# Patient Record
Sex: Female | Born: 1980
Health system: Southern US, Community
[De-identification: ages and names within clinical notes are randomized; demographics above are authoritative.]

## PROBLEM LIST (undated history)

## (undated) DIAGNOSIS — T4145XA Adverse effect of unspecified anesthetic, initial encounter: Secondary | ICD-10-CM

## (undated) DIAGNOSIS — Z8489 Family history of other specified conditions: Secondary | ICD-10-CM

## (undated) DIAGNOSIS — R112 Nausea with vomiting, unspecified: Secondary | ICD-10-CM

## (undated) DIAGNOSIS — N946 Dysmenorrhea, unspecified: Secondary | ICD-10-CM

## (undated) DIAGNOSIS — K589 Irritable bowel syndrome without diarrhea: Secondary | ICD-10-CM

## (undated) DIAGNOSIS — N979 Female infertility, unspecified: Secondary | ICD-10-CM

## (undated) DIAGNOSIS — T8859XA Other complications of anesthesia, initial encounter: Secondary | ICD-10-CM

## (undated) DIAGNOSIS — D649 Anemia, unspecified: Secondary | ICD-10-CM

## (undated) DIAGNOSIS — E221 Hyperprolactinemia: Secondary | ICD-10-CM

## (undated) DIAGNOSIS — Z9889 Other specified postprocedural states: Secondary | ICD-10-CM

## (undated) DIAGNOSIS — F419 Anxiety disorder, unspecified: Secondary | ICD-10-CM

## (undated) DIAGNOSIS — O24419 Gestational diabetes mellitus in pregnancy, unspecified control: Secondary | ICD-10-CM

## (undated) DIAGNOSIS — J45909 Unspecified asthma, uncomplicated: Secondary | ICD-10-CM

## (undated) DIAGNOSIS — N809 Endometriosis, unspecified: Secondary | ICD-10-CM

## (undated) HISTORY — DX: Irritable bowel syndrome, unspecified: K58.9

## (undated) HISTORY — PX: OTHER SURGICAL HISTORY: SHX169

## (undated) HISTORY — DX: Female infertility, unspecified: N97.9

## (undated) HISTORY — DX: Anemia, unspecified: D64.9

## (undated) HISTORY — PX: LAPAROSCOPY: SHX197

## (undated) HISTORY — PX: GANGLION CYST EXCISION: SHX1691

## (undated) HISTORY — DX: Dysmenorrhea, unspecified: N94.6

## (undated) HISTORY — DX: Anxiety disorder, unspecified: F41.9

## (undated) HISTORY — DX: Unspecified asthma, uncomplicated: J45.909

## (undated) HISTORY — DX: Endometriosis, unspecified: N80.9

## (undated) HISTORY — DX: Hyperprolactinemia: E22.1

## (undated) HISTORY — DX: Gestational diabetes mellitus in pregnancy, unspecified control: O24.419

---
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
## 2010-03-22 ENCOUNTER — Ambulatory Visit: Payer: Self-pay

## 2010-11-21 HISTORY — PX: CHROMOPERTUBATION: SHX6288

## 2011-02-14 ENCOUNTER — Ambulatory Visit: Payer: Self-pay | Admitting: General Practice

## 2011-04-11 ENCOUNTER — Ambulatory Visit: Payer: Self-pay | Admitting: Obstetrics and Gynecology

## 2011-04-14 ENCOUNTER — Ambulatory Visit: Payer: Self-pay | Admitting: Obstetrics and Gynecology

## 2011-12-15 ENCOUNTER — Encounter: Payer: Self-pay | Admitting: Maternal and Fetal Medicine

## 2011-12-31 IMAGING — US US PELV - US TRANSVAGINAL
1 series · 17 of 25 positions shown · non-contrast
Comparison: none

REASON FOR EXAM: STAT CR [id number]  pelvic pain unusual menstral cycle
COMMENTS:

PROCEDURE:     US  - US PELVIS EXAM W/TRANSVAGINAL  - February 14, 2011  [DATE]
RESULT:     Pelvic ultrasound dated 02/14/2011.
Transabdominal and endovaginal imaging of the pelvis was obtained.

[Series 1: us pelv - us transvaginal · 17 of 35 slices shown]
[im 1/35]
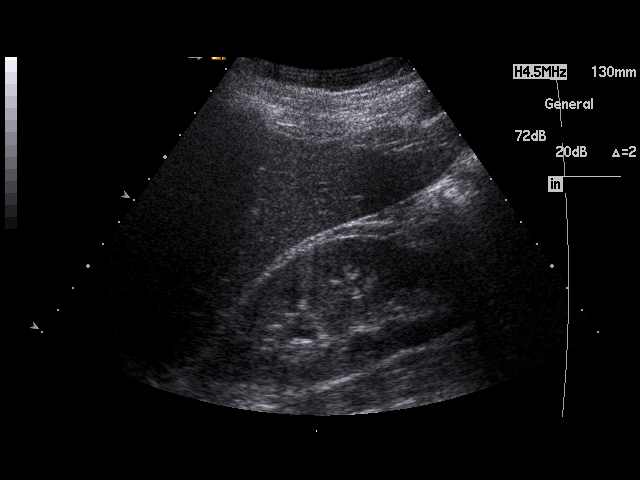
[im 3/35]
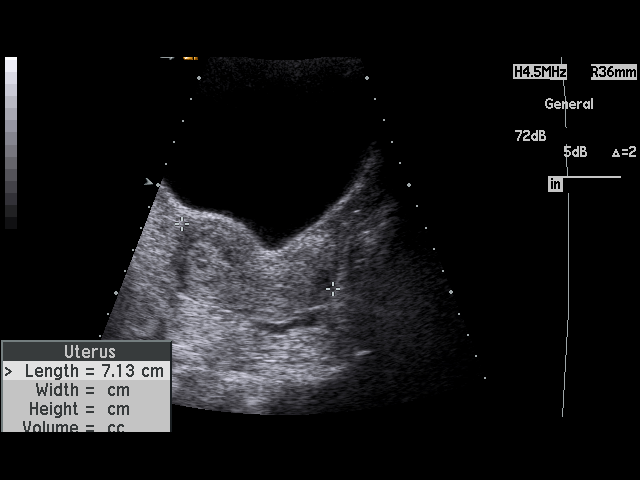
[im 5/35]
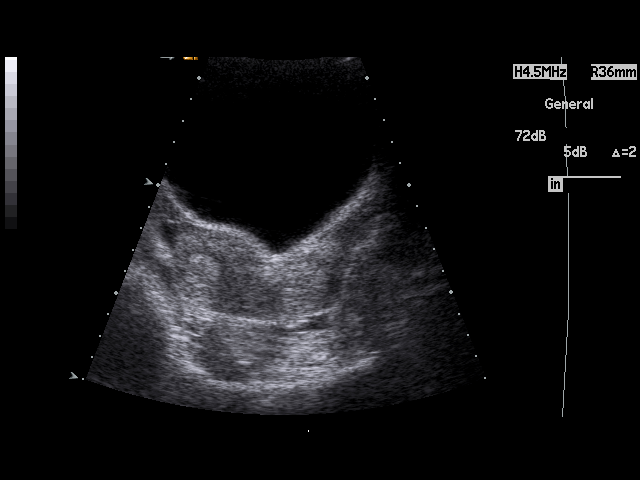
[im 8/35]
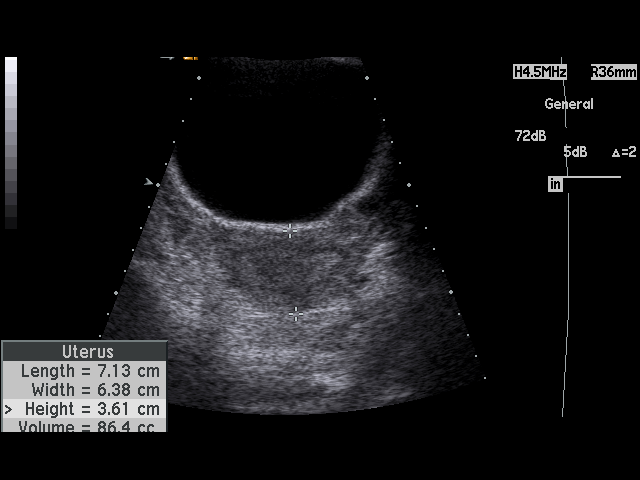
[im 9/35]
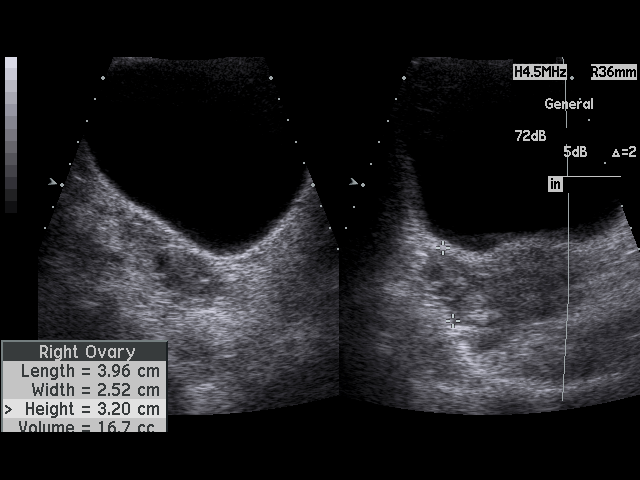
[im 12/35]
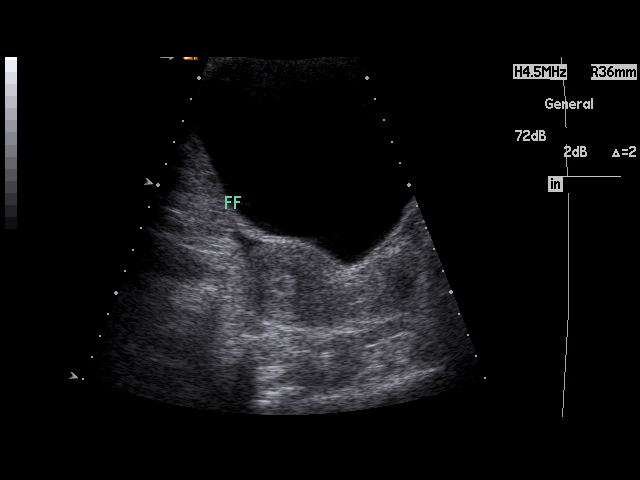
[im 13/35]
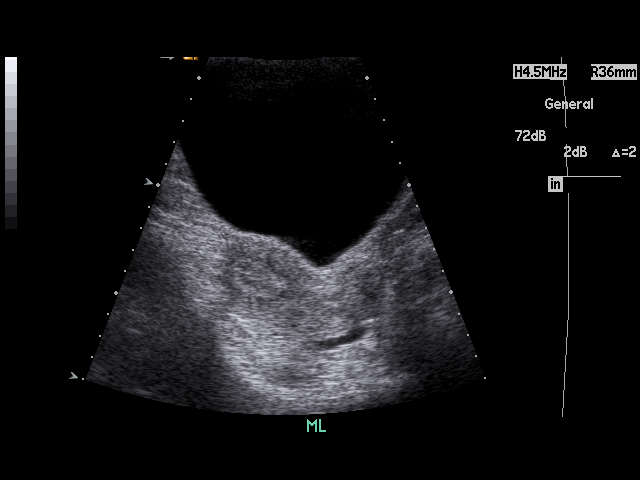
[im 16/35]
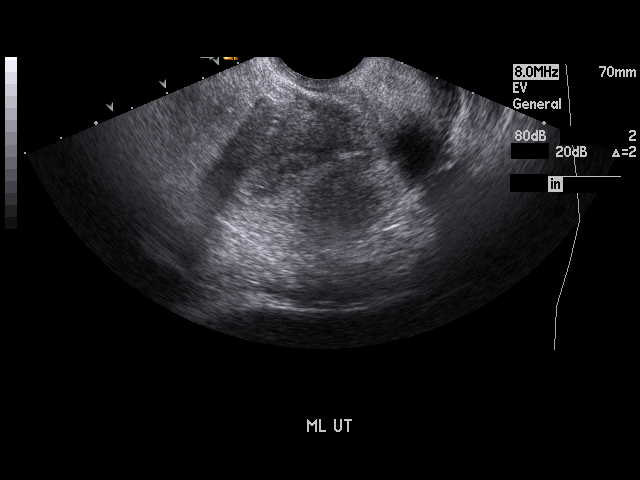
[im 18/35]
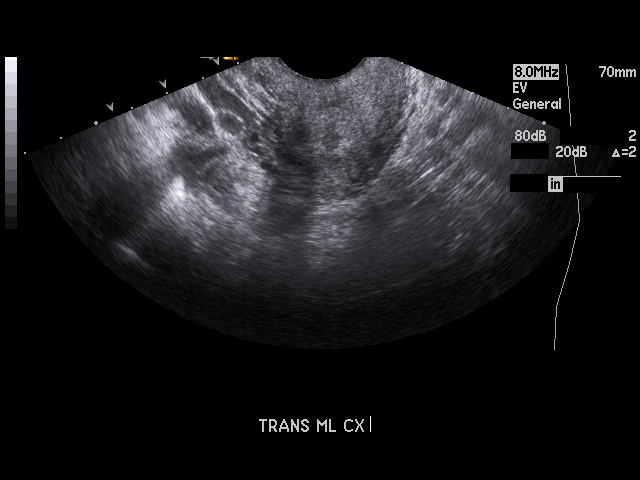
[im 19/35]
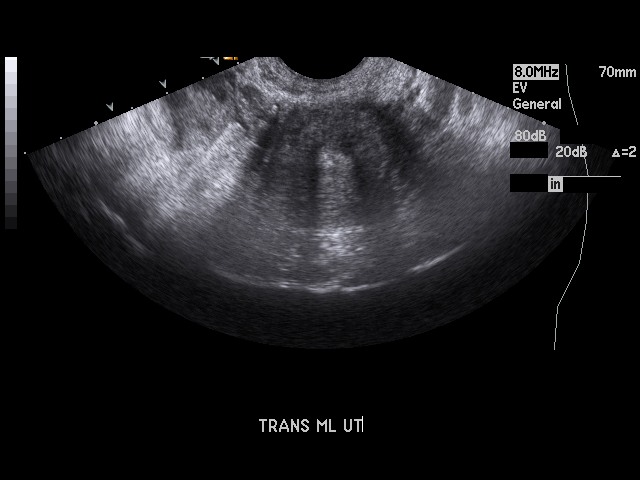
[im 22/35]
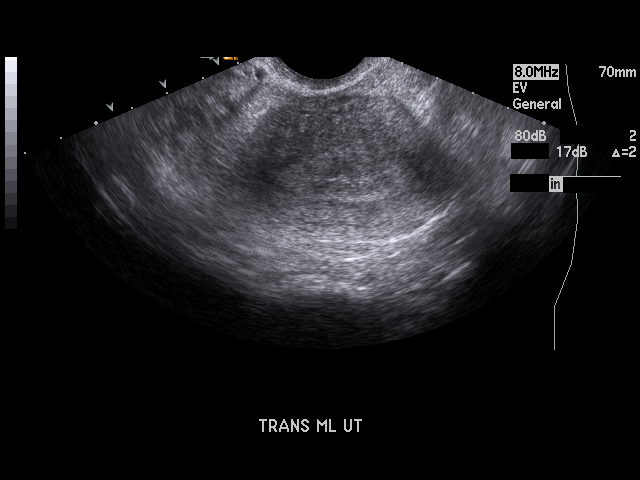
[im 23/35]
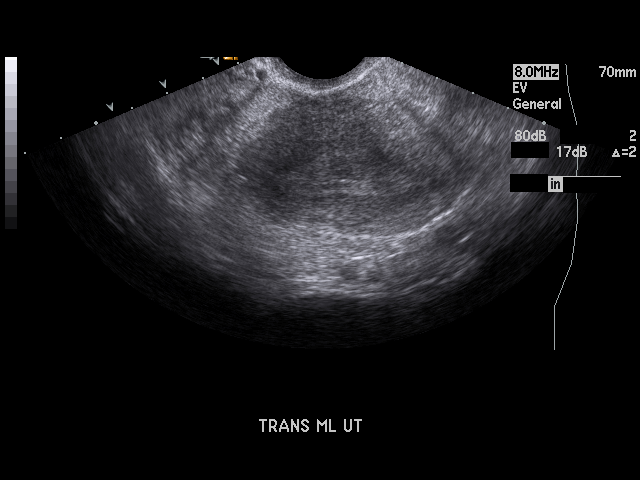
[im 26/35]
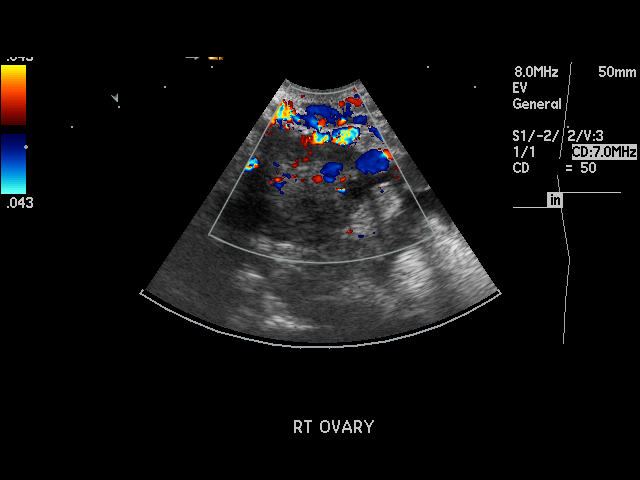
[im 27/35]
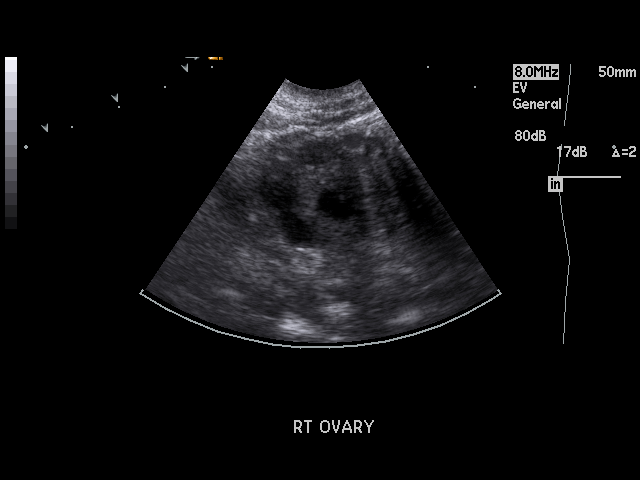
[im 30/35]
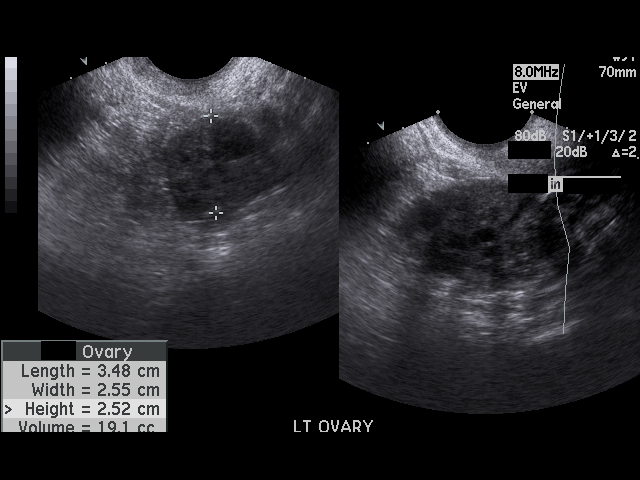
[im 32/35]
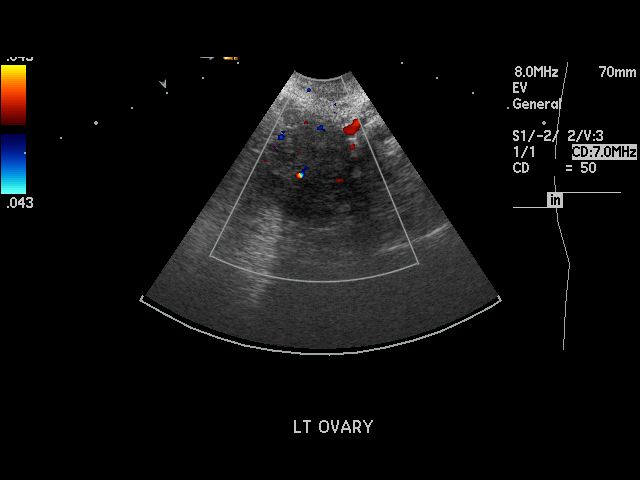
[im 35/35]
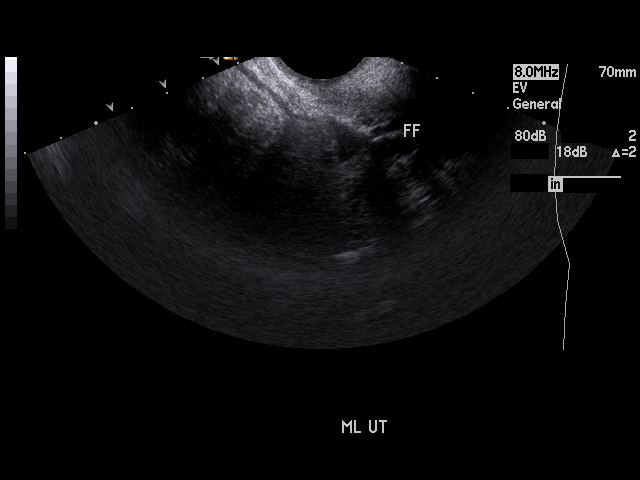

[17 of 25 positions shown; findings below may reference images not displayed]

FINDINGS: The uterus demonstrates a homogeneous is echotexture measuring
7.17 x 6.38 x 2.6 cm. The endometrial thickness is one 0.3 cm. There is a
focal central area of mild heterogeneity within the endometrial canal. This
may represent a component of blood particularly if the patient and is
actively menstruating. The right ovary measures 3.19 x 2.49 x 2.75 cm and
the left 4 x 2.62 x 2.46 cm. The ovaries demonstrate a homogeneous
echotexture. Color flow is identified within the right left ovaries. A trace
amount of fluid is identified within the cul-de-sac. There is no evidence of
hydronephrosis. Note not mentioned above follicles are identified within the
right and left ovaries.
IMPRESSION: Likely physiologic findings as described above the mild
heterogeneity within the endometrium may represent a component of small
bowel blood components or possibly simply physiologic endometrial changes
pre-, post, or during ministration.
2. Small amount of fluid within the cul-de-sac again likely physiologic.

## 2012-01-23 ENCOUNTER — Encounter: Payer: Self-pay | Admitting: Maternal and Fetal Medicine

## 2012-04-09 ENCOUNTER — Encounter: Payer: Self-pay | Admitting: Maternal and Fetal Medicine

## 2012-04-16 ENCOUNTER — Ambulatory Visit: Payer: Self-pay | Admitting: Obstetrics and Gynecology

## 2012-04-21 ENCOUNTER — Ambulatory Visit: Payer: Self-pay | Admitting: Obstetrics and Gynecology

## 2012-05-07 ENCOUNTER — Observation Stay: Payer: Self-pay | Admitting: Obstetrics and Gynecology

## 2012-05-07 LAB — URINALYSIS, COMPLETE
Bilirubin,UR: NEGATIVE
Blood: NEGATIVE
Glucose,UR: NEGATIVE mg/dL (ref 0–75)
Ketone: NEGATIVE
Protein: NEGATIVE
RBC,UR: <1 /HPF (ref 0–5)
Squamous Epithelial: 1
WBC UR: <1 /HPF (ref 0–5)

## 2012-05-07 LAB — FETAL FIBRONECTIN
Appearance: NORMAL
Fetal Fibronectin: NEGATIVE

## 2012-05-09 LAB — URINE CULTURE

## 2012-05-21 ENCOUNTER — Ambulatory Visit: Payer: Self-pay | Admitting: Obstetrics and Gynecology

## 2012-06-17 ENCOUNTER — Inpatient Hospital Stay: Payer: Self-pay | Admitting: Obstetrics and Gynecology

## 2012-06-17 LAB — CBC WITH DIFFERENTIAL/PLATELET
Basophil %: 0.2 %
Eosinophil %: 0.6 %
HCT: 33.2 % — ABNORMAL LOW (ref 35.0–47.0)
HGB: 11.7 g/dL — ABNORMAL LOW (ref 12.0–16.0)
Lymphocyte #: 2 10*3/uL (ref 1.0–3.6)
MCH: 32.5 pg (ref 26.0–34.0)
MCHC: 35.1 g/dL (ref 32.0–36.0)
MCV: 93 fL (ref 80–100)
Monocyte #: 0.9 x10 3/mm (ref 0.2–0.9)
Monocyte %: 9.3 %
Neutrophil #: 6.4 10*3/uL (ref 1.4–6.5)
Neutrophil %: 68.8 %
RBC: 3.59 10*6/uL — ABNORMAL LOW (ref 3.80–5.20)
WBC: 9.3 10*3/uL (ref 3.6–11.0)

## 2012-06-19 LAB — HEMATOCRIT: HCT: 29.4 % — ABNORMAL LOW (ref 35.0–47.0)

## 2012-07-27 ENCOUNTER — Ambulatory Visit: Payer: Self-pay | Admitting: Obstetrics and Gynecology

## 2012-08-21 ENCOUNTER — Ambulatory Visit: Payer: Self-pay | Admitting: Obstetrics and Gynecology

## 2012-11-07 ENCOUNTER — Ambulatory Visit: Payer: Self-pay | Admitting: General Practice

## 2015-02-06 ENCOUNTER — Ambulatory Visit: Payer: Self-pay | Admitting: Specialist

## 2015-02-25 LAB — HM PAP SMEAR: HM PAP: NEGATIVE

## 2015-03-10 NOTE — Op Note (Signed)
PATIENT NAME:  Diana Brooks, Diana Brooks MR#:  [id number] DATE OF BIRTH:  1981/02/28  DATE OF PROCEDURE:  06/18/2012  PREOPERATIVE DIAGNOSES:  1. 39.4 week intrauterine pregnancy, undelivered.  2. Gestational diabetes mellitus, diet controlled.  3. GBS positive bacteriuria.  4. Rh negative.  5. Anemia.   POSTOPERATIVE DIAGNOSES:  1. 39.4 week intrauterine pregnancy, delivered.  2. Gestational diabetes mellitus, diet controlled.  3. GBS bacteriuria, status post treatment in labor.  4. Rh negative.  5. Anemia.  6. Arrest of dilation.  7. Viable female infant 8 pounds 10 ounces.   OPERATIVE PROCEDURE: Primary low cervical transverse cesarean section.   SURGEON: Alanda Slim. Angelina Neece, MD  FIRST ASSISTANT: None.   ANESTHESIA: Spinal.   INDICATIONS: Patient is a 34 year old married white female gravida 1, para 0, EGA 39.4 weeks, who was admitted for induction of labor secondary to gestational diabetes mellitus and favorable cervix. Patient had other chronic issues as noted in the diagnoses. Patient underwent Cytotec/Pitocin induction of labor with subsequent amniotomy and introduction of IUPC. Patient developed adequate labor through [facility] unit assessment but had arrest of dilation at 7 cm, 90% effaced, -1 station. Despite adequate contractions she failed to progress over several hours and proceeded to move forward with cesarean section delivery.   FINDINGS AT SURGERY: Viable female infant 8 pounds 10 ounces having Apgars of 8 and 9 at one and five minutes, respectively. Uterus is notable for a 2 cm right fundal fibroid and uterine atony which responded to IM Methergine and PR Cytotec. Tubes and ovaries were grossly normal.   DESCRIPTION OF PROCEDURE: Patient was brought to the Operating Room where she was placed in the sitting position. Spinal anesthetic was introduced without difficulty. She was placed in the supine position with a right lateral hip roll in place. A ChloraPrep abdominal prep and  drape was performed in the standard fashion. A Foley catheter was draining clear yellow urine from the bladder. After checking for adequate level of anesthesia, a Pfannenstiel incision was made to the abdomen. The fascia was incised transversely and extended bilaterally with Mayo scissors. The midline raphe was incised, separated and the peritoneum was entered. Bladder flap was created through sharp dissection with Metzenbaum. A low transverse incision was made in the uterus and this was extended bluntly bilaterally. The infant was delivered through a vertex presentation and was vigorous at birth. Oropharynx, nasopharynx were bulb suctioned. The umbilical cord was triply clamped and cut and the infant was handed off to the awaiting resuscitation team. Cord blood sampling was obtained. Placenta was expressed from the uterus. The uterus was externalized onto the anterior abdominal wall. It was cleared of all debris with laps. Uterine atony was encountered and the patient was then given 0.2 mg of Methergine IM as well as 1000 mcg of Cytotec PR which improved the status of uterine atony. The uterine incision was closed in two layers using #1 chromic suture. The first layer was a running locking stitch, second layer was an imbricating layer. Bladder flap was reapproximated using 2-0 Vicryl suture in a simple running manner. After assessment of the pelvis and check on hemostasis the uterus was placed back into the abdominopelvic cavity. Gutters were cleared of all debris with laps. The incision was closed in layers with 0 Maxon being used on the fascia in a simple running manner. Skin was closed with staples. Pressure dressing was applied. Patient was then mobilized and taken to recovery room in satisfactory condition.   ESTIMATED BLOOD LOSS: 750 mL.  IV FLUIDS INFUSED: 1000 mL.    ____________________________ Alanda Slim. Efrem Pitstick, MD mad:cms D: 06/19/2012 12:34:01 ET T: 06/19/2012 12:54:38  ET JOB#: [id number]  cc: Hassell Done A. Azzure Garabedian, MD, <Dictator>  Alanda Slim Jajuan Skoog MD ELECTRONICALLY SIGNED 06/25/2012 14:22

## 2015-03-16 LAB — SURGICAL PATHOLOGY

## 2015-03-22 NOTE — Op Note (Signed)
PATIENT NAME:  Diana Brooks, DITMER MR#:  [id number] DATE OF BIRTH:  Apr 19, 1981  DATE OF PROCEDURE:  02/06/2015  PREOPERATIVE DIAGNOSIS: Dorsal left wrist ganglion.   POSTOPERATIVE DIAGNOSIS: Dorsal left wrist ganglion.   PROCEDURE: Excision of dorsal left wrist ganglion.   SURGEON: Christophe Louis, M.D.   ANESTHESIA: General.   COMPLICATIONS: None.   TOURNIQUET TIME: Approximately 30 minutes.   DESCRIPTION OF PROCEDURE: One gram of Ancef was given intravenously prior to the procedure. General anesthesia is induced. The left upper extremity is thoroughly prepped with alcohol and ChloraPrep and draped in standard sterile fashion. The extremity is wrapped out with the Esmarch bandage and pneumatic tourniquet elevated to 250 mmHg. Under loupe magnification, transverse incision is made at the wrist over the prominence of the ganglion and the dissection carried down to the retinaculum. The ganglion is seen to be protruding between the extensor pollicis longus and the common extensor tendons. These are reflected to each side with a retractor and preserved throughout the case. The ganglion sac is then completely dissected out under loupe magnification down to the dorsal wrist. A 1 cm in diameter portion of dorsal wrist capsule is removed along with the associated ganglion. The rongeur is used to remove any residual pieces of the ganglion sac. The wound is thoroughly irrigated multiple times. The retinaculum is repaired with several 5-0 Vicryl sutures. One subcutaneous 5-0 Vicryl suture is placed. The skin is closed with a running subcuticular 3-0 Prolene. Skin edges are infiltrated with 0.5% plain Xylocaine. A soft bulky dressing with a volar splint is applied. The patient is returned to the recovery room in satisfactory condition having tolerated the procedure quite well. ____________________________ Lucas Mallow, MD ces:sb D: 02/06/2015 08:47:39 ET T: 02/06/2015 08:57:05  ET JOB#: [id number]  cc: Lucas Mallow, MD, <Dictator> Lucas Mallow MD ELECTRONICALLY SIGNED 02/14/2015 13:03

## 2015-03-31 NOTE — H&P (Signed)
L&D Evaluation:  History:   HPI 34 year old white female G1P0 at 39.[redacted] weeks gestation, estimated date of confinement 06/22/2012, presents for Cytotec/Pitocin IOL secondary to GDM, favorable cervix.    Patient's Medical History GDM; Anemia; GBS Bacteruria; Endometriosis; EIA; IBS; Hyperprolactinemia    Patient's Surgical History Laparoscopic, Chromopertubation of Fallopian Tubes    Medications Pre Natal Vitamins    Allergies NKDA    Social History none    Family History Non-Contributory   ROS:   ROS All systems were reviewed.  HEENT, CNS, GI, GU, Respiratory, CV, Renal and Musculoskeletal systems were found to be normal.   Exam:   Vital Signs stable    Urine Protein negative dipstick    General no apparent distress    Mental Status clear    Chest clear    Heart normal sinus rhythm    Abdomen gravid, non-tender    Estimated Fetal Weight Average for gestational age, 8#6    Back no CVAT    Edema no edema    Pelvic no external lesions, 1-2/80/-2/VTX/bowi    FHT normal rate with no decels    Ucx regular    Skin dry    Lymph no lymphadenopathy    Other O-/Atb-/NR/RI/VI/HB-/HIV-/GBS+/Glucola 186/3 Hr GTT Abnormal   Impression:   Impression YIUP; Rh-; GBS Bacteruria; GDM   Plan:   Plan antibiotics for GBBS prophylaxis, Cytotec/Pitocin IOL    Comments Epidural prn   Electronic Signatures: Gunter Conde, Alanda Slim (MD)  (Signed 29-Jul-13 13:59)  Authored: L&D Evaluation   Last Updated: 29-Jul-13 13:59 by Lachanda Buczek, Alanda Slim (MD)

## 2015-10-20 ENCOUNTER — Ambulatory Visit: Payer: Self-pay | Admitting: Obstetrics and Gynecology

## 2015-10-22 ENCOUNTER — Encounter: Payer: Self-pay | Admitting: Obstetrics and Gynecology

## 2015-10-22 ENCOUNTER — Ambulatory Visit (INDEPENDENT_AMBULATORY_CARE_PROVIDER_SITE_OTHER): Payer: BLUE CROSS/BLUE SHIELD | Admitting: Obstetrics and Gynecology

## 2015-10-22 VITALS — BP 124/76 | HR 80 | Ht 64.0 in | Wt 160.0 lb

## 2015-10-22 DIAGNOSIS — N6009 Solitary cyst of unspecified breast: Secondary | ICD-10-CM | POA: Insufficient documentation

## 2015-10-22 DIAGNOSIS — N979 Female infertility, unspecified: Secondary | ICD-10-CM | POA: Diagnosis not present

## 2015-10-22 DIAGNOSIS — N809 Endometriosis, unspecified: Secondary | ICD-10-CM | POA: Insufficient documentation

## 2015-10-22 DIAGNOSIS — N6002 Solitary cyst of left breast: Secondary | ICD-10-CM

## 2015-10-22 NOTE — Progress Notes (Signed)
Patient ID: Diana Brooks, female   DOB: 1981/01/21, 34 y.o.   MRN: [id number] Infertility x 13 months H/o of endometrioisis Regular cycles- q 28days- last 5-6, 2-3 heavy- chanigng q 2 hours Cyst on left breast - tender  GYN ENCOUNTER NOTE  Subjective:       RALPH BRISKEY is a 34 y.o. G23P1001 female is here for gynecologic evaluation of the following issues:  1. Left nipple cyst. 2.  History of endometriosis. 3.  Secondary infertility   Gynecologic History Patient's last menstrual period was 10/17/2015 (exact date). Contraception: None Cycle interval 24-28 days  Obstetric History OB History  Gravida Para Term Preterm AB SAB TAB Ectopic Multiple Living  1 1 1       1     # Outcome Date GA Lbr Len/2nd Weight Sex Delivery Anes PTL Lv  1 Term    8 lb 1.6 oz (3.674 kg) M CS-LTranv   Y      Past Medical History  Diagnosis Date  . Asthma     exercise induced  . IBS (irritable bowel syndrome)   . Hyperprolactinemia ([place])   . Anemia   . Endometriosis   . Dysmenorrhea   . Anxiety   . GDM (gestational diabetes mellitus)   . Infertility, female     Past Surgical History  Procedure Laterality Date  . Wrist srugery      ganglion cyst removed  . Cesarean section    . Laparoscopy      lap with excision of fulguration of endometriosis    Current Outpatient Prescriptions on File Prior to Visit  Medication Sig Dispense Refill  . acetaminophen (TYLENOL) 500 MG tablet Take 500 mg by mouth every 6 (six) hours as needed.    Marland Kitchen albuterol (PROVENTIL HFA;VENTOLIN HFA) 108 (90 BASE) MCG/ACT inhaler Inhale into the lungs every 6 (six) hours as needed for wheezing or shortness of breath.    . Multiple Vitamin (MULTIVITAMIN) tablet Take 1 tablet by mouth daily.     No current facility-administered medications on file prior to visit.    Allergies  Allergen Reactions  . Levaquin [Levofloxacin In D5w]   . Sulfa Antibiotics   . Suprax [Cefixime]     Social History   Social  History  . Marital Status: Married    Spouse Name: N/A  . Number of Children: N/A  . Years of Education: N/A   Occupational History  . Not on file.   Social History Main Topics  . Smoking status: Never Smoker   . Smokeless tobacco: Not on file  . Alcohol Use: Yes     Comment: rare  . Drug Use: No  . Sexual Activity: Yes    Birth Control/ Protection: None   Other Topics Concern  . Not on file   Social History Narrative    Family History  Problem Relation Age of Onset  . Endometriosis Mother   . Diabetes Paternal Grandfather   . Heart disease Neg Hx   . Breast cancer Neg Hx   . Ovarian cancer Neg Hx     The following portions of the patient's history were reviewed and updated as appropriate: allergies, current medications, past family history, past medical history, past social history, past surgical history and problem list.  Review of Systems Review of Systems - General ROS: negative for - chills, fatigue, fever, hot flashes, malaise or night sweats Hematological and Lymphatic ROS: negative for - bleeding problems or swollen lymph nodes Gastrointestinal ROS: negative for -  abdominal pain, blood in stools, change in bowel habits and nausea/vomiting Musculoskeletal ROS: negative for - joint pain, muscle pain or muscular weakness Genito-Urinary ROS: negative for - change in menstrual cycle, dysmenorrhea, dyspareunia, dysuria, genital discharge, genital ulcers, hematuria, incontinence, irregular/heavy menses, nocturia or pelvic painjj  Objective:   BP 124/76 mmHg  Pulse 80  Ht 5\' 4"  (1.626 m)  Wt 160 lb (72.576 kg)  BMI 27.45 kg/m2  LMP 10/17/2015 (Exact Date) CONSTITUTIONAL: Well-developed, well-nourished female in no acute distress.  HENT:  Normocephalic, atraumatic.  SKIN: Skin is warm and dry. No rash noted. Not diaphoretic. No erythema. No pallor. [place]: Alert and oriented to person, place, and time. PSYCHIATRIC: Normal mood and affect. Normal behavior. Normal  judgment and thought content. CARDIOVASCULAR:Not Examined RESPIRATORY: Not Examined BREASTS: Ecchymoses over upper left thorax subclavicular region (horse bite [place]).  Left nipple with 4 mm inclusion cyst/blocked duct, superficial. ABDOMEN: Soft, non distended; Non tender.  No Organomegaly. PELVIC:  External Genitalia: Normal  BUS: Normal  Vagina: Normal  Cervix: Normal; Minimal cervical motion tenderness  Uterus: Normal size, shape,consistency, mobile; Minimal tenderness  Adnexa: Normal  RV: Normal External exam  Bladder: Nontender MUSCULOSKELETAL: Normal range of motion. No tenderness.  No cyanosis, clubbing, or edema.     Assessment:   1. Infertility, female, secondary Cycles 24-28 days  2. Cyst of nipple, left Suspect inclusion cyst versus blocked duct  3. Endometriosis History of moderate disease with one tube blocked.     Plan:   1.  Laparoscopy with peritoneal biopsies, J plasma treatment of endometriosis implants, and chromopertubation 2.  Return as needed if patient desires inclusion cyst drainage/removal. 3.  Return for preop appointment  A total of 25 minutes were spent face-to-face with the patient during this encounter and over half of that time involved counseling and coordination of care.  Brayton Mars, MD  Note: This dictation was prepared with Dragon dictation along with smaller phrase technology. Any transcriptional errors that result from this process are unintentional.

## 2015-10-22 NOTE — Patient Instructions (Signed)
1.  Laparoscopy with peritoneal biopsies and chromopertubation of fallopian tubes is to be scheduled today for the next 4-6 weeks surgical date. 2.  Please let us know when day 22 Cycle day is so that we may get a serum progesterone test To confirm ovulation. 3.  Return for preop appointment.

## 2015-11-06 ENCOUNTER — Other Ambulatory Visit: Payer: BLUE CROSS/BLUE SHIELD

## 2015-11-06 ENCOUNTER — Other Ambulatory Visit: Payer: Self-pay | Admitting: Obstetrics and Gynecology

## 2015-11-06 NOTE — Addendum Note (Signed)
Addended by: Elouise Munroe on: 11/06/2015 11:31 AM   Modules accepted: Orders

## 2015-11-07 LAB — PROGESTERONE: PROGESTERONE: 12.7 ng/mL

## 2015-11-26 ENCOUNTER — Other Ambulatory Visit: Payer: Self-pay

## 2015-11-26 ENCOUNTER — Encounter: Payer: BLUE CROSS/BLUE SHIELD | Admitting: Obstetrics and Gynecology

## 2016-01-05 ENCOUNTER — Ambulatory Visit (INDEPENDENT_AMBULATORY_CARE_PROVIDER_SITE_OTHER): Payer: BLUE CROSS/BLUE SHIELD | Admitting: Obstetrics and Gynecology

## 2016-01-05 ENCOUNTER — Other Ambulatory Visit: Payer: Self-pay

## 2016-01-05 ENCOUNTER — Encounter: Payer: Self-pay | Admitting: Obstetrics and Gynecology

## 2016-01-05 ENCOUNTER — Encounter: Payer: Self-pay | Admitting: *Deleted

## 2016-01-05 VITALS — BP 101/63 | HR 87 | Ht 64.0 in | Wt 157.8 lb

## 2016-01-05 DIAGNOSIS — N839 Noninflammatory disorder of ovary, fallopian tube and broad ligament, unspecified: Secondary | ICD-10-CM

## 2016-01-05 DIAGNOSIS — N979 Female infertility, unspecified: Secondary | ICD-10-CM

## 2016-01-05 DIAGNOSIS — N809 Endometriosis, unspecified: Secondary | ICD-10-CM

## 2016-01-05 NOTE — Progress Notes (Signed)
Subjective:  Preoperative history and physical    Patient is a 35 y.o. G1P1082female scheduled for laparoscopy with excision and fulguration of endometriosis; chromopertubation of fallopian tubes. Indications for procedure are history of moderate to severe endometriosis and blocked fallopian tube; secondary infertility.  Previous laparoscopy demonstrated moderate to severe endometriosis and one fallopian tube, which was blocked.  Currently patient is not having any significant dysmenorrhea or dyspareunia; however, periods are heavier with clots.   Pertinent Gynecological History: Menses: regular Bleeding:heavy Contraception:none Last mammogram: NA Last pap: normal  HISTORY OF MODERATE TO SEVERE ENDOMETRIOSIS  Discussed Blood/Blood Products: no   Menstrual History: OB History    Gravida Para Term Preterm AB TAB SAB Ectopic Multiple Living   1 1 1       1       Menarche age: NA  Patient's last menstrual period was 12/30/2015 (exact date).    Past Medical History  Diagnosis Date  . Asthma     exercise induced  . IBS (irritable bowel syndrome)   . Hyperprolactinemia ([place])   . Anemia   . Endometriosis   . Dysmenorrhea   . Anxiety   . GDM (gestational diabetes mellitus)   . Infertility, female     Past Surgical History  Procedure Laterality Date  . Wrist srugery      ganglion cyst removed  . Cesarean section    . Laparoscopy      lap with excision of fulguration of endometriosis    OB History  Gravida Para Term Preterm AB SAB TAB Ectopic Multiple Living  1 1 1       1     # Outcome Date GA Lbr Len/2nd Weight Sex Delivery Anes PTL Lv  1 Term    8 lb 1.6 oz (3.674 kg) M CS-LTranv   Y      Social History   Social History  . Marital Status: Married    Spouse Name: N/A  . Number of Children: N/A  . Years of Education: N/A   Social History Main Topics  . Smoking status: Never Smoker   . Smokeless tobacco: None  . Alcohol Use: Yes     Comment: rare  . Drug  Use: No  . Sexual Activity: Yes    Birth Control/ Protection: None   Other Topics Concern  . None   Social History Narrative    Family History  Problem Relation Age of Onset  . Endometriosis Mother   . Diabetes Paternal Grandfather   . Heart disease Neg Hx   . Breast cancer Neg Hx   . Ovarian cancer Neg Hx   . Prostate cancer Maternal Uncle      (Not in a hospital admission)  Allergies  Allergen Reactions  . Levaquin [Levofloxacin In D5w]   . Sulfa Antibiotics   . Suprax [Cefixime]     Review of Systems Constitutional: No recent fever/chills/sweats Respiratory: No recent cough/bronchitis Cardiovascular: No chest pain Gastrointestinal: No recent nausea/vomiting/diarrhea Genitourinary: No UTI symptoms Hematologic/lymphatic:No history of coagulopathy or recent blood thinner use    Objective:    BP 101/63 mmHg  Pulse 87  Ht 5\' 4"  (1.626 m)  Wt 157 lb 12.8 oz (71.578 kg)  BMI 27.07 kg/m2  LMP 12/30/2015 (Exact Date)  General:   Normal  Skin:   normal  HEENT:  Normal  Neck:  Supple without Adenopathy or Thyromegaly  Lungs:   Heart:              Breasts:  Abdomen:  Pelvis:  M/S   Extremeties:  Neuro:    clear to auscultation bilaterally   Normal without murmur   Not Examined   soft, non-tender; bowel sounds normal; no masses,  no organomegaly   Exam deferred to OR  No CVAT  Warm/Dry   Normal          Assessment:  1.  Endometriosis, moderate to severe. 2.  Blocked fallopian tube. 3.  Secondary infertility  Plan:  1.  Laparoscopy with excision and fulguration of endometriosis. 2.  Chromopertubation of fallopian tubes    Preop counseling: The patient is to undergo laparoscopy with excision and fulguration of endometriosis with the use of J plasma as well as chromopertubation of fallopian tubes.  The patient is understanding of the planned procedure and is aware of and is accepting of all surgical risks which include but are not limited to  bleeding, infection, pelvic organ injury, need for repair, blood clot disorders, anesthesia risks, etc.  All questions have been answered.  Informed consent is given.  Patient is ready and willing to proceed with surgery as scheduled.  Brayton Mars, MD  Note: This dictation was prepared with Dragon dictation along with smaller phrase technology. Any transcriptional errors that result from this process are unintentional.

## 2016-01-05 NOTE — H&P (Signed)
Subjective:  Preoperative history and physical    Patient is a 35 y.o. G1P1079female scheduled for laparoscopy with excision and fulguration of endometriosis; chromopertubation of fallopian tubes. Indications for procedure are history of moderate to severe endometriosis and blocked fallopian tube; secondary infertility.  Previous laparoscopy demonstrated moderate to severe endometriosis and one fallopian tube, which was blocked.  Currently patient is not having any significant dysmenorrhea or dyspareunia; however, periods are heavier with clots.   Pertinent Gynecological History: Menses: regular Bleeding:heavy Contraception:none Last mammogram: NA Last pap: normal  HISTORY OF MODERATE TO SEVERE ENDOMETRIOSIS  Discussed Blood/Blood Products: no   Menstrual History: OB History    Gravida Para Term Preterm AB TAB SAB Ectopic Multiple Living   1 1 1       1       Menarche age: NA  Patient's last menstrual period was 12/30/2015 (exact date).    Past Medical History  Diagnosis Date  . Asthma     exercise induced  . IBS (irritable bowel syndrome)   . Hyperprolactinemia ([place])   . Anemia   . Endometriosis   . Dysmenorrhea   . Anxiety   . GDM (gestational diabetes mellitus)   . Infertility, female     Past Surgical History  Procedure Laterality Date  . Wrist srugery      ganglion cyst removed  . Cesarean section    . Laparoscopy      lap with excision of fulguration of endometriosis    OB History  Gravida Para Term Preterm AB SAB TAB Ectopic Multiple Living  1 1 1       1     # Outcome Date GA Lbr Len/2nd Weight Sex Delivery Anes PTL Lv  1 Term    8 lb 1.6 oz (3.674 kg) M CS-LTranv   Y      Social History   Social History  . Marital Status: Married    Spouse Name: N/A  . Number of Children: N/A  . Years of Education: N/A   Social History Main Topics  . Smoking status: Never Smoker   . Smokeless tobacco: None  . Alcohol Use: Yes     Comment: rare  . Drug  Use: No  . Sexual Activity: Yes    Birth Control/ Protection: None   Other Topics Concern  . None   Social History Narrative    Family History  Problem Relation Age of Onset  . Endometriosis Mother   . Diabetes Paternal Grandfather   . Heart disease Neg Hx   . Breast cancer Neg Hx   . Ovarian cancer Neg Hx   . Prostate cancer Maternal Uncle      (Not in a hospital admission)  Allergies  Allergen Reactions  . Levaquin [Levofloxacin In D5w]   . Sulfa Antibiotics   . Suprax [Cefixime]     Review of Systems Constitutional: No recent fever/chills/sweats Respiratory: No recent cough/bronchitis Cardiovascular: No chest pain Gastrointestinal: No recent nausea/vomiting/diarrhea Genitourinary: No UTI symptoms Hematologic/lymphatic:No history of coagulopathy or recent blood thinner use    Objective:    BP 101/63 mmHg  Pulse 87  Ht 5\' 4"  (1.626 m)  Wt 157 lb 12.8 oz (71.578 kg)  BMI 27.07 kg/m2  LMP 12/30/2015 (Exact Date)  General:   Normal  Skin:   normal  HEENT:  Normal  Neck:  Supple without Adenopathy or Thyromegaly  Lungs:   Heart:              Breasts:  Abdomen:  Pelvis:  M/S   Extremeties:  Neuro:    clear to auscultation bilaterally   Normal without murmur   Not Examined   soft, non-tender; bowel sounds normal; no masses,  no organomegaly   Exam deferred to OR  No CVAT  Warm/Dry   Normal          Assessment:  1.  Endometriosis, moderate to severe. 2.  Blocked fallopian tube. 3.  Secondary infertility  Plan:  1.  Laparoscopy with excision and fulguration of endometriosis. 2.  Chromopertubation of fallopian tubes    Preop counseling: The patient is to undergo laparoscopy with excision and fulguration of endometriosis with the use of J plasma as well as chromopertubation of fallopian tubes.  The patient is understanding of the planned procedure and is aware of and is accepting of all surgical risks which include but are not limited to  bleeding, infection, pelvic organ injury, need for repair, blood clot disorders, anesthesia risks, etc.  All questions have been answered.  Informed consent is given.  Patient is ready and willing to proceed with surgery as scheduled.  Brayton Mars, MD  Note: This dictation was prepared with Dragon dictation along with smaller phrase technology. Any transcriptional errors that result from this process are unintentional.

## 2016-01-05 NOTE — Patient Instructions (Signed)
1.  Return in 1 week for postop check 

## 2016-01-05 NOTE — Patient Instructions (Signed)
  Your procedure is scheduled on: 01/11/16 Report to Day Surgery.medical mall second floor To find out your arrival time please call [phone] between 1PM - 3PM on 01/08/16.  Remember: Instructions that are not followed completely may result in serious medical risk, up to and including death, or upon the discretion of your surgeon and anesthesiologist your surgery may need to be rescheduled.    _x_ 1. Do not eat food or drink liquids after midnight. No gum chewing or hard candies.     _x___ 2. No Alcohol for 24 hours before or after surgery.   ____ 3. Bring all medications with you on the day of surgery if instructed.    _x___ 4. Notify your doctor if there is any change in your medical condition     (cold, fever, infections).     Do not wear jewelry, make-up, hairpins, clips or nail polish.  Do not wear lotions, powders, or perfumes. You may wear deodorant.  Do not shave 48 hours prior to surgery. Men may shave face and neck.  Do not bring valuables to the hospital.    [facility] is not responsible for any belongings or valuables.               Contacts, dentures or bridgework may not be worn into surgery.  Leave your suitcase in the car. After surgery it may be brought to your room.  For patients admitted to the hospital, discharge time is determined by your                treatment team.   Patients discharged the day of surgery will not be allowed to drive home.   Please read over the following fact sheets that you were given:   Surgical Site Infection Prevention   ____ Take these medicines the morning of surgery with A SIP OF WATER:    1. none  2.   3.   4.  5.  6.  ____ Fleet Enema (as directed)   _x_ Use CHG Soap as directed  _x___ Use inhalers on the day of surgery  ____ Stop metformin 2 days prior to surgery    ____ Take 1/2 of usual insulin dose the night before surgery and none on the morning of surgery.   ____ Stop Coumadin/Plavix/aspirin on   ____  Stop Anti-inflammatories on    ____ Stop supplements until after surgery.    ____ Bring C-Pap to the hospital.

## 2016-01-05 NOTE — Pre-Procedure Instructions (Signed)
States phenergan helps postop nausea

## 2016-01-06 ENCOUNTER — Encounter
Admission: RE | Admit: 2016-01-06 | Discharge: 2016-01-06 | Disposition: A | Discharge status: Home / Self Care | Payer: BLUE CROSS/BLUE SHIELD | Source: Ambulatory Visit | Attending: Obstetrics and Gynecology | Admitting: Obstetrics and Gynecology

## 2016-01-06 DIAGNOSIS — Z01812 Encounter for preprocedural laboratory examination: Secondary | ICD-10-CM | POA: Diagnosis not present

## 2016-01-06 DIAGNOSIS — N946 Dysmenorrhea, unspecified: Secondary | ICD-10-CM | POA: Diagnosis not present

## 2016-01-06 DIAGNOSIS — N979 Female infertility, unspecified: Secondary | ICD-10-CM | POA: Diagnosis present

## 2016-01-06 DIAGNOSIS — F419 Anxiety disorder, unspecified: Secondary | ICD-10-CM | POA: Insufficient documentation

## 2016-01-06 DIAGNOSIS — D649 Anemia, unspecified: Secondary | ICD-10-CM | POA: Diagnosis not present

## 2016-01-06 DIAGNOSIS — J45909 Unspecified asthma, uncomplicated: Secondary | ICD-10-CM | POA: Insufficient documentation

## 2016-01-06 DIAGNOSIS — N809 Endometriosis, unspecified: Secondary | ICD-10-CM | POA: Diagnosis not present

## 2016-01-06 LAB — CBC WITH DIFFERENTIAL/PLATELET
BASOS PCT: 0 %
Basophils Absolute: 0 10*3/uL (ref 0–0.1)
EOS ABS: 0.3 10*3/uL (ref 0–0.7)
Eosinophils Relative: 3 %
HEMATOCRIT: 38.8 % (ref 35.0–47.0)
HEMOGLOBIN: 13.3 g/dL (ref 12.0–16.0)
LYMPHS ABS: 2 10*3/uL (ref 1.0–3.6)
Lymphocytes Relative: 21 %
MCH: 30.3 pg (ref 26.0–34.0)
MCHC: 34.2 g/dL (ref 32.0–36.0)
MCV: 88.6 fL (ref 80.0–100.0)
Monocytes Absolute: 0.6 10*3/uL (ref 0.2–0.9)
Monocytes Relative: 6 %
NEUTROS ABS: 6.5 10*3/uL (ref 1.4–6.5)
NEUTROS PCT: 70 %
Platelets: 207 10*3/uL (ref 150–440)
RBC: 4.37 MIL/uL (ref 3.80–5.20)
RDW: 13.2 % (ref 11.5–14.5)
WBC: 9.4 10*3/uL (ref 3.6–11.0)

## 2016-01-06 LAB — RAPID HIV SCREEN (HIV 1/2 AB+AG)
HIV 1/2 ANTIBODIES: NONREACTIVE
HIV-1 P24 ANTIGEN - HIV24: NONREACTIVE

## 2016-01-07 LAB — RPR: RPR: NONREACTIVE

## 2016-01-11 ENCOUNTER — Ambulatory Visit: Payer: BLUE CROSS/BLUE SHIELD | Admitting: Anesthesiology

## 2016-01-11 ENCOUNTER — Encounter: Payer: Self-pay | Admitting: Anesthesiology

## 2016-01-11 ENCOUNTER — Encounter
Admission: RE | Disposition: A | Discharge status: Home / Self Care | Payer: Self-pay | Source: Ambulatory Visit | Attending: Obstetrics and Gynecology

## 2016-01-11 ENCOUNTER — Ambulatory Visit
Admission: RE | Admit: 2016-01-11 | Discharge: 2016-01-11 | Disposition: A | Discharge status: Home / Self Care | Payer: BLUE CROSS/BLUE SHIELD | Source: Ambulatory Visit | Attending: Obstetrics and Gynecology | Admitting: Obstetrics and Gynecology

## 2016-01-11 DIAGNOSIS — D252 Subserosal leiomyoma of uterus: Secondary | ICD-10-CM | POA: Diagnosis not present

## 2016-01-11 DIAGNOSIS — Z833 Family history of diabetes mellitus: Secondary | ICD-10-CM | POA: Insufficient documentation

## 2016-01-11 DIAGNOSIS — N978 Female infertility of other origin: Secondary | ICD-10-CM

## 2016-01-11 DIAGNOSIS — J45990 Exercise induced bronchospasm: Secondary | ICD-10-CM | POA: Diagnosis not present

## 2016-01-11 DIAGNOSIS — N809 Endometriosis, unspecified: Secondary | ICD-10-CM

## 2016-01-11 DIAGNOSIS — Z8042 Family history of malignant neoplasm of prostate: Secondary | ICD-10-CM | POA: Insufficient documentation

## 2016-01-11 DIAGNOSIS — N808 Other endometriosis: Secondary | ICD-10-CM | POA: Diagnosis not present

## 2016-01-11 DIAGNOSIS — Z842 Family history of other diseases of the genitourinary system: Secondary | ICD-10-CM | POA: Insufficient documentation

## 2016-01-11 DIAGNOSIS — E221 Hyperprolactinemia: Secondary | ICD-10-CM | POA: Diagnosis not present

## 2016-01-11 DIAGNOSIS — Z888 Allergy status to other drugs, medicaments and biological substances status: Secondary | ICD-10-CM | POA: Insufficient documentation

## 2016-01-11 DIAGNOSIS — D649 Anemia, unspecified: Secondary | ICD-10-CM | POA: Insufficient documentation

## 2016-01-11 DIAGNOSIS — Z882 Allergy status to sulfonamides status: Secondary | ICD-10-CM | POA: Diagnosis not present

## 2016-01-11 DIAGNOSIS — N803 Endometriosis of pelvic peritoneum: Secondary | ICD-10-CM | POA: Diagnosis not present

## 2016-01-11 DIAGNOSIS — N800 Endometriosis of uterus: Secondary | ICD-10-CM | POA: Diagnosis not present

## 2016-01-11 DIAGNOSIS — K589 Irritable bowel syndrome without diarrhea: Secondary | ICD-10-CM | POA: Insufficient documentation

## 2016-01-11 DIAGNOSIS — N801 Endometriosis of ovary: Secondary | ICD-10-CM | POA: Diagnosis not present

## 2016-01-11 DIAGNOSIS — N920 Excessive and frequent menstruation with regular cycle: Secondary | ICD-10-CM | POA: Diagnosis not present

## 2016-01-11 DIAGNOSIS — F419 Anxiety disorder, unspecified: Secondary | ICD-10-CM | POA: Insufficient documentation

## 2016-01-11 DIAGNOSIS — N979 Female infertility, unspecified: Secondary | ICD-10-CM | POA: Diagnosis not present

## 2016-01-11 DIAGNOSIS — Z8632 Personal history of gestational diabetes: Secondary | ICD-10-CM | POA: Diagnosis not present

## 2016-01-11 HISTORY — DX: Other complications of anesthesia, initial encounter: T88.59XA

## 2016-01-11 HISTORY — DX: Nausea with vomiting, unspecified: R11.2

## 2016-01-11 HISTORY — DX: Nausea with vomiting, unspecified: Z98.890

## 2016-01-11 HISTORY — DX: Adverse effect of unspecified anesthetic, initial encounter: T41.45XA

## 2016-01-11 HISTORY — PX: CHROMOPERTUBATION: SHX6288

## 2016-01-11 HISTORY — PX: LAPAROSCOPY: SHX197

## 2016-01-11 LAB — POCT PREGNANCY, URINE: PREG TEST UR: NEGATIVE

## 2016-01-11 SURGERY — LAPAROSCOPY, DIAGNOSTIC
Anesthesia: General | Wound class: Clean Contaminated

## 2016-01-11 MED ORDER — LIDOCAINE HCL (CARDIAC) 20 MG/ML IV SOLN
INTRAVENOUS | Status: DC | PRN
  Administered 2016-01-11: 100 mg via INTRAVENOUS

## 2016-01-11 MED ORDER — FENTANYL CITRATE (PF) 100 MCG/2ML IJ SOLN: 25.0000 ug | INTRAMUSCULAR | Status: DC | PRN

## 2016-01-11 MED ORDER — METHYLENE BLUE 0.5 % INJ SOLN
INTRAVENOUS | Status: DC | PRN
  Administered 2016-01-11: 120 mL

## 2016-01-11 MED ORDER — FAMOTIDINE 20 MG PO TABS
ORAL_TABLET | ORAL | Status: AC
  Administered 2016-01-11: 20 mg via ORAL
  Filled 2016-01-11: qty 1

## 2016-01-11 MED ORDER — IBUPROFEN 800 MG PO TABS: 800.0000 mg | ORAL_TABLET | Freq: Three times a day (TID) | ORAL | Status: DC

## 2016-01-11 MED ORDER — FAMOTIDINE 20 MG PO TABS
20.0000 mg | ORAL_TABLET | Freq: Once | ORAL | Status: AC
  Administered 2016-01-11: 20 mg via ORAL

## 2016-01-11 MED ORDER — PROPOFOL 10 MG/ML IV BOLUS
INTRAVENOUS | Status: DC | PRN
  Administered 2016-01-11: 160 mg via INTRAVENOUS

## 2016-01-11 MED ORDER — TRAMADOL HCL 50 MG PO TABS
100.0000 mg | ORAL_TABLET | Freq: Once | ORAL | Status: AC
  Administered 2016-01-11: 100 mg via ORAL
  Filled 2016-01-11: qty 2

## 2016-01-11 MED ORDER — ROCURONIUM BROMIDE 100 MG/10ML IV SOLN
INTRAVENOUS | Status: DC | PRN
  Administered 2016-01-11 (×2): 10 mg via INTRAVENOUS
  Administered 2016-01-11: 30 mg via INTRAVENOUS

## 2016-01-11 MED ORDER — LACTATED RINGERS IV SOLN
INTRAVENOUS | Status: DC
  Administered 2016-01-11 (×2): via INTRAVENOUS

## 2016-01-11 MED ORDER — ONDANSETRON HCL 4 MG/2ML IJ SOLN
INTRAMUSCULAR | Status: DC | PRN
  Administered 2016-01-11: 4 mg via INTRAVENOUS

## 2016-01-11 MED ORDER — EPHEDRINE SULFATE 50 MG/ML IJ SOLN
INTRAMUSCULAR | Status: DC | PRN
  Administered 2016-01-11 (×6): 5 mg via INTRAVENOUS

## 2016-01-11 MED ORDER — KETOROLAC TROMETHAMINE 30 MG/ML IJ SOLN
INTRAMUSCULAR | Status: DC | PRN
  Administered 2016-01-11: 30 mg via INTRAVENOUS

## 2016-01-11 MED ORDER — DEXAMETHASONE SODIUM PHOSPHATE 10 MG/ML IJ SOLN
INTRAMUSCULAR | Status: DC | PRN
  Administered 2016-01-11: 10 mg via INTRAVENOUS

## 2016-01-11 MED ORDER — TRAMADOL HCL 50 MG PO TABS
ORAL_TABLET | ORAL | Status: AC
  Filled 2016-01-11: qty 2

## 2016-01-11 MED ORDER — GLYCOPYRROLATE 0.2 MG/ML IJ SOLN
INTRAMUSCULAR | Status: DC | PRN
  Administered 2016-01-11: 0.2 mg via INTRAVENOUS
  Administered 2016-01-11: 0.4 mg via INTRAVENOUS

## 2016-01-11 MED ORDER — SCOPOLAMINE 1 MG/3DAYS TD PT72
MEDICATED_PATCH | TRANSDERMAL | Status: AC
  Filled 2016-01-11: qty 1

## 2016-01-11 MED ORDER — ONDANSETRON HCL 4 MG/2ML IJ SOLN: 4.0000 mg | Freq: Once | INTRAMUSCULAR | Status: DC | PRN

## 2016-01-11 MED ORDER — SCOPOLAMINE 1 MG/3DAYS TD PT72
1.0000 | MEDICATED_PATCH | TRANSDERMAL | Status: DC
  Administered 2016-01-11: 1.5 mg via TRANSDERMAL

## 2016-01-11 MED ORDER — METHYLENE BLUE 0.5 % INJ SOLN
INTRAVENOUS | Status: AC
  Filled 2016-01-11: qty 10

## 2016-01-11 MED ORDER — NEOSTIGMINE METHYLSULFATE 10 MG/10ML IV SOLN
INTRAVENOUS | Status: DC | PRN
  Administered 2016-01-11: 3 mg via INTRAVENOUS

## 2016-01-11 MED ORDER — FENTANYL CITRATE (PF) 100 MCG/2ML IJ SOLN
INTRAMUSCULAR | Status: DC | PRN
  Administered 2016-01-11: 25 ug via INTRAVENOUS
  Administered 2016-01-11: 50 ug via INTRAVENOUS
  Administered 2016-01-11: 100 ug via INTRAVENOUS
  Administered 2016-01-11: 25 ug via INTRAVENOUS

## 2016-01-11 MED ORDER — PHENYLEPHRINE HCL 10 MG/ML IJ SOLN
INTRAMUSCULAR | Status: DC | PRN
  Administered 2016-01-11 (×2): 100 ug via INTRAVENOUS

## 2016-01-11 MED ORDER — SODIUM CHLORIDE 0.9 % IJ SOLN: INTRAVENOUS | Status: DC | PRN

## 2016-01-11 MED ORDER — TRAMADOL HCL 50 MG PO TABS: 100.0000 mg | ORAL_TABLET | Freq: Four times a day (QID) | ORAL | Status: DC | PRN

## 2016-01-11 SURGICAL SUPPLY — 34 items
BLADE SURG SZ11 CARB STEEL (BLADE) ×2 IMPLANT
BNDG ADH 2 X3.75 FABRIC TAN LF (GAUZE/BANDAGES/DRESSINGS) ×3 IMPLANT
BNDG ADH XL 3.75X2 STRCH LF (GAUZE/BANDAGES/DRESSINGS)
CANISTER SUCT 1200ML W/VALVE (MISCELLANEOUS) ×2 IMPLANT
CATH ROBINSON RED A/P 16FR (CATHETERS) ×2 IMPLANT
CHLORAPREP W/TINT 26ML (MISCELLANEOUS) ×2 IMPLANT
DRSG TEGADERM 2-3/8X2-3/4 SM (GAUZE/BANDAGES/DRESSINGS) ×6 IMPLANT
DRSG TELFA 3X8 NADH (GAUZE/BANDAGES/DRESSINGS) ×2 IMPLANT
GLOVE BIO SURGEON STRL SZ8 (GLOVE) ×2 IMPLANT
GLOVE INDICATOR 8.0 STRL GRN (GLOVE) ×2 IMPLANT
GOWN STRL REUS W/ TWL LRG LVL3 (GOWN DISPOSABLE) ×1 IMPLANT
GOWN STRL REUS W/ TWL XL LVL3 (GOWN DISPOSABLE) ×1 IMPLANT
GOWN STRL REUS W/TWL LRG LVL3 (GOWN DISPOSABLE) ×2
GOWN STRL REUS W/TWL XL LVL3 (GOWN DISPOSABLE) ×2
IRRIGATION STRYKERFLOW (MISCELLANEOUS) IMPLANT
IRRIGATOR STRYKERFLOW (MISCELLANEOUS)
IV LACTATED RINGERS 1000ML (IV SOLUTION) ×2 IMPLANT
KIT RM TURNOVER CYSTO AR (KITS) ×2 IMPLANT
LABEL OR SOLS (LABEL) IMPLANT
MANIPULATOR UTERINE ZUMI 4.5 (MISCELLANEOUS) ×1 IMPLANT
NS IRRIG 1000ML POUR BTL (IV SOLUTION) ×2 IMPLANT
NS IRRIG 500ML POUR BTL (IV SOLUTION) ×2 IMPLANT
PACK GYN LAPAROSCOPIC (MISCELLANEOUS) ×2 IMPLANT
PAD OB MATERNITY 4.3X12.25 (PERSONAL CARE ITEMS) ×2 IMPLANT
PAD PREP 24X41 OB/GYN DISP (PERSONAL CARE ITEMS) ×2 IMPLANT
POUCH ENDO CATCH 10MM SPEC (MISCELLANEOUS) IMPLANT
SCISSORS METZENBAUM CVD 33 (INSTRUMENTS) IMPLANT
SLEEVE ENDOPATH XCEL 5M (ENDOMECHANICALS) ×2 IMPLANT
SUT VIC AB 0 CT2 27 (SUTURE) ×2 IMPLANT
SUT VIC AB 0 UR5 27 (SUTURE) ×1 IMPLANT
SUT VIC AB 4-0 FS2 27 (SUTURE) ×2 IMPLANT
TROCAR XCEL NON-BLD 5MMX100MML (ENDOMECHANICALS) ×2 IMPLANT
TUBING INSUFFLATOR HI FLOW (MISCELLANEOUS) ×2 IMPLANT
TUBING PRES M/F 48IN 4237111 (TUBING) ×1 IMPLANT

## 2016-01-11 NOTE — Op Note (Signed)
Diana Brooks PROCEDURE DATE: 01/11/2016 10:42 AM  PREOPERATIVE DIAGNOSIS: INFERTILITY,ENDOMETRIOSIS POSTOPERATIVE DIAGNOSIS: uterine fibroid, endometriosis, bilateral patent fallopian tubes PROCEDURE: Procedure(s): LAPAROSCOPY DIAGNOSTIC/ WITH EXCISION AND FULGERATION OF ENDOMETRIOSIS; MYOMECTOMY (N/A) CHROMOPERTUBATION OF FALLOPIAN TUBES (N/A) SURGEON:  Dr. Hassell Done A Favor Kreh ASSISTANT: None ANESTHESIA: General Endotracheal  INDICATIONS: 35 y.o. G1P1001 with history of chronic pelvic pain concerning for endometriosis desiring surgical evaluation.   Please see preoperative notes for further details.   FINDINGS:   1. Right uterosacral ligaments powder burn implant, excised and cauterized 2. Left ovarian fossa White lesion/scarring, excised and cauterized 3. Anterior uterus, left side serosa with clear blebs, biopsied, and cauterized 4. 7 mm rt lower uterine segment fibroid, removed, and cauterized 5. Uterine fundus serosa, white lesion, excised, and cauterized 6. Bilateral fallopian tube spill with chromopertubation 6. Normal-appearing ovaries bilaterally with corpus luteum cyst on left 7. Normal upper abdomen including liver and gallbladder   I/O's: Total I/O In: 1300 [I.V.:1300] Out: 260 [Urine:250; Blood:10] SPECIMENS: Peritoneal biopsies   1. Rt Uterosacral Ligament  2. Lt Ovarian Fossa  3. Lt uterine serosa  4.  Rt LUS Fibroid  5. Uterine Fundus COMPLICATIONS: None immediate COUNTS:  YES  PROCEDURE IN DETAIL: The patient was brought to the operating room where she was placed in the supine position.  General endotracheal anesthesia was induced without difficulty.  She was placed in the dorsal lithotomy position using bumblebee stirrups.  A ChloraPrep and Betadine, abdominal, perineal, intravaginal prep and drape was performed in standard fashion.  The timeout was completed.  The red Robison catheter was used to drain the bladder of clear urine.  A weighted speculum was  placed into the vagina and a Zumi Uterine Manipulator/injector was placed into the uterus to facilitate uterine manipulation and chromopertubation.. Laparoscopy was performed in standard fashion.  The Optiview 5 mm trocar and sleeve were placed through a 5 mm subumbilical incision directly into the abdominal pelvic cavity, without evidence of bowel or vascular injury. One  Additional 5 mm port was placed in the lower quadrant, respectively, under direct visualization.  The above noted findings were photo documented. Peritoneal biopsies were taken using the biopsy forceps instrument. The 7 mm fibroid excised with biopsy forceps as well. Following removal biopsy specimens the sites were cauterized using Kleppinger bipolar forceps. Pelvis was copiously irrigated with the irrigant fluid being aspirated. Methylene blue solution was then used to chromopertubation the uterus and fallopian tubes with evidence of bilateral tubal spill. No significant adhesive disease was identified. Upon completion of the procedures and inspection for hemostasis, the surgery was complete with all instrumentation being removed from the abdominal pelvic cavity.  The pneumoperitoneum was released.  The incisions were closed using 4-0 Monocryl simple interrupted sutures. Tegaderm/Telfa dressings were applied..  The patient was awakened, extubated, and taken to the recovery room in satisfactory condition.  Brayton Mars, MD [facility]

## 2016-01-11 NOTE — Anesthesia Preprocedure Evaluation (Signed)
Anesthesia Evaluation  Patient identified by MRN, date of birth, ID band Patient awake    Reviewed: Allergy & Precautions, NPO status , Patient's Chart, lab work & pertinent test results  History of Anesthesia Complications (+) PONV  Airway Mallampati: II       Dental  (+) Teeth Intact   Pulmonary asthma ,           Cardiovascular negative cardio ROS   Rhythm:Regular     Neuro/Psych    GI/Hepatic negative GI ROS, Neg liver ROS,   Endo/Other  diabetes  Renal/GU negative Renal ROS     Musculoskeletal   Abdominal   Peds negative pediatric ROS (+)  Hematology  (+) anemia ,   Anesthesia Other Findings   Reproductive/Obstetrics                             Anesthesia Physical Anesthesia Plan  ASA: II  Anesthesia Plan: General   Post-op Pain Management:    Induction: Intravenous  Airway Management Planned: Oral ETT  Additional Equipment:   Intra-op Plan:   Post-operative Plan: Extubation in OR  Informed Consent:   Plan Discussed with: CRNA  Anesthesia Plan Comments:         Anesthesia Quick Evaluation

## 2016-01-11 NOTE — H&P (View-Only) (Signed)
Subjective:  Preoperative history and physical    Patient is a 35 y.o. G1P1097female scheduled for laparoscopy with excision and fulguration of endometriosis; chromopertubation of fallopian tubes. Indications for procedure are history of moderate to severe endometriosis and blocked fallopian tube; secondary infertility.  Previous laparoscopy demonstrated moderate to severe endometriosis and one fallopian tube, which was blocked.  Currently patient is not having any significant dysmenorrhea or dyspareunia; however, periods are heavier with clots.   Pertinent Gynecological History: Menses: regular Bleeding:heavy Contraception:none Last mammogram: NA Last pap: normal  HISTORY OF MODERATE TO SEVERE ENDOMETRIOSIS  Discussed Blood/Blood Products: no   Menstrual History: OB History    Gravida Para Term Preterm AB TAB SAB Ectopic Multiple Living   1 1 1       1       Menarche age: NA  Patient's last menstrual period was 12/30/2015 (exact date).    Past Medical History  Diagnosis Date  . Asthma     exercise induced  . IBS (irritable bowel syndrome)   . Hyperprolactinemia ([place])   . Anemia   . Endometriosis   . Dysmenorrhea   . Anxiety   . GDM (gestational diabetes mellitus)   . Infertility, female     Past Surgical History  Procedure Laterality Date  . Wrist srugery      ganglion cyst removed  . Cesarean section    . Laparoscopy      lap with excision of fulguration of endometriosis    OB History  Gravida Para Term Preterm AB SAB TAB Ectopic Multiple Living  1 1 1       1     # Outcome Date GA Lbr Len/2nd Weight Sex Delivery Anes PTL Lv  1 Term    8 lb 1.6 oz (3.674 kg) M CS-LTranv   Y      Social History   Social History  . Marital Status: Married    Spouse Name: N/A  . Number of Children: N/A  . Years of Education: N/A   Social History Main Topics  . Smoking status: Never Smoker   . Smokeless tobacco: None  . Alcohol Use: Yes     Comment: rare  . Drug  Use: No  . Sexual Activity: Yes    Birth Control/ Protection: None   Other Topics Concern  . None   Social History Narrative    Family History  Problem Relation Age of Onset  . Endometriosis Mother   . Diabetes Paternal Grandfather   . Heart disease Neg Hx   . Breast cancer Neg Hx   . Ovarian cancer Neg Hx   . Prostate cancer Maternal Uncle      (Not in a hospital admission)  Allergies  Allergen Reactions  . Levaquin [Levofloxacin In D5w]   . Sulfa Antibiotics   . Suprax [Cefixime]     Review of Systems Constitutional: No recent fever/chills/sweats Respiratory: No recent cough/bronchitis Cardiovascular: No chest pain Gastrointestinal: No recent nausea/vomiting/diarrhea Genitourinary: No UTI symptoms Hematologic/lymphatic:No history of coagulopathy or recent blood thinner use    Objective:    BP 101/63 mmHg  Pulse 87  Ht 5\' 4"  (1.626 m)  Wt 157 lb 12.8 oz (71.578 kg)  BMI 27.07 kg/m2  LMP 12/30/2015 (Exact Date)  General:   Normal  Skin:   normal  HEENT:  Normal  Neck:  Supple without Adenopathy or Thyromegaly  Lungs:   Heart:              Breasts:  Abdomen:  Pelvis:  M/S   Extremeties:  Neuro:    clear to auscultation bilaterally   Normal without murmur   Not Examined   soft, non-tender; bowel sounds normal; no masses,  no organomegaly   Exam deferred to OR  No CVAT  Warm/Dry   Normal          Assessment:  1.  Endometriosis, moderate to severe. 2.  Blocked fallopian tube. 3.  Secondary infertility  Plan:  1.  Laparoscopy with excision and fulguration of endometriosis. 2.  Chromopertubation of fallopian tubes    Preop counseling: The patient is to undergo laparoscopy with excision and fulguration of endometriosis with the use of J plasma as well as chromopertubation of fallopian tubes.  The patient is understanding of the planned procedure and is aware of and is accepting of all surgical risks which include but are not limited to  bleeding, infection, pelvic organ injury, need for repair, blood clot disorders, anesthesia risks, etc.  All questions have been answered.  Informed consent is given.  Patient is ready and willing to proceed with surgery as scheduled.  Brayton Mars, MD  Note: This dictation was prepared with Dragon dictation along with smaller phrase technology. Any transcriptional errors that result from this process are unintentional.

## 2016-01-11 NOTE — Interval H&P Note (Signed)
History and Physical Interval Note:  01/11/2016 8:32 AM  Diana Brooks  has presented today for surgery, with the diagnosis of INFERTILITY,ENDOMETRIOSIS  The various methods of treatment have been discussed with the patient and family. After consideration of risks, benefits and other options for treatment, the patient has consented to  Procedure(s): LAPAROSCOPY DIAGNOSTIC/ WITH BIOPSIES (N/A) CHROMOPERTUBATION OF FALLOPIAN TUBES (N/A) as a surgical intervention .  The patient's history has been reviewed, patient examined, no change in status, stable for surgery.  I have reviewed the patient's chart and labs.  Questions were answered to the patient's satisfaction.     Hassell Done A Defrancesco

## 2016-01-11 NOTE — Discharge Instructions (Signed)
Laparoscopic Lysis of Abdominal Adhesions, Care After Refer to this sheet in the next few weeks. These instructions provide you with information about caring for yourself after your procedure. Your health care provider may also give you more specific instructions. Your treatment has been planned according to current medical practices, but problems sometimes occur. Call your health care provider if you have any problems or questions after your procedure. WHAT TO EXPECT AFTER THE PROCEDURE After your procedure, it is common to have some pain around the incision. HOME CARE INSTRUCTIONS  Take medicines only as directed by your health care provider.  You may need to start by eating only a little at a time. Start with liquids. As your appetite improves, gradually return to eating solid foods.  Do not lift anything that is heavier than 10 lb (4.5 kg) for four weeks.  Return to your normal activities as directed by your health care provider. Ask your health care provider what activities are safe for you.  There are many different ways to close and cover an incision, including stitches (sutures), skin glue, and adhesive strips. Follow instructions from your health care provider about:  Incision care.  Bandage (dressing) changes and removal.  Incision closure removal.  Check your incisions every day for signs of infection. Watch for:  Redness, swelling, or pain.  Fluid , blood, or pus. SEEK MEDICAL CARE IF:  You develop a fever or chills.  You have redness, swelling, or pain at the site of your incisions.  You have fluid, blood, or pus coming from your incisions.  There is a bad smell coming from your incisions or the dressing.  Your pain gets worse.  You have a cough.  You have nausea and vomiting that does not go away after 3 hours. SEEK IMMEDIATE MEDICAL CARE IF:  You develop severe pain in your abdomen or your chest.  You develop shortness of breath.  You develop nausea or  vomiting that is severe or keeps coming back.   This information is not intended to replace advice given to you by your health care provider. Make sure you discuss any questions you have with your health care provider.   Document Released: 03/24/2015 Document Reviewed: 03/24/2015 Elsevier Interactive Patient Education 2016 [place]   1) The drugs that you were given will stay in your system until tomorrow so for the next 24 hours you should not:  A) Drive an automobile B) Make any legal decisions C) Drink any alcoholic beverage   2) You may resume regular meals tomorrow.  Today it is better to start with liquids and gradually work up to solid foods.  You may eat anything you prefer, but it is better to start with liquids, then soup and crackers, and gradually work up to solid foods.   3) Please notify your doctor immediately if you have any unusual bleeding, trouble breathing, redness and pain at the surgery site, drainage, fever, or pain not relieved by medication.    4) Additional Instructions:        Please contact your physician with any problems or Same Day Surgery at [phone], Monday through Friday 6 am to 4 pm, or [place] at [facility] number at [phone].

## 2016-01-11 NOTE — Anesthesia Postprocedure Evaluation (Signed)
Anesthesia Post Note  Patient: Diana Brooks  Procedure(s) Performed: Procedure(s) (LRB): LAPAROSCOPY DIAGNOSTIC/ WITH EXCISION AND FULGERATION OF ENDOMETRIOSIS; MYOMECTOMY (N/A) CHROMOPERTUBATION OF FALLOPIAN TUBES (N/A)  Patient location during evaluation: PACU Anesthesia Type: General Level of consciousness: awake Pain management: pain level controlled Vital Signs Assessment: post-procedure vital signs reviewed and stable Respiratory status: spontaneous breathing Cardiovascular status: stable Anesthetic complications: no    Last Vitals:  Filed Vitals:   01/11/16 0825 01/11/16 1051  BP: 109/66 133/62  Pulse: 97 95  Temp: 37 C 36.6 C  Resp: 18 6    Last Pain: There were no vitals filed for this visit.               VAN STAVEREN,Georgiana Spillane

## 2016-01-11 NOTE — Transfer of Care (Signed)
Immediate Anesthesia Transfer of Care Note  Patient: Diana Brooks  Procedure(s) Performed: Procedure(s): LAPAROSCOPY DIAGNOSTIC/ WITH EXCISION AND FULGERATION OF ENDOMETRIOSIS; MYOMECTOMY (N/A) CHROMOPERTUBATION OF FALLOPIAN TUBES (N/A)  Patient Location: PACU  Anesthesia Type:General  Level of Consciousness: awake, alert , oriented and patient cooperative  Airway & Oxygen Therapy: Patient Spontanous Breathing and Patient connected to nasal cannula oxygen  Post-op Assessment: Report given to RN, Post -op Vital signs reviewed and stable and Patient moving all extremities  Post vital signs: Reviewed and stable  Last Vitals:  Filed Vitals:   01/11/16 0825 01/11/16 1051  BP: 109/66 133/62  Pulse: 97 95  Temp: 37 C 36.6 C  Resp: 18 6    Complications: No apparent anesthesia complications

## 2016-01-11 NOTE — Anesthesia Procedure Notes (Signed)
Procedure Name: Intubation Date/Time: 01/11/2016 9:28 AM Performed by: Alda Berthold Pre-anesthesia Checklist: Patient identified, Patient being monitored, Timeout performed, Emergency Drugs available and Suction available Patient Re-evaluated:Patient Re-evaluated prior to inductionOxygen Delivery Method: Circle system utilized Preoxygenation: Pre-oxygenation with 100% oxygen Intubation Type: IV induction Ventilation: Mask ventilation without difficulty Laryngoscope Size: Mac and 3 Grade View: Grade I Tube type: Oral Tube size: 6.5 mm Number of attempts: 1 Placement Confirmation: ETT inserted through vocal cords under direct vision,  positive ETCO2 and breath sounds checked- equal and bilateral Secured at: 21 cm Tube secured with: Tape Dental Injury: Teeth and Oropharynx as per pre-operative assessment

## 2016-01-12 LAB — SURGICAL PATHOLOGY

## 2016-01-19 ENCOUNTER — Encounter: Payer: Self-pay | Admitting: Obstetrics and Gynecology

## 2016-01-19 ENCOUNTER — Ambulatory Visit (INDEPENDENT_AMBULATORY_CARE_PROVIDER_SITE_OTHER): Payer: BLUE CROSS/BLUE SHIELD | Admitting: Obstetrics and Gynecology

## 2016-01-19 VITALS — BP 106/63 | HR 88 | Ht 64.0 in | Wt 158.4 lb

## 2016-01-19 DIAGNOSIS — N809 Endometriosis, unspecified: Secondary | ICD-10-CM

## 2016-01-19 DIAGNOSIS — Z09 Encounter for follow-up examination after completed treatment for conditions other than malignant neoplasm: Secondary | ICD-10-CM

## 2016-01-19 DIAGNOSIS — N979 Female infertility, unspecified: Secondary | ICD-10-CM

## 2016-01-19 NOTE — Patient Instructions (Signed)
1. Resume all activities as tolerated. 2. F/U in April as scheduled.

## 2016-01-19 NOTE — Progress Notes (Signed)
Chief complaint: 1.  One week postop check. 2.  Status post laparoscopy with excision and fulguration of endometriosis and chromopertubation of fallopian tubes.  Patient is doing well 1 week postop.  Bowel and bladder function are normal.  Minimal pain is noted.  Findings from surgery were reviewed including the following:   Implant of right uterosacral ligament and white lesions/scarring on the left ovarian fossa, biopsied and cauterized; pathology demonstrates endometriosis. Chromopertubation of fallopian tubes demonstrated bilateral patency.  OBJECTIVE: BP 106/63 mmHg  Pulse 88  Ht 5\' 4"  (1.626 m)  Wt 158 lb 6.4 oz (71.85 kg)  BMI 27.18 kg/m2  LMP 12/30/2015 (Exact Date) Abdomen: Laparoscopy incisions well approximated; residual suture present: No erythema, induration, drainage, or hernia  Pathology: Endometriosis of uterosacral ligament and left ovarian fossa; uterine fibroid; endosalpingiosis.  ASSESSMENT: 1.  Normal one-week postop check. 2.  Endometriosis confirmed on biopsies, excised and cauterized. 3.  Patent bilateral fallopian tubes.  PLAN: 1.  Resume all activities without restriction. 2.  Wait for 1 normal cycle before attempting conception. 3.  Return in April for annual exam as scheduled.  Brayton Mars, MD  Note: This dictation was prepared with Dragon dictation along with smaller phrase technology. Any transcriptional errors that result from this process are unintentional.

## 2016-02-19 ENCOUNTER — Telehealth: Payer: Self-pay | Admitting: Obstetrics and Gynecology

## 2016-02-19 NOTE — Telephone Encounter (Signed)
Pt had surgery Feb. Having cycles q 30. 7 days 1-2 days heavy changing q 2-3 hours. NOT painful. Pt aware sounds wnl. No spotting in between. Not longer than 7 days or changing q 30 minutes. Trying to conceive. Pt aware mad will refer at [facility] in a few weeks.

## 2016-02-19 NOTE — Telephone Encounter (Signed)
Patient called stating she is having heavy periods and has had 2 cycles this month. She was also requesting a referral to an infertility clinic.Thanks

## 2016-03-02 DIAGNOSIS — D485 Neoplasm of uncertain behavior of skin: Secondary | ICD-10-CM | POA: Diagnosis not present

## 2016-03-02 DIAGNOSIS — D1801 Hemangioma of skin and subcutaneous tissue: Secondary | ICD-10-CM | POA: Diagnosis not present

## 2016-03-02 DIAGNOSIS — L820 Inflamed seborrheic keratosis: Secondary | ICD-10-CM | POA: Diagnosis not present

## 2016-03-03 ENCOUNTER — Encounter: Payer: Self-pay | Admitting: Obstetrics and Gynecology

## 2016-03-29 ENCOUNTER — Encounter: Payer: Self-pay | Admitting: Obstetrics and Gynecology

## 2016-03-29 ENCOUNTER — Ambulatory Visit (INDEPENDENT_AMBULATORY_CARE_PROVIDER_SITE_OTHER): Payer: BLUE CROSS/BLUE SHIELD | Admitting: Obstetrics and Gynecology

## 2016-03-29 VITALS — BP 124/74 | HR 81 | Ht 64.0 in | Wt 156.4 lb

## 2016-03-29 DIAGNOSIS — Z01419 Encounter for gynecological examination (general) (routine) without abnormal findings: Secondary | ICD-10-CM

## 2016-03-29 DIAGNOSIS — N809 Endometriosis, unspecified: Secondary | ICD-10-CM

## 2016-03-29 NOTE — Patient Instructions (Signed)
1. Pap smear is done 2. Self breast exam is encouraged 3. Healthy eating and exercise is to be continued 4. Continue taking prenatal vitamins 5. Continue monitoring menstrual cycles with menstrual calendar 6. If menstrual cycles appeared to be short (21-26 days) over the next 6 months, return for consideration of possible Clomid therapy for optimal ovulation induction 7. Return in 1 year

## 2016-03-29 NOTE — Progress Notes (Signed)
Patient ID: Diana Brooks, female   DOB: 21-May-1981, 35 y.o.   MRN: [id number] ANNUAL PREVENTATIVE CARE GYN  ENCOUNTER NOTE  Subjective:       Diana Brooks is a 35 y.o. G25P1001 female here for a routine annual gynecologic exam.  Current complaints: 1.  CYCLES HEAVY    Cycles are every 24-26 days; slightly heavier since surgery; cramping Laparoscopy in February 2016 demonstrated endometriosis of right uterosacral ligament, left ovarian fossa; 7 mm right lower uterine segment fibroid.  Fallopian tubes were patent bilaterally.   Gynecologic History Patient's last menstrual period was 03/07/2015 (exact date). Contraception: none Last Pap: 02/2015 PAP W.RFLX. Results were: normal Last mammogram: N/A. Results were: N/A  Obstetric History OB History  Gravida Para Term Preterm AB SAB TAB Ectopic Multiple Living  1 1 1       1     # Outcome Date GA Lbr Len/2nd Weight Sex Delivery Anes PTL Lv  1 Term    8 lb 1.6 oz (3.674 kg) M CS-LTranv   Y      Past Medical History  Diagnosis Date  . Asthma     exercise induced  . IBS (irritable bowel syndrome)   . Hyperprolactinemia ([place])   . Anemia   . Endometriosis   . Dysmenorrhea   . Anxiety   . Infertility, female   . GDM (gestational diabetes mellitus)   . Complication of anesthesia   . PONV (postoperative nausea and vomiting)     states phenergan helps alot post op n/v    Past Surgical History  Procedure Laterality Date  . Wrist srugery      ganglion cyst removed  . Cesarean section    . Laparoscopy      lap with excision of fulguration of endometriosis  . Laparoscopy N/A 01/11/2016    Procedure: LAPAROSCOPY DIAGNOSTIC/ WITH EXCISION AND FULGERATION OF ENDOMETRIOSIS; MYOMECTOMY;  Surgeon: Brayton Mars, MD;  Location: [facility] ORS;  Service: Gynecology;  Laterality: N/A;  . Chromopertubation N/A 01/11/2016    Procedure: CHROMOPERTUBATION OF FALLOPIAN TUBES;  Surgeon: Brayton Mars, MD;  Location: [facility] ORS;   Service: Gynecology;  Laterality: N/A;    Current Outpatient Prescriptions on File Prior to Visit  Medication Sig Dispense Refill  . acetaminophen (TYLENOL) 500 MG tablet Take 500 mg by mouth every 6 (six) hours as needed.    Marland Kitchen albuterol (PROVENTIL HFA;VENTOLIN HFA) 108 (90 BASE) MCG/ACT inhaler Inhale into the lungs every 6 (six) hours as needed for wheezing or shortness of breath.    . ALPRAZolam (XANAX) 0.25 MG tablet Take 0.25 mg by mouth at bedtime as needed for anxiety.    . Cholecalciferol (VITAMIN D HIGH POTENCY PO) Take 5,000 Units by mouth daily.    Marland Kitchen ibuprofen (ADVIL,MOTRIN) 800 MG tablet Take 1 tablet (800 mg total) by mouth 3 (three) times daily. 30 tablet 1  . Multiple Vitamin (MULTIVITAMIN) tablet Take 1 tablet by mouth daily.     No current facility-administered medications on file prior to visit.    Allergies  Allergen Reactions  . Codeine Nausea And Vomiting  . Levaquin [Levofloxacin In D5w]   . Percocet [Oxycodone-Acetaminophen] Nausea And Vomiting  . Sulfa Antibiotics   . Suprax [Cefixime]   . Vicodin [Hydrocodone-Acetaminophen] Nausea And Vomiting    Social History   Social History  . Marital Status: Married    Spouse Name: N/A  . Number of Children: N/A  . Years of Education: N/A   Occupational History  .  Not on file.   Social History Main Topics  . Smoking status: Never Smoker   . Smokeless tobacco: Never Used  . Alcohol Use: Yes     Comment: rare  . Drug Use: No  . Sexual Activity: Yes    Birth Control/ Protection: None   Other Topics Concern  . Not on file   Social History Narrative    Family History  Problem Relation Age of Onset  . Endometriosis Mother   . Diabetes Paternal Grandfather   . Heart disease Neg Hx   . Breast cancer Neg Hx   . Ovarian cancer Neg Hx   . Prostate cancer Maternal Uncle     The following portions of the patient's history were reviewed and updated as appropriate: allergies, current medications, past family  history, past medical history, past social history, past surgical history and problem list.  Review of Systems ROS Review of Systems - General ROS: negative for - chills, fatigue, fever, hot flashes, night sweats, weight gain or weight loss Psychological ROS: negative for - anxiety, decreased libido, depression, mood swings, physical abuse or sexual abuse Ophthalmic ROS: negative for - blurry vision, eye pain or loss of vision ENT ROS: negative for - headaches, hearing change, visual changes or vocal changes Allergy and Immunology ROS: negative for - hives, itchy/watery eyes or seasonal allergies Hematological and Lymphatic ROS: negative for - bleeding problems, bruising, swollen lymph nodes or weight loss Endocrine ROS: negative for - galactorrhea, hair pattern changes, hot flashes, malaise/lethargy, mood swings, palpitations, polydipsia/polyuria, skin changes, temperature intolerance or unexpected weight changes Breast ROS: negative for - new or changing breast lumps or nipple discharge Respiratory ROS: negative for - cough or shortness of breath Cardiovascular ROS: negative for - chest pain, irregular heartbeat, palpitations or shortness of breath Gastrointestinal ROS: no abdominal pain, change in bowel habits, or black or bloody stools Genito-Urinary ROS: no dysuria, trouble voiding, or hematuria Musculoskeletal ROS: negative for - joint pain or joint stiffness Neurological ROS: negative for - bowel and bladder control changes Dermatological ROS: negative for rash and skin lesion changes   Objective:   BP 124/74 mmHg  Pulse 81  Ht 5\' 4"  (1.626 m)  Wt 156 lb 6.4 oz (70.943 kg)  BMI 26.83 kg/m2  LMP 03/07/2015 (Exact Date) CONSTITUTIONAL: Well-developed, well-nourished female in no acute distress.  PSYCHIATRIC: Normal mood and affect. Normal behavior. Normal judgment and thought content. [place]: Alert and oriented to person, place, and time. Normal muscle tone coordination. No  cranial nerve deficit noted. HENT:  Normocephalic, atraumatic, External right and left ear normal. Oropharynx is clear and moist EYES: Conjunctivae and EOM are normal. Pupils are equal, round, and reactive to light. No scleral icterus.  NECK: Normal range of motion, supple, no masses.  Normal thyroid.  SKIN: Skin is warm and dry. No rash noted. Not diaphoretic. No erythema. No pallor. CARDIOVASCULAR: Normal heart rate noted, regular rhythm, no murmur. RESPIRATORY: Clear to auscultation bilaterally. Effort and breath sounds normal, no problems with respiration noted. BREASTS: Symmetric in size. No masses, skin changes, nipple drainage, or lymphadenopathy. ABDOMEN: Soft, normal bowel sounds, no distention noted.  No tenderness, rebound or guarding.  BLADDER: Normal PELVIC:  External Genitalia: Normal  BUS: Normal  Vagina: Normal  Cervix: Normal; No lesions; no cervical motion tenderness  Uterus: Normal; midplane, normal size and shape, mobile  Adnexa: Normal  RV: External Exam NormaI  MUSCULOSKELETAL: Normal range of motion. No tenderness.  No cyanosis, clubbing, or edema.  2+ distal  pulses. LYMPHATIC: No Axillary, Supraclavicular, or Inguinal Adenopathy.    Assessment:   Annual gynecologic examination 35 y.o. Contraception: none BMI- 26  endometriosis, stable Secondary infertility, currently Attempting to conceive  Plan:  Pap: Pap, Reflex if ASCUS Mammogram: Not Indicated Stool Guaiac Testing:  Not Indicated Labs: THRU PCP Routine preventative health maintenance measures emphasized: Exercise/Diet/Weight control, Tobacco Warnings and Alcohol/Substance use risks Continue monitoring menstrual calendar cycles; This cycles continue to be 21-26 duration intervals, return in 6 months for consideration of possible therapy Continue prenatal vitamins Return to [place], CMA  Brayton Mars, MD  Note: This dictation was prepared with Dragon dictation  along with smaller phrase technology. Any transcriptional errors that result from this process are unintentional.

## 2016-04-01 LAB — PAP IG W/ RFLX HPV ASCU: PAP SMEAR COMMENT: 0

## 2016-04-26 DIAGNOSIS — D485 Neoplasm of uncertain behavior of skin: Secondary | ICD-10-CM | POA: Diagnosis not present

## 2016-04-26 DIAGNOSIS — D2372 Other benign neoplasm of skin of left lower limb, including hip: Secondary | ICD-10-CM | POA: Diagnosis not present

## 2016-04-26 DIAGNOSIS — C44319 Basal cell carcinoma of skin of other parts of face: Secondary | ICD-10-CM | POA: Diagnosis not present

## 2016-05-18 DIAGNOSIS — Z85828 Personal history of other malignant neoplasm of skin: Secondary | ICD-10-CM | POA: Diagnosis not present

## 2016-05-18 DIAGNOSIS — C44310 Basal cell carcinoma of skin of unspecified parts of face: Secondary | ICD-10-CM | POA: Diagnosis not present

## 2016-05-18 DIAGNOSIS — L578 Other skin changes due to chronic exposure to nonionizing radiation: Secondary | ICD-10-CM | POA: Diagnosis not present

## 2016-08-05 DIAGNOSIS — L570 Actinic keratosis: Secondary | ICD-10-CM | POA: Diagnosis not present

## 2016-09-19 ENCOUNTER — Telehealth: Payer: Self-pay | Admitting: Obstetrics and Gynecology

## 2016-09-19 NOTE — Telephone Encounter (Signed)
Pt called and she wanted you to call her back, she said she spoke with you about a week ago but needed to talk to you again.

## 2016-09-19 NOTE — Telephone Encounter (Signed)
Spoke with patient.

## 2016-09-28 ENCOUNTER — Encounter: Payer: BLUE CROSS/BLUE SHIELD | Admitting: Obstetrics and Gynecology

## 2016-10-03 DIAGNOSIS — N979 Female infertility, unspecified: Secondary | ICD-10-CM | POA: Diagnosis not present

## 2016-10-05 DIAGNOSIS — Z1159 Encounter for screening for other viral diseases: Secondary | ICD-10-CM | POA: Diagnosis not present

## 2016-10-05 DIAGNOSIS — N979 Female infertility, unspecified: Secondary | ICD-10-CM | POA: Diagnosis not present

## 2016-10-05 DIAGNOSIS — Z3169 Encounter for other general counseling and advice on procreation: Secondary | ICD-10-CM | POA: Diagnosis not present

## 2016-10-06 DIAGNOSIS — H5203 Hypermetropia, bilateral: Secondary | ICD-10-CM | POA: Diagnosis not present

## 2016-10-21 DIAGNOSIS — L570 Actinic keratosis: Secondary | ICD-10-CM | POA: Diagnosis not present

## 2016-11-04 ENCOUNTER — Ambulatory Visit
Admission: EM | Admit: 2016-11-04 | Discharge: 2016-11-04 | Disposition: A | Discharge status: Home / Self Care | Payer: BLUE CROSS/BLUE SHIELD | Attending: Family Medicine | Admitting: Family Medicine

## 2016-11-04 ENCOUNTER — Encounter: Payer: Self-pay | Admitting: *Deleted

## 2016-11-04 DIAGNOSIS — R69 Illness, unspecified: Secondary | ICD-10-CM

## 2016-11-04 DIAGNOSIS — J111 Influenza due to unidentified influenza virus with other respiratory manifestations: Secondary | ICD-10-CM

## 2016-11-04 LAB — RAPID INFLUENZA A&B ANTIGENS (ARMC ONLY)
INFLUENZA A (ARMC): NEGATIVE
INFLUENZA B (ARMC): NEGATIVE

## 2016-11-04 MED ORDER — OSELTAMIVIR PHOSPHATE 75 MG PO CAPS: 75.0000 mg | ORAL_CAPSULE | Freq: Two times a day (BID) | ORAL | 0 refills | Status: DC

## 2016-11-04 NOTE — ED Triage Notes (Signed)
PAtient started having symptoms of cough, chest congestion, and fever yesterday that have become worse quickly.

## 2016-11-04 NOTE — ED Provider Notes (Signed)
[facility]    CSN: [id number] Arrival date & time: 11/04/16  1025     History   Chief Complaint Chief Complaint  Patient presents with  . Cough  . Fever  . Generalized Body Aches    HPI Diana Brooks is a 35 y.o. female.   The history is provided by the patient.  Cough  Associated symptoms: fever, headaches and myalgias   Associated symptoms: no wheezing   Fever  Associated symptoms: cough, headaches and myalgias   URI  Presenting symptoms: cough and fever   Severity:  Moderate Onset quality:  Sudden Duration:  1 day Timing:  Constant Progression:  Worsening Chronicity:  New Relieved by:  None tried Ineffective treatments:  None tried Associated symptoms: headaches and myalgias   Associated symptoms: no sinus pain and no wheezing   Risk factors: sick contacts   Risk factors: not elderly, no chronic cardiac disease, no chronic kidney disease, no chronic respiratory disease, no diabetes mellitus, no immunosuppression, no recent illness and no recent travel     Past Medical History:  Diagnosis Date  . Anemia   . Anxiety   . Asthma    exercise induced  . Complication of anesthesia   . Dysmenorrhea   . Endometriosis   . GDM (gestational diabetes mellitus)   . Hyperprolactinemia ([place])   . IBS (irritable bowel syndrome)   . Infertility, female   . PONV (postoperative nausea and vomiting)    states phenergan helps alot post op n/v    Patient Active Problem List   Diagnosis Date Noted  . Endometriosis 10/22/2015  . Infertility, female, secondary 10/22/2015  . Cyst of nipple 10/22/2015    Past Surgical History:  Procedure Laterality Date  . CESAREAN SECTION    . CHROMOPERTUBATION N/A 01/11/2016   Procedure: CHROMOPERTUBATION OF FALLOPIAN TUBES;  Surgeon: Brayton Mars, MD;  Location: [facility];  Service: Gynecology;  Laterality: N/A;  . LAPAROSCOPY     lap with excision of fulguration of endometriosis  . LAPAROSCOPY N/A 01/11/2016    Procedure: LAPAROSCOPY DIAGNOSTIC/ WITH EXCISION AND FULGERATION OF ENDOMETRIOSIS; MYOMECTOMY;  Surgeon: Brayton Mars, MD;  Location: [facility];  Service: Gynecology;  Laterality: N/A;  . wrist srugery     ganglion cyst removed    OB History    Gravida Para Term Preterm AB Living   1 1 1     1    SAB TAB Ectopic Multiple Live Births           1       Home Medications    Prior to Admission medications   Medication Sig Start Date End Date Taking? Authorizing Provider  acetaminophen (TYLENOL) 500 MG tablet Take 500 mg by mouth every 6 (six) hours as needed.   Yes Historical Provider, MD  Cholecalciferol (VITAMIN D HIGH POTENCY PO) Take 5,000 Units by mouth daily.   Yes Historical Provider, MD  Multiple Vitamin (MULTIVITAMIN) tablet Take 1 tablet by mouth daily.   Yes Historical Provider, MD  albuterol (PROVENTIL HFA;VENTOLIN HFA) 108 (90 BASE) MCG/ACT inhaler Inhale into the lungs every 6 (six) hours as needed for wheezing or shortness of breath.    Historical Provider, MD  ALPRAZolam Duanne Moron) 0.25 MG tablet Take 0.25 mg by mouth at bedtime as needed for anxiety.    Historical Provider, MD  ibuprofen (ADVIL,MOTRIN) 800 MG tablet Take 1 tablet (800 mg total) by mouth 3 (three) times daily. 01/11/16   Brayton Mars, MD  oseltamivir (TAMIFLU) 75 MG capsule Take 1 capsule (75 mg total) by mouth 2 (two) times daily. 11/04/16   Norval Gable, MD    Family History Family History  Problem Relation Age of Onset  . Endometriosis Mother   . Diabetes Paternal Grandfather   . Prostate cancer Maternal Uncle   . Heart disease Neg Hx   . Breast cancer Neg Hx   . Ovarian cancer Neg Hx     Social History Social History  Substance Use Topics  . Smoking status: Never Smoker  . Smokeless tobacco: Never Used  . Alcohol use Yes     Comment: rare     Allergies   Codeine; Levaquin [levofloxacin in d5w]; Percocet [oxycodone-acetaminophen]; Sulfa antibiotics; Suprax [cefixime]; and  Vicodin [hydrocodone-acetaminophen]   Review of Systems Review of Systems  Constitutional: Positive for fever.  HENT: Negative for sinus pain.   Respiratory: Positive for cough. Negative for wheezing.   Musculoskeletal: Positive for myalgias.  Neurological: Positive for headaches.     Physical Exam Triage Vital Signs ED Triage Vitals  Enc Vitals Group     BP 11/04/16 1151 132/62     Pulse Rate 11/04/16 1151 (!) 117     Resp 11/04/16 1151 16     Temp 11/04/16 1151 (!) 101.5 F (38.6 C)     Temp Source 11/04/16 1151 Oral     SpO2 11/04/16 1151 98 %     Weight 11/04/16 1152 153 lb (69.4 kg)     Height 11/04/16 1152 5\' 4"  (1.626 m)     Head Circumference --      Peak Flow --      Pain Score 11/04/16 1156 7     Pain Loc --      Pain Edu? --      Excl. in [place]? --    No data found.   Updated Vital Signs BP 132/62 (BP Location: Left Arm)   Pulse (!) 117   Temp (!) 101.5 F (38.6 C) (Oral)   Resp 16   Ht 5\' 4"  (1.626 m)   Wt 153 lb (69.4 kg)   LMP 10/23/2016   SpO2 98%   BMI 26.26 kg/m   Visual Acuity Right Eye Distance:   Left Eye Distance:   Bilateral Distance:    Right Eye Near:   Left Eye Near:    Bilateral Near:     Physical Exam  Constitutional: She appears well-developed and well-nourished. No distress.  HENT:  Head: Normocephalic and atraumatic.  Right Ear: Tympanic membrane, external ear and ear canal normal.  Left Ear: Tympanic membrane, external ear and ear canal normal.  Nose: Rhinorrhea present. No mucosal edema, nose lacerations, sinus tenderness, nasal deformity, septal deviation or nasal septal hematoma. No epistaxis.  No foreign bodies. Right sinus exhibits no maxillary sinus tenderness and no frontal sinus tenderness. Left sinus exhibits no maxillary sinus tenderness and no frontal sinus tenderness.  Mouth/Throat: Uvula is midline, oropharynx is clear and moist and mucous membranes are normal. No oropharyngeal exudate.  Eyes: Conjunctivae and  EOM are normal. Pupils are equal, round, and reactive to light. Right eye exhibits no discharge. Left eye exhibits no discharge. No scleral icterus.  Neck: Normal range of motion. Neck supple. No thyromegaly present.  Cardiovascular: Regular rhythm and normal heart sounds.  Tachycardia present.   Pulmonary/Chest: Effort normal and breath sounds normal. No respiratory distress. She has no wheezes. She has no rales.  Lymphadenopathy:    She has no cervical adenopathy.  Skin: No rash noted. She is not diaphoretic.  Nursing note and vitals reviewed.    UC Treatments / Results  Labs (all labs ordered are listed, but only abnormal results are displayed) Labs Reviewed  RAPID INFLUENZA A&B ANTIGENS ([place])    EKG  EKG Interpretation None       Radiology No results found.  Procedures Procedures (including critical care time)  Medications Ordered in UC Medications - No data to display   Initial Impression / Assessment and Plan / UC Course  I have reviewed the triage vital signs and the nursing notes.  Pertinent labs & imaging results that were available during my care of the patient were reviewed by me and considered in my medical decision making (see chart for details).  Clinical Course       Final Clinical Impressions(s) / UC Diagnoses   Final diagnoses:  Influenza-like illness    New Prescriptions Discharge Medication List as of 11/04/2016 12:15 PM    START taking these medications   Details  oseltamivir (TAMIFLU) 75 MG capsule Take 1 capsule (75 mg total) by mouth 2 (two) times daily., Starting Fri 11/04/2016, Normal       1. Lab results and diagnosis reviewed with patient 2. rx as per orders above; reviewed possible side effects, interactions, risks and benefits  3. Recommend supportive treatment with rest, increased fluids, otc acetaminophen prn  4. Follow-up prn if symptoms worsen or don't improve   Norval Gable, MD 11/04/16 1224

## 2016-11-22 DIAGNOSIS — M9903 Segmental and somatic dysfunction of lumbar region: Secondary | ICD-10-CM | POA: Diagnosis not present

## 2016-11-29 DIAGNOSIS — M9903 Segmental and somatic dysfunction of lumbar region: Secondary | ICD-10-CM | POA: Diagnosis not present

## 2016-12-06 DIAGNOSIS — M9903 Segmental and somatic dysfunction of lumbar region: Secondary | ICD-10-CM | POA: Diagnosis not present

## 2016-12-13 DIAGNOSIS — M9903 Segmental and somatic dysfunction of lumbar region: Secondary | ICD-10-CM | POA: Diagnosis not present

## 2016-12-27 DIAGNOSIS — M5386 Other specified dorsopathies, lumbar region: Secondary | ICD-10-CM | POA: Diagnosis not present

## 2016-12-27 DIAGNOSIS — M9908 Segmental and somatic dysfunction of rib cage: Secondary | ICD-10-CM | POA: Diagnosis not present

## 2016-12-27 DIAGNOSIS — M9901 Segmental and somatic dysfunction of cervical region: Secondary | ICD-10-CM | POA: Diagnosis not present

## 2017-01-10 DIAGNOSIS — M531 Cervicobrachial syndrome: Secondary | ICD-10-CM | POA: Diagnosis not present

## 2017-01-10 DIAGNOSIS — M9903 Segmental and somatic dysfunction of lumbar region: Secondary | ICD-10-CM | POA: Diagnosis not present

## 2017-01-10 DIAGNOSIS — M5386 Other specified dorsopathies, lumbar region: Secondary | ICD-10-CM | POA: Diagnosis not present

## 2017-01-24 DIAGNOSIS — M9901 Segmental and somatic dysfunction of cervical region: Secondary | ICD-10-CM | POA: Diagnosis not present

## 2017-01-24 DIAGNOSIS — M5386 Other specified dorsopathies, lumbar region: Secondary | ICD-10-CM | POA: Diagnosis not present

## 2017-01-24 DIAGNOSIS — M9903 Segmental and somatic dysfunction of lumbar region: Secondary | ICD-10-CM | POA: Diagnosis not present

## 2017-02-14 DIAGNOSIS — M9903 Segmental and somatic dysfunction of lumbar region: Secondary | ICD-10-CM | POA: Diagnosis not present

## 2017-02-14 DIAGNOSIS — M5386 Other specified dorsopathies, lumbar region: Secondary | ICD-10-CM | POA: Diagnosis not present

## 2017-02-14 DIAGNOSIS — M531 Cervicobrachial syndrome: Secondary | ICD-10-CM | POA: Diagnosis not present

## 2017-03-01 DIAGNOSIS — M531 Cervicobrachial syndrome: Secondary | ICD-10-CM | POA: Diagnosis not present

## 2017-03-01 DIAGNOSIS — M5386 Other specified dorsopathies, lumbar region: Secondary | ICD-10-CM | POA: Diagnosis not present

## 2017-03-01 DIAGNOSIS — M9902 Segmental and somatic dysfunction of thoracic region: Secondary | ICD-10-CM | POA: Diagnosis not present

## 2017-03-02 DIAGNOSIS — D044 Carcinoma in situ of skin of scalp and neck: Secondary | ICD-10-CM | POA: Diagnosis not present

## 2017-03-02 DIAGNOSIS — D485 Neoplasm of uncertain behavior of skin: Secondary | ICD-10-CM | POA: Diagnosis not present

## 2017-03-02 DIAGNOSIS — Z85828 Personal history of other malignant neoplasm of skin: Secondary | ICD-10-CM | POA: Diagnosis not present

## 2017-03-06 NOTE — Progress Notes (Signed)
Patient ID: Diana Brooks, female   DOB: Apr 13, 1981, 36 y.o.   MRN: [id number] ANNUAL PREVENTATIVE CARE GYN  ENCOUNTER NOTE  Subjective:       Diana Brooks is a 36 y.o. G77P1001 female here for a routine annual gynecologic exam.  Current complaints: 1. Left breast feels lumpy- no pain, redness, or nipple d/c  Symptoms of left breast have been present for about 2 months. Cycles are regular on a monthly basis; duration of flow is 5-7 days and mild dysmenorrhea is notable. Attempting conception has been ongoing for the last 3 years without success; she did see a reproductive endocrinologist in [place] performed 1 IUI treatment without success. She is taking a break from aggressive conception trial at this time.   Gynecologic History Patient's last menstrual period was 02/13/2017 (exact date). Contraception: none Last Pap: 03/29/2016 PAP W.RFLX. Neg Results were: normal Last mammogram: N/A. Results were: N/A  Obstetric History OB History  Gravida Para Term Preterm AB Living  1 1 1     1   SAB TAB Ectopic Multiple Live Births          1    # Outcome Date GA Lbr Len/2nd Weight Sex Delivery Anes PTL Lv  1 Term    8 lb 1.6 oz (3.674 kg) M CS-LTranv   LIV      Past Medical History:  Diagnosis Date  . Anemia   . Anxiety   . Asthma    exercise induced  . Complication of anesthesia   . Dysmenorrhea   . Endometriosis   . GDM (gestational diabetes mellitus)   . Hyperprolactinemia ([place])   . IBS (irritable bowel syndrome)   . Infertility, female   . PONV (postoperative nausea and vomiting)    states phenergan helps alot post op n/v    Past Surgical History:  Procedure Laterality Date  . CESAREAN SECTION    . CHROMOPERTUBATION N/A 01/11/2016   Procedure: CHROMOPERTUBATION OF FALLOPIAN TUBES;  Surgeon: Brayton Mars, MD;  Location: [facility];  Service: Gynecology;  Laterality: N/A;  . LAPAROSCOPY     lap with excision of fulguration of endometriosis  . LAPAROSCOPY N/A  01/11/2016   Procedure: LAPAROSCOPY DIAGNOSTIC/ WITH EXCISION AND FULGERATION OF ENDOMETRIOSIS; MYOMECTOMY;  Surgeon: Brayton Mars, MD;  Location: [facility] OR[facility];  Service: Gynecology;  Laterality: N/A;  . wrist srugery     ganglion cyst removed    Current Outpatient Prescriptions on File Prior to Visit  Medication Sig Dispense Refill  . acetaminophen (TYLENOL) 500 MG tablet Take 500 mg by mouth every 6 (six) hours as needed.    Marland Kitchen albuterol (PROVENTIL HFA;VENTOLIN HFA) 108 (90 BASE) MCG/ACT inhaler Inhale into the lungs every 6 (six) hours as needed for wheezing or shortness of breath.    . ALPRAZolam (XANAX) 0.25 MG tablet Take 0.25 mg by mouth at bedtime as needed for anxiety.    . Cholecalciferol (VITAMIN D HIGH POTENCY PO) Take 5,000 Units by mouth daily.    Marland Kitchen ibuprofen (ADVIL,MOTRIN) 800 MG tablet Take 1 tablet (800 mg total) by mouth 3 (three) times daily. 30 tablet 1  . Multiple Vitamin (MULTIVITAMIN) tablet Take 1 tablet by mouth daily.    Marland Kitchen oseltamivir (TAMIFLU) 75 MG capsule Take 1 capsule (75 mg total) by mouth 2 (two) times daily. 10 capsule 0   No current facility-administered medications on file prior to visit.     Allergies  Allergen Reactions  . Codeine Nausea And Vomiting  . Levaquin [  Levofloxacin In D5w]   . Percocet [Oxycodone-Acetaminophen] Nausea And Vomiting  . Sulfa Antibiotics   . Suprax [Cefixime]   . Vicodin [Hydrocodone-Acetaminophen] Nausea And Vomiting    Social History   Social History  . Marital status: Married    Spouse name: N/A  . Number of children: N/A  . Years of education: N/A   Occupational History  . Not on file.   Social History Main Topics  . Smoking status: Never Smoker  . Smokeless tobacco: Never Used  . Alcohol use Yes     Comment: rare  . Drug use: No  . Sexual activity: Yes    Birth control/ protection: None   Other Topics Concern  . Not on file   Social History Narrative  . No narrative on file    Family History   Problem Relation Age of Onset  . Endometriosis Mother   . Diabetes Paternal Grandfather   . Prostate cancer Maternal Uncle   . Heart disease Neg Hx   . Breast cancer Neg Hx   . Ovarian cancer Neg Hx     The following portions of the patient's history were reviewed and updated as appropriate: allergies, current medications, past family history, past medical history, past social history, past surgical history and problem list.  Review of Systems Review of Systems  Constitutional: Negative.   Cardiovascular: Negative.   Gastrointestinal: Negative.   Genitourinary: Negative.   Musculoskeletal: Negative.   Skin: Negative.   Neurological: Negative.   Endo/Heme/Allergies: Negative.   Psychiatric/Behavioral: The patient is nervous/anxious.       Objective:   BP 113/71   Pulse 86   Ht 5\' 4"  (1.626 m)   Wt 158 lb 1.6 oz (71.7 kg)   LMP 02/13/2017 (Exact Date)   BMI 27.14 kg/m CONSTITUTIONAL: Well-developed, well-nourished female in no acute distress.  PSYCHIATRIC: Normal mood and affect. Normal behavior. Normal judgment and thought content. [place]: Alert and oriented to person, place, and time. Normal muscle tone coordination. No cranial nerve deficit noted. HENT:  Normocephalic, atraumatic, External right and left ear normal. Oropharynx is clear and moist EYES: Conjunctivae and EOM are normal. Pupils are equal, round, and reactive to light. No scleral icterus.  NECK: Normal range of motion, supple, no masses.  Normal thyroid.  SKIN: Skin is warm and dry. No rash noted. Not diaphoretic. No erythema. No pallor. CARDIOVASCULAR: Normal heart rate noted, regular rhythm, no murmur. RESPIRATORY: Clear to auscultation bilaterally. Effort and breath sounds normal, no problems with respiration noted. BREASTS: Symmetric in size. No masses, skin changes, nipple drainage, or lymphadenopathy. ABDOMEN: Soft, normal bowel sounds, no distention noted.  No tenderness, rebound or guarding.   BLADDER: Normal PELVIC:  External Genitalia: Normal  BUS: Normal  Vagina: Normal  Cervix: Normal; No lesions; 1/4 cervical motion tenderness  Uterus: Normal; midplane, normal size and shape, mobile, 1/4 tender  Adnexa: Normal  RV: External Exam NormaI  MUSCULOSKELETAL: Normal range of motion. No tenderness.  No cyanosis, clubbing, or edema.  2+ distal pulses. LYMPHATIC: No Axillary, Supraclavicular, or Inguinal Adenopathy.    Assessment:   Annual gynecologic examination 36 y.o. Contraception: none BMI- 27 Endometriosis, stable Secondary infertility, currently attempting to conceive  Plan:  Pap: Pap w/hpv Mammogram: Not Indicated Stool Guaiac Testing:  Not Indicated Labs: THRU PCP Routine preventative health maintenance measures emphasized: Exercise/Diet/Weight control, Tobacco Warnings and Alcohol/Substance use risks Continue monitoring menstrual calendar cycles  Continue prenatal vitamins Reproductive endocrinology clinic referral is declined at this time Return to  Clinic - 1 [address] [place], [place]  Brayton Mars, MD   Note: This dictation was prepared with Dragon dictation along with smaller phrase technology. Any transcriptional errors that result from this process are unintentional.

## 2017-03-07 ENCOUNTER — Encounter: Payer: Self-pay | Admitting: Obstetrics and Gynecology

## 2017-03-07 ENCOUNTER — Ambulatory Visit (INDEPENDENT_AMBULATORY_CARE_PROVIDER_SITE_OTHER): Payer: BLUE CROSS/BLUE SHIELD | Admitting: Obstetrics and Gynecology

## 2017-03-07 VITALS — BP 113/71 | HR 86 | Ht 64.0 in | Wt 158.1 lb

## 2017-03-07 DIAGNOSIS — N809 Endometriosis, unspecified: Secondary | ICD-10-CM

## 2017-03-07 DIAGNOSIS — N6012 Diffuse cystic mastopathy of left breast: Secondary | ICD-10-CM

## 2017-03-07 DIAGNOSIS — N979 Female infertility, unspecified: Secondary | ICD-10-CM | POA: Diagnosis not present

## 2017-03-07 DIAGNOSIS — Z01419 Encounter for gynecological examination (general) (routine) without abnormal findings: Secondary | ICD-10-CM

## 2017-03-07 NOTE — Patient Instructions (Signed)
1. Pap smear with HPV testing is performed today 2. Self breast awareness is encouraged 3. Continue with healthy eating and exercise 4. Continue with multivitamin daily 5. Contraception-none 6. Screening labs are to be obtained through primary care 7. Return in 1 year 8. Contact us if referral to reproductive infertility specialist is desired   Health Maintenance, Female Adopting a healthy lifestyle and getting preventive care can go a long way to promote health and wellness. Talk with your health care provider about what schedule of regular examinations is right for you. This is a good chance for you to check in with your provider about disease prevention and staying healthy. In between checkups, there are plenty of things you can do on your own. Experts have done a lot of research about which lifestyle changes and preventive measures are most likely to keep you healthy. Ask your health care provider for more information. Weight and diet Eat a healthy diet  Be sure to include plenty of vegetables, fruits, low-fat dairy products, and lean protein.  Do not eat a lot of foods high in solid fats, added sugars, or salt.  Get regular exercise. This is one of the most important things you can do for your health.  Most adults should exercise for at least 150 minutes each week. The exercise should increase your heart rate and make you sweat (moderate-intensity exercise).  Most adults should also do strengthening exercises at least twice a week. This is in addition to the moderate-intensity exercise. Maintain a healthy weight  Body mass index (BMI) is a measurement that can be used to identify possible weight problems. It estimates body fat based on height and weight. Your health care provider can help determine your BMI and help you achieve or maintain a healthy weight.  For females 75 years of age and older:  A BMI below 18.5 is considered underweight.  A BMI of 18.5 to 24.9 is normal.  A  BMI of 25 to 29.9 is considered overweight.  A BMI of 30 and above is considered obese. Watch levels of cholesterol and blood lipids  You should start having your blood tested for lipids and cholesterol at 34 years of age, then have this test every 5 years.  You may need to have your cholesterol levels checked more often if:  Your lipid or cholesterol levels are high.  You are older than 34 years of age.  You are at high risk for heart disease. Cancer screening Lung Cancer  Lung cancer screening is recommended for adults 41-87 years old who are at high risk for lung cancer because of a history of smoking.  A yearly low-dose CT scan of the lungs is recommended for people who:  Currently smoke.  Have quit within the past 15 years.  Have at least a 30-pack-year history of smoking. A pack year is smoking an average of one pack of cigarettes a day for 1 year.  Yearly screening should continue until it has been 15 years since you quit.  Yearly screening should stop if you develop a health problem that would prevent you from having lung cancer treatment. Breast Cancer  Practice breast self-awareness. This means understanding how your breasts normally appear and feel.  It also means doing regular breast self-exams. Let your health care provider know about any changes, no matter how small.  If you are in your 20s or 30s, you should have a clinical breast exam (CBE) by a health care provider every 1-3 years as  part of a regular health exam.  If you are 40 or older, have a CBE every year. Also consider having a breast X-ray (mammogram) every year.  If you have a family history of breast cancer, talk to your health care provider about genetic screening.  If you are at high risk for breast cancer, talk to your health care provider about having an MRI and a mammogram every year.  Breast cancer gene (BRCA) assessment is recommended for women who have family members with BRCA-related  cancers. BRCA-related cancers include:  Breast.  Ovarian.  Tubal.  Peritoneal cancers.  Results of the assessment will determine the need for genetic counseling and BRCA1 and BRCA2 testing. Cervical Cancer  Your health care provider may recommend that you be screened regularly for cancer of the pelvic organs (ovaries, uterus, and vagina). This screening involves a pelvic examination, including checking for microscopic changes to the surface of your cervix (Pap test). You may be encouraged to have this screening done every 3 years, beginning at age 71.  For women ages 74-65, health care providers may recommend pelvic exams and Pap testing every 3 years, or they may recommend the Pap and pelvic exam, combined with testing for human papilloma virus (HPV), every 5 years. Some types of HPV increase your risk of cervical cancer. Testing for HPV may also be done on women of any age with unclear Pap test results.  Other health care providers may not recommend any screening for nonpregnant women who are considered low risk for pelvic cancer and who do not have symptoms. Ask your health care provider if a screening pelvic exam is right for you.  If you have had past treatment for cervical cancer or a condition that could lead to cancer, you need Pap tests and screening for cancer for at least 20 years after your treatment. If Pap tests have been discontinued, your risk factors (such as having a new sexual partner) need to be reassessed to determine if screening should resume. Some women have medical problems that increase the chance of getting cervical cancer. In these cases, your health care provider may recommend more frequent screening and Pap tests. Colorectal Cancer  This type of cancer can be detected and often prevented.  Routine colorectal cancer screening usually begins at 34 years of age and continues through 34 years of age.  Your health care provider may recommend screening at an earlier  age if you have risk factors for colon cancer.  Your health care provider may also recommend using home test kits to check for hidden blood in the stool.  A small camera at the end of a tube can be used to examine your colon directly (sigmoidoscopy or colonoscopy). This is done to check for the earliest forms of colorectal cancer.  Routine screening usually begins at age 84.  Direct examination of the colon should be repeated every 5-10 years through 34 years of age. However, you may need to be screened more often if early forms of precancerous polyps or small growths are found. Skin Cancer  Check your skin from head to toe regularly.  Tell your health care provider about any new moles or changes in moles, especially if there is a change in a mole's shape or color.  Also tell your health care provider if you have a mole that is larger than the size of a pencil eraser.  Always use sunscreen. Apply sunscreen liberally and repeatedly throughout the day.  Protect yourself by wearing long sleeves,  pants, a wide-brimmed hat, and sunglasses whenever you are outside. Heart disease, diabetes, and high blood pressure  High blood pressure causes heart disease and increases the risk of stroke. High blood pressure is more likely to develop in:  People who have blood pressure in the high end of the normal range (130-139/85-89 mm Hg).  People who are overweight or obese.  People who are African American.  If you are 52-63 years of age, have your blood pressure checked every 3-5 years. If you are 72 years of age or older, have your blood pressure checked every year. You should have your blood pressure measured twice-once when you are at a hospital or clinic, and once when you are not at a hospital or clinic. Record the average of the two measurements. To check your blood pressure when you are not at a hospital or clinic, you can use:  An automated blood pressure machine at a pharmacy.  A home blood  pressure monitor.  If you are between 93 years and 81 years old, ask your health care provider if you should take aspirin to prevent strokes.  Have regular diabetes screenings. This involves taking a blood sample to check your fasting blood sugar level.  If you are at a normal weight and have a low risk for diabetes, have this test once every three years after 34 years of age.  If you are overweight and have a high risk for diabetes, consider being tested at a younger age or more often. Preventing infection Hepatitis B  If you have a higher risk for hepatitis B, you should be screened for this virus. You are considered at high risk for hepatitis B if:  You were born in a country where hepatitis B is common. Ask your health care provider which countries are considered high risk.  Your parents were born in a high-risk country, and you have not been immunized against hepatitis B (hepatitis B vaccine).  You have HIV or AIDS.  You use needles to inject street drugs.  You live with someone who has hepatitis B.  You have had sex with someone who has hepatitis B.  You get hemodialysis treatment.  You take certain medicines for conditions, including cancer, organ transplantation, and autoimmune conditions. Hepatitis C  Blood testing is recommended for:  Everyone born from [birth year] through 1963.  Anyone with known risk factors for hepatitis C. Sexually transmitted infections (STIs)  You should be screened for sexually transmitted infections (STIs) including gonorrhea and chlamydia if:  You are sexually active and are younger than 34 years of age.  You are older than 34 years of age and your health care provider tells you that you are at risk for this type of infection.  Your sexual activity has changed since you were last screened and you are at an increased risk for chlamydia or gonorrhea. Ask your health care provider if you are at risk.  If you do not have HIV, but are at risk, it  may be recommended that you take a prescription medicine daily to prevent HIV infection. This is called pre-exposure prophylaxis (PrEP). You are considered at risk if:  You are sexually active and do not regularly use condoms or know the HIV status of your partner(s).  You take drugs by injection.  You are sexually active with a partner who has HIV. Talk with your health care provider about whether you are at high risk of being infected with HIV. If you choose to begin PrEP,  you should first be tested for HIV. You should then be tested every 3 months for as long as you are taking PrEP. Pregnancy  If you are premenopausal and you may become pregnant, ask your health care provider about preconception counseling.  If you may become pregnant, take 400 to 800 micrograms (mcg) of folic acid every day.  If you want to prevent pregnancy, talk to your health care provider about birth control (contraception). Osteoporosis and menopause  Osteoporosis is a disease in which the bones lose minerals and strength with aging. This can result in serious bone fractures. Your risk for osteoporosis can be identified using a bone density scan.  If you are 3 years of age or older, or if you are at risk for osteoporosis and fractures, ask your health care provider if you should be screened.  Ask your health care provider whether you should take a calcium or vitamin D supplement to lower your risk for osteoporosis.  Menopause may have certain physical symptoms and risks.  Hormone replacement therapy may reduce some of these symptoms and risks. Talk to your health care provider about whether hormone replacement therapy is right for you. Follow these instructions at home:  Schedule regular health, dental, and eye exams.  Stay current with your immunizations.  Do not use any tobacco products including cigarettes, chewing tobacco, or electronic cigarettes.  If you are pregnant, do not drink alcohol.  If you  are breastfeeding, limit how much and how often you drink alcohol.  Limit alcohol intake to no more than 1 drink per day for nonpregnant women. One drink equals 12 ounces of beer, 5 ounces of wine, or 1 ounces of hard liquor.  Do not use street drugs.  Do not share needles.  Ask your health care provider for help if you need support or information about quitting drugs.  Tell your health care provider if you often feel depressed.  Tell your health care provider if you have ever been abused or do not feel safe at home. This information is not intended to replace advice given to you by your health care provider. Make sure you discuss any questions you have with your health care provider. Document Released: 05/23/2011 Document Revised: 04/14/2016 Document Reviewed: 08/11/2015 Elsevier Interactive Patient Education  2017 [employer].

## 2017-03-09 LAB — PAP IG AND HPV HIGH-RISK
HPV, HIGH-RISK: NEGATIVE
PAP SMEAR COMMENT: 0

## 2017-03-14 DIAGNOSIS — M531 Cervicobrachial syndrome: Secondary | ICD-10-CM | POA: Diagnosis not present

## 2017-03-14 DIAGNOSIS — M5387 Other specified dorsopathies, lumbosacral region: Secondary | ICD-10-CM | POA: Diagnosis not present

## 2017-03-14 DIAGNOSIS — M9903 Segmental and somatic dysfunction of lumbar region: Secondary | ICD-10-CM | POA: Diagnosis not present

## 2017-03-28 DIAGNOSIS — M9902 Segmental and somatic dysfunction of thoracic region: Secondary | ICD-10-CM | POA: Diagnosis not present

## 2017-03-28 DIAGNOSIS — M9903 Segmental and somatic dysfunction of lumbar region: Secondary | ICD-10-CM | POA: Diagnosis not present

## 2017-03-30 DIAGNOSIS — Z85828 Personal history of other malignant neoplasm of skin: Secondary | ICD-10-CM | POA: Diagnosis not present

## 2017-03-30 DIAGNOSIS — D099 Carcinoma in situ, unspecified: Secondary | ICD-10-CM | POA: Diagnosis not present

## 2017-03-31 DIAGNOSIS — M9903 Segmental and somatic dysfunction of lumbar region: Secondary | ICD-10-CM | POA: Diagnosis not present

## 2017-03-31 DIAGNOSIS — M9902 Segmental and somatic dysfunction of thoracic region: Secondary | ICD-10-CM | POA: Diagnosis not present

## 2017-04-11 DIAGNOSIS — M9901 Segmental and somatic dysfunction of cervical region: Secondary | ICD-10-CM | POA: Diagnosis not present

## 2017-04-11 DIAGNOSIS — M9903 Segmental and somatic dysfunction of lumbar region: Secondary | ICD-10-CM | POA: Diagnosis not present

## 2017-04-11 DIAGNOSIS — M5386 Other specified dorsopathies, lumbar region: Secondary | ICD-10-CM | POA: Diagnosis not present

## 2017-04-11 DIAGNOSIS — M531 Cervicobrachial syndrome: Secondary | ICD-10-CM | POA: Diagnosis not present

## 2017-04-25 DIAGNOSIS — M25631 Stiffness of right wrist, not elsewhere classified: Secondary | ICD-10-CM | POA: Diagnosis not present

## 2017-05-02 DIAGNOSIS — M531 Cervicobrachial syndrome: Secondary | ICD-10-CM | POA: Diagnosis not present

## 2017-05-02 DIAGNOSIS — M9901 Segmental and somatic dysfunction of cervical region: Secondary | ICD-10-CM | POA: Diagnosis not present

## 2017-05-02 DIAGNOSIS — M9902 Segmental and somatic dysfunction of thoracic region: Secondary | ICD-10-CM | POA: Diagnosis not present

## 2017-05-16 DIAGNOSIS — M531 Cervicobrachial syndrome: Secondary | ICD-10-CM | POA: Diagnosis not present

## 2017-05-16 DIAGNOSIS — M9902 Segmental and somatic dysfunction of thoracic region: Secondary | ICD-10-CM | POA: Diagnosis not present

## 2017-05-16 DIAGNOSIS — M9901 Segmental and somatic dysfunction of cervical region: Secondary | ICD-10-CM | POA: Diagnosis not present

## 2017-05-18 DIAGNOSIS — M722 Plantar fascial fibromatosis: Secondary | ICD-10-CM | POA: Diagnosis not present

## 2017-05-18 DIAGNOSIS — M9905 Segmental and somatic dysfunction of pelvic region: Secondary | ICD-10-CM | POA: Diagnosis not present

## 2017-05-30 DIAGNOSIS — M531 Cervicobrachial syndrome: Secondary | ICD-10-CM | POA: Diagnosis not present

## 2017-05-30 DIAGNOSIS — M5387 Other specified dorsopathies, lumbosacral region: Secondary | ICD-10-CM | POA: Diagnosis not present

## 2017-05-30 DIAGNOSIS — M722 Plantar fascial fibromatosis: Secondary | ICD-10-CM | POA: Diagnosis not present

## 2017-05-31 DIAGNOSIS — C4490 Unspecified malignant neoplasm of skin, unspecified: Secondary | ICD-10-CM | POA: Diagnosis not present

## 2017-05-31 DIAGNOSIS — Z85828 Personal history of other malignant neoplasm of skin: Secondary | ICD-10-CM | POA: Diagnosis not present

## 2017-06-16 DIAGNOSIS — M5387 Other specified dorsopathies, lumbosacral region: Secondary | ICD-10-CM | POA: Diagnosis not present

## 2017-06-16 DIAGNOSIS — M9904 Segmental and somatic dysfunction of sacral region: Secondary | ICD-10-CM | POA: Diagnosis not present

## 2017-06-20 DIAGNOSIS — M9904 Segmental and somatic dysfunction of sacral region: Secondary | ICD-10-CM | POA: Diagnosis not present

## 2017-06-20 DIAGNOSIS — M5387 Other specified dorsopathies, lumbosacral region: Secondary | ICD-10-CM | POA: Diagnosis not present

## 2017-06-23 DIAGNOSIS — M9904 Segmental and somatic dysfunction of sacral region: Secondary | ICD-10-CM | POA: Diagnosis not present

## 2017-06-23 DIAGNOSIS — M5387 Other specified dorsopathies, lumbosacral region: Secondary | ICD-10-CM | POA: Diagnosis not present

## 2017-07-04 DIAGNOSIS — M5387 Other specified dorsopathies, lumbosacral region: Secondary | ICD-10-CM | POA: Diagnosis not present

## 2017-07-04 DIAGNOSIS — M9903 Segmental and somatic dysfunction of lumbar region: Secondary | ICD-10-CM | POA: Diagnosis not present

## 2017-07-04 DIAGNOSIS — M9904 Segmental and somatic dysfunction of sacral region: Secondary | ICD-10-CM | POA: Diagnosis not present

## 2017-07-18 DIAGNOSIS — M531 Cervicobrachial syndrome: Secondary | ICD-10-CM | POA: Diagnosis not present

## 2017-07-18 DIAGNOSIS — M9902 Segmental and somatic dysfunction of thoracic region: Secondary | ICD-10-CM | POA: Diagnosis not present

## 2017-07-18 DIAGNOSIS — M9901 Segmental and somatic dysfunction of cervical region: Secondary | ICD-10-CM | POA: Diagnosis not present

## 2017-08-02 DIAGNOSIS — M9901 Segmental and somatic dysfunction of cervical region: Secondary | ICD-10-CM | POA: Diagnosis not present

## 2017-08-02 DIAGNOSIS — M9902 Segmental and somatic dysfunction of thoracic region: Secondary | ICD-10-CM | POA: Diagnosis not present

## 2017-08-02 DIAGNOSIS — M531 Cervicobrachial syndrome: Secondary | ICD-10-CM | POA: Diagnosis not present

## 2017-08-11 DIAGNOSIS — H5203 Hypermetropia, bilateral: Secondary | ICD-10-CM | POA: Diagnosis not present

## 2017-08-15 DIAGNOSIS — M5387 Other specified dorsopathies, lumbosacral region: Secondary | ICD-10-CM | POA: Diagnosis not present

## 2017-08-15 DIAGNOSIS — M9901 Segmental and somatic dysfunction of cervical region: Secondary | ICD-10-CM | POA: Diagnosis not present

## 2017-08-15 DIAGNOSIS — M531 Cervicobrachial syndrome: Secondary | ICD-10-CM | POA: Diagnosis not present

## 2017-08-31 DIAGNOSIS — Z85828 Personal history of other malignant neoplasm of skin: Secondary | ICD-10-CM | POA: Diagnosis not present

## 2017-08-31 DIAGNOSIS — L905 Scar conditions and fibrosis of skin: Secondary | ICD-10-CM | POA: Diagnosis not present

## 2017-08-31 DIAGNOSIS — D225 Melanocytic nevi of trunk: Secondary | ICD-10-CM | POA: Diagnosis not present

## 2017-08-31 DIAGNOSIS — L570 Actinic keratosis: Secondary | ICD-10-CM | POA: Diagnosis not present

## 2017-08-31 DIAGNOSIS — D485 Neoplasm of uncertain behavior of skin: Secondary | ICD-10-CM | POA: Diagnosis not present

## 2017-09-05 DIAGNOSIS — M5442 Lumbago with sciatica, left side: Secondary | ICD-10-CM | POA: Diagnosis not present

## 2017-09-05 DIAGNOSIS — M5387 Other specified dorsopathies, lumbosacral region: Secondary | ICD-10-CM | POA: Diagnosis not present

## 2017-09-05 DIAGNOSIS — M9903 Segmental and somatic dysfunction of lumbar region: Secondary | ICD-10-CM | POA: Diagnosis not present

## 2017-09-05 DIAGNOSIS — M722 Plantar fascial fibromatosis: Secondary | ICD-10-CM | POA: Diagnosis not present

## 2017-09-19 DIAGNOSIS — M25631 Stiffness of right wrist, not elsewhere classified: Secondary | ICD-10-CM | POA: Diagnosis not present

## 2017-09-19 DIAGNOSIS — M25632 Stiffness of left wrist, not elsewhere classified: Secondary | ICD-10-CM | POA: Diagnosis not present

## 2017-09-19 DIAGNOSIS — M9902 Segmental and somatic dysfunction of thoracic region: Secondary | ICD-10-CM | POA: Diagnosis not present

## 2017-09-19 DIAGNOSIS — M531 Cervicobrachial syndrome: Secondary | ICD-10-CM | POA: Diagnosis not present

## 2017-10-03 DIAGNOSIS — M9901 Segmental and somatic dysfunction of cervical region: Secondary | ICD-10-CM | POA: Diagnosis not present

## 2017-10-03 DIAGNOSIS — M722 Plantar fascial fibromatosis: Secondary | ICD-10-CM | POA: Diagnosis not present

## 2017-10-03 DIAGNOSIS — M9903 Segmental and somatic dysfunction of lumbar region: Secondary | ICD-10-CM | POA: Diagnosis not present

## 2017-10-10 DIAGNOSIS — M9901 Segmental and somatic dysfunction of cervical region: Secondary | ICD-10-CM | POA: Diagnosis not present

## 2017-10-10 DIAGNOSIS — M722 Plantar fascial fibromatosis: Secondary | ICD-10-CM | POA: Diagnosis not present

## 2017-10-24 DIAGNOSIS — M5387 Other specified dorsopathies, lumbosacral region: Secondary | ICD-10-CM | POA: Diagnosis not present

## 2017-10-24 DIAGNOSIS — M7611 Psoas tendinitis, right hip: Secondary | ICD-10-CM | POA: Diagnosis not present

## 2017-11-07 DIAGNOSIS — M531 Cervicobrachial syndrome: Secondary | ICD-10-CM | POA: Diagnosis not present

## 2017-11-07 DIAGNOSIS — M25651 Stiffness of right hip, not elsewhere classified: Secondary | ICD-10-CM | POA: Diagnosis not present

## 2017-11-07 DIAGNOSIS — M9903 Segmental and somatic dysfunction of lumbar region: Secondary | ICD-10-CM | POA: Diagnosis not present

## 2017-11-07 DIAGNOSIS — M5387 Other specified dorsopathies, lumbosacral region: Secondary | ICD-10-CM | POA: Diagnosis not present

## 2017-11-28 DIAGNOSIS — M9902 Segmental and somatic dysfunction of thoracic region: Secondary | ICD-10-CM | POA: Diagnosis not present

## 2017-11-28 DIAGNOSIS — M9901 Segmental and somatic dysfunction of cervical region: Secondary | ICD-10-CM | POA: Diagnosis not present

## 2017-11-28 DIAGNOSIS — M9903 Segmental and somatic dysfunction of lumbar region: Secondary | ICD-10-CM | POA: Diagnosis not present

## 2017-12-20 DIAGNOSIS — M722 Plantar fascial fibromatosis: Secondary | ICD-10-CM | POA: Diagnosis not present

## 2017-12-20 DIAGNOSIS — M9903 Segmental and somatic dysfunction of lumbar region: Secondary | ICD-10-CM | POA: Diagnosis not present

## 2017-12-20 DIAGNOSIS — M9901 Segmental and somatic dysfunction of cervical region: Secondary | ICD-10-CM | POA: Diagnosis not present

## 2018-01-02 DIAGNOSIS — G44219 Episodic tension-type headache, not intractable: Secondary | ICD-10-CM | POA: Diagnosis not present

## 2018-01-02 DIAGNOSIS — M9901 Segmental and somatic dysfunction of cervical region: Secondary | ICD-10-CM | POA: Diagnosis not present

## 2018-01-02 DIAGNOSIS — M530 Cervicocranial syndrome: Secondary | ICD-10-CM | POA: Diagnosis not present

## 2018-01-02 DIAGNOSIS — M5387 Other specified dorsopathies, lumbosacral region: Secondary | ICD-10-CM | POA: Diagnosis not present

## 2018-01-16 DIAGNOSIS — M436 Torticollis: Secondary | ICD-10-CM | POA: Diagnosis not present

## 2018-01-16 DIAGNOSIS — M9901 Segmental and somatic dysfunction of cervical region: Secondary | ICD-10-CM | POA: Diagnosis not present

## 2018-01-16 DIAGNOSIS — M5386 Other specified dorsopathies, lumbar region: Secondary | ICD-10-CM | POA: Diagnosis not present

## 2018-01-30 DIAGNOSIS — M5386 Other specified dorsopathies, lumbar region: Secondary | ICD-10-CM | POA: Diagnosis not present

## 2018-01-30 DIAGNOSIS — M9903 Segmental and somatic dysfunction of lumbar region: Secondary | ICD-10-CM | POA: Diagnosis not present

## 2018-01-30 DIAGNOSIS — M722 Plantar fascial fibromatosis: Secondary | ICD-10-CM | POA: Diagnosis not present

## 2018-02-13 DIAGNOSIS — M9903 Segmental and somatic dysfunction of lumbar region: Secondary | ICD-10-CM | POA: Diagnosis not present

## 2018-02-13 DIAGNOSIS — M7612 Psoas tendinitis, left hip: Secondary | ICD-10-CM | POA: Diagnosis not present

## 2018-02-13 DIAGNOSIS — M5386 Other specified dorsopathies, lumbar region: Secondary | ICD-10-CM | POA: Diagnosis not present

## 2018-02-26 NOTE — Progress Notes (Signed)
Patient ID: Diana Brooks, female   DOB: June 19, 1981, 37 y.o.   MRN: [id number] ANNUAL PREVENTATIVE CARE GYN  ENCOUNTER NOTE  Subjective:       Diana Brooks is a 37 y.o. G62P1001 female here for a routine annual gynecologic exam.  Current complaints: 1. 2 months having sx of pregnancy- twinge pain- low pelvis; menstrual cycles are regular approximately 29-day intervals; duration of flow is 5-7 days and mild dysmenorrhea is notable. Attempting conception has been ongoing for the last 3.5 years without success; she did see a reproductive endocrinologist in [place] performed 1 IUI treatment without success. She is taking a break from aggressive conception trial at this time.  She has noted bilateral clear nipple discharge recently.  Pelvic cramping with menses tends to wax and wane.  Bowel function and bladder function are normal.  No major interval health issues have been identified.  There has been recent family stressors including death of family member in Jan 09, 2023, as well as change in work spouse   Gynecologic History lmp- 02/13/2018 Contraception: none Last Pap: 03/10/2017 neg/neg Results were: normal Last mammogram: N/A. Results were: N/A  Obstetric History OB History  Gravida Para Term Preterm AB Living  1 1 1     1   SAB TAB Ectopic Multiple Live Births          1    # Outcome Date GA Lbr Len/2nd Weight Sex Delivery Anes PTL Lv  1 Term 2013   8 lb 1.6 oz (3.674 kg) M CS-LTranv   LIV    Past Medical History:  Diagnosis Date  . Anemia   . Anxiety   . Asthma    exercise induced  . Complication of anesthesia   . Dysmenorrhea   . Endometriosis   . GDM (gestational diabetes mellitus)   . Hyperprolactinemia ([place])   . IBS (irritable bowel syndrome)   . Infertility, female   . PONV (postoperative nausea and vomiting)    states phenergan helps alot post op n/v    Past Surgical History:  Procedure Laterality Date  . CESAREAN SECTION    . CHROMOPERTUBATION N/A 01/11/2016    Procedure: CHROMOPERTUBATION OF FALLOPIAN TUBES;  Surgeon: Brayton Mars, MD;  Location: [facility] ORS;  Service: Gynecology;  Laterality: N/A;  . LAPAROSCOPY     lap with excision of fulguration of endometriosis  . LAPAROSCOPY N/A 01/11/2016   Procedure: LAPAROSCOPY DIAGNOSTIC/ WITH EXCISION AND FULGERATION OF ENDOMETRIOSIS; MYOMECTOMY;  Surgeon: Brayton Mars, MD;  Location: [facility] ORS;  Service: Gynecology;  Laterality: N/A;  . wrist srugery     ganglion cyst removed    Current Outpatient Medications on File Prior to Visit  Medication Sig Dispense Refill  . acetaminophen (TYLENOL) 500 MG tablet Take 500 mg by mouth every 6 (six) hours as needed.    Marland Kitchen albuterol (PROVENTIL HFA;VENTOLIN HFA) 108 (90 BASE) MCG/ACT inhaler Inhale into the lungs every 6 (six) hours as needed for wheezing or shortness of breath.    . Cholecalciferol (VITAMIN D HIGH POTENCY PO) Take 5,000 Units by mouth daily.    Marland Kitchen ibuprofen (ADVIL,MOTRIN) 800 MG tablet Take 1 tablet (800 mg total) by mouth 3 (three) times daily. 30 tablet 1  . Multiple Vitamin (MULTIVITAMIN) tablet Take 1 tablet by mouth daily.     No current facility-administered medications on file prior to visit.     Allergies  Allergen Reactions  . Codeine Nausea And Vomiting  . Levaquin [Levofloxacin In D5w]   . Percocet [  Oxycodone-Acetaminophen] Nausea And Vomiting  . Sulfa Antibiotics   . Suprax [Cefixime]   . Vicodin [Hydrocodone-Acetaminophen] Nausea And Vomiting    Social History   Socioeconomic History  . Marital status: Married    Spouse name: Not on file  . Number of children: Not on file  . Years of education: Not on file  . Highest education level: Not on file  Occupational History  . Not on file  Social Needs  . Financial resource strain: Not on file  . Food insecurity:    Worry: Not on file    Inability: Not on file  . Transportation needs:    Medical: Not on file    Non-medical: Not on file  Tobacco Use  .  Smoking status: Never Smoker  . Smokeless tobacco: Never Used  Substance and Sexual Activity  . Alcohol use: Yes    Comment: rare  . Drug use: No  . Sexual activity: Yes    Birth control/protection: None  Lifestyle  . Physical activity:    Days per week: Not on file    Minutes per session: Not on file  . Stress: Not on file  Relationships  . Social connections:    Talks on phone: Not on file    Gets together: Not on file    Attends religious service: Not on file    Active member of club or organization: Not on file    Attends meetings of clubs or organizations: Not on file    Relationship status: Not on file  . Intimate partner violence:    Fear of current or ex partner: Not on file    Emotionally abused: Not on file    Physically abused: Not on file    Forced sexual activity: Not on file  Other Topics Concern  . Not on file  Social History Narrative  . Not on file    Family History  Problem Relation Age of Onset  . Endometriosis Mother   . Diabetes Paternal Grandfather   . Prostate cancer Maternal Uncle   . Heart disease Neg Hx   . Breast cancer Neg Hx   . Ovarian cancer Neg Hx     The following portions of the patient's history were reviewed and updated as appropriate: allergies, current medications, past family history, past medical history, past social history, past surgical history and problem list.  Review of Systems Review of Systems  Constitutional: Negative.   HENT: Negative.   Eyes: Negative.   Respiratory: Negative.   Gastrointestinal: Negative.        Pelvic cramping tends to wax and wane with cycles-long history of endometriosis  Genitourinary: Negative.        Bilateral clear nipple discharge  Musculoskeletal: Negative.   Skin: Negative.   Neurological: Negative.   Endo/Heme/Allergies: Negative.   Psychiatric/Behavioral: Negative.      Objective:   BP 113/74   Pulse 79   Ht 5\' 4"  (1.626 m)   Wt 159 lb 1.6 oz (72.2 kg)   LMP 02/13/2018  (Approximate)   BMI 27.31 kg/m   CONSTITUTIONAL: Well-developed, well-nourished female in no acute distress.  PSYCHIATRIC: Normal mood and affect. Normal behavior. Normal judgment and thought content. [place]: Alert and oriented to person, place, and time. Normal muscle tone coordination. No cranial nerve deficit noted. HENT:  Normocephalic, atraumatic, External right and left ear normal.  EYES: Conjunctivae and EOM are normal. No scleral icterus.  NECK: Normal range of motion, supple, no masses.  Normal thyroid.  SKIN: Skin is warm and dry. No rash noted. Not diaphoretic. No erythema. No pallor. CARDIOVASCULAR: Normal heart rate noted, regular rhythm, no murmur. RESPIRATORY: Clear to auscultation bilaterally. Effort and breath sounds normal, no problems with respiration noted. BREASTS: Symmetric in size. No masses, skin changes; bilateral nipple discharge-clear;, no lymphadenopathy. ABDOMEN: Soft, normal bowel sounds, no distention noted.  No tenderness, rebound or guarding.  Pfannenstiel incision well-healed BLADDER: Normal PELVIC:  External Genitalia: Normal; resolving folliculitis left labia majora  BUS: Normal  Vagina: Normal  Cervix: Normal; No lesions; normal cervical mucus seen  Uterus: Normal; midplane, normal size and shape, mobile, 1/4 tender, mobile  Adnexa: Normal; right side nonpalpable, nontender; left side fullness without discrete mass and tender 1/4  RV: External Exam NormaI  MUSCULOSKELETAL: Normal range of motion. No tenderness.  No cyanosis, clubbing, or edema.  2+ distal pulses. LYMPHATIC: No Axillary, Supraclavicular, or Inguinal Adenopathy.    Assessment:   Annual gynecologic examination 37 y.o. Contraception: none BMI- 27 Endometriosis, stable Secondary infertility, currently attempting to conceive  Plan:  Pap:Due 2021- per pt request pap done Mammogram: Not Indicated Stool Guaiac Testing:  Not Indicated Labs: THRU PCP prolactin level is  ordered Routine preventative health maintenance measures emphasized: Exercise/Diet/Weight control, Tobacco Warnings and Alcohol/Substance use risks Continue monitoring menstrual calendar cycles  Continue prenatal vitamins Reproductive endocrinology clinic referral is requested at this time Return to [place] Return to clinic 6 months for follow-up on endometriosis  Joyice Faster, CMA  Brayton Mars, MD  Note: This dictation was prepared with Dragon dictation along with smaller phrase technology. Any transcriptional errors that result from this process are unintentional.

## 2018-02-27 ENCOUNTER — Encounter: Payer: Self-pay | Admitting: Obstetrics and Gynecology

## 2018-02-27 ENCOUNTER — Ambulatory Visit (INDEPENDENT_AMBULATORY_CARE_PROVIDER_SITE_OTHER): Payer: BLUE CROSS/BLUE SHIELD | Admitting: Obstetrics and Gynecology

## 2018-02-27 VITALS — BP 113/74 | HR 79 | Ht 64.0 in | Wt 159.1 lb

## 2018-02-27 DIAGNOSIS — N6452 Nipple discharge: Secondary | ICD-10-CM | POA: Diagnosis not present

## 2018-02-27 DIAGNOSIS — M7611 Psoas tendinitis, right hip: Secondary | ICD-10-CM | POA: Diagnosis not present

## 2018-02-27 DIAGNOSIS — N979 Female infertility, unspecified: Secondary | ICD-10-CM | POA: Diagnosis not present

## 2018-02-27 DIAGNOSIS — Z01419 Encounter for gynecological examination (general) (routine) without abnormal findings: Secondary | ICD-10-CM | POA: Diagnosis not present

## 2018-02-27 DIAGNOSIS — N809 Endometriosis, unspecified: Secondary | ICD-10-CM

## 2018-02-27 DIAGNOSIS — M9903 Segmental and somatic dysfunction of lumbar region: Secondary | ICD-10-CM | POA: Diagnosis not present

## 2018-02-27 DIAGNOSIS — M5386 Other specified dorsopathies, lumbar region: Secondary | ICD-10-CM | POA: Diagnosis not present

## 2018-02-27 DIAGNOSIS — M531 Cervicobrachial syndrome: Secondary | ICD-10-CM | POA: Diagnosis not present

## 2018-02-27 NOTE — Patient Instructions (Signed)
1.  Pap smear is done 2.  Self breast awareness is encouraged 3.  Screening labs are obtained through primary care.  Lichen level is ordered today due to bilateral nipple discharge 4.  Continue with healthy eating and exercise 5.  Continue with prenatal vitamin daily 6.  Return in 1 year for annual exam  Health Maintenance, Female Adopting a healthy lifestyle and getting preventive care can go a long way to promote health and wellness. Talk with your health care provider about what schedule of regular examinations is right for you. This is a good chance for you to check in with your provider about disease prevention and staying healthy. In between checkups, there are plenty of things you can do on your own. Experts have done a lot of research about which lifestyle changes and preventive measures are most likely to keep you healthy. Ask your health care provider for more information. Weight and diet Eat a healthy diet  Be sure to include plenty of vegetables, fruits, low-fat dairy products, and lean protein.  Do not eat a lot of foods high in solid fats, added sugars, or salt.  Get regular exercise. This is one of the most important things you can do for your health. ? Most adults should exercise for at least 150 minutes each week. The exercise should increase your heart rate and make you sweat (moderate-intensity exercise). ? Most adults should also do strengthening exercises at least twice a week. This is in addition to the moderate-intensity exercise.  Maintain a healthy weight  Body mass index (BMI) is a measurement that can be used to identify possible weight problems. It estimates body fat based on height and weight. Your health care provider can help determine your BMI and help you achieve or maintain a healthy weight.  For females 19 years of age and older: ? A BMI below 18.5 is considered underweight. ? A BMI of 18.5 to 24.9 is normal. ? A BMI of 25 to 29.9 is considered  overweight. ? A BMI of 30 and above is considered obese.  Watch levels of cholesterol and blood lipids  You should start having your blood tested for lipids and cholesterol at 35 years of age, then have this test every 5 years.  You may need to have your cholesterol levels checked more often if: ? Your lipid or cholesterol levels are high. ? You are older than 35 years of age. ? You are at high risk for heart disease.  Cancer screening Lung Cancer  Lung cancer screening is recommended for adults 55-26 years old who are at high risk for lung cancer because of a history of smoking.  A yearly low-dose CT scan of the lungs is recommended for people who: ? Currently smoke. ? Have quit within the past 15 years. ? Have at least a 30-pack-year history of smoking. A pack year is smoking an average of one pack of cigarettes a day for 1 year.  Yearly screening should continue until it has been 15 years since you quit.  Yearly screening should stop if you develop a health problem that would prevent you from having lung cancer treatment.  Breast Cancer  Practice breast self-awareness. This means understanding how your breasts normally appear and feel.  It also means doing regular breast self-exams. Let your health care provider know about any changes, no matter how small.  If you are in your 20s or 30s, you should have a clinical breast exam (CBE) by a health care  provider every 1-3 years as part of a regular health exam.  If you are 44 or older, have a CBE every year. Also consider having a breast X-ray (mammogram) every year.  If you have a family history of breast cancer, talk to your health care provider about genetic screening.  If you are at high risk for breast cancer, talk to your health care provider about having an MRI and a mammogram every year.  Breast cancer gene (BRCA) assessment is recommended for women who have family members with BRCA-related cancers. BRCA-related cancers  include: ? Breast. ? Ovarian. ? Tubal. ? Peritoneal cancers.  Results of the assessment will determine the need for genetic counseling and BRCA1 and BRCA2 testing.  Cervical Cancer Your health care provider may recommend that you be screened regularly for cancer of the pelvic organs (ovaries, uterus, and vagina). This screening involves a pelvic examination, including checking for microscopic changes to the surface of your cervix (Pap test). You may be encouraged to have this screening done every 3 years, beginning at age 3.  For women ages 27-65, health care providers may recommend pelvic exams and Pap testing every 3 years, or they may recommend the Pap and pelvic exam, combined with testing for human papilloma virus (HPV), every 5 years. Some types of HPV increase your risk of cervical cancer. Testing for HPV may also be done on women of any age with unclear Pap test results.  Other health care providers may not recommend any screening for nonpregnant women who are considered low risk for pelvic cancer and who do not have symptoms. Ask your health care provider if a screening pelvic exam is right for you.  If you have had past treatment for cervical cancer or a condition that could lead to cancer, you need Pap tests and screening for cancer for at least 20 years after your treatment. If Pap tests have been discontinued, your risk factors (such as having a new sexual partner) need to be reassessed to determine if screening should resume. Some women have medical problems that increase the chance of getting cervical cancer. In these cases, your health care provider may recommend more frequent screening and Pap tests.  Colorectal Cancer  This type of cancer can be detected and often prevented.  Routine colorectal cancer screening usually begins at 35 years of age and continues through 35 years of age.  Your health care provider may recommend screening at an earlier age if you have risk factors  for colon cancer.  Your health care provider may also recommend using home test kits to check for hidden blood in the stool.  A small camera at the end of a tube can be used to examine your colon directly (sigmoidoscopy or colonoscopy). This is done to check for the earliest forms of colorectal cancer.  Routine screening usually begins at age 27.  Direct examination of the colon should be repeated every 5-10 years through 36 years of age. However, you may need to be screened more often if early forms of precancerous polyps or small growths are found.  Skin Cancer  Check your skin from head to toe regularly.  Tell your health care provider about any new moles or changes in moles, especially if there is a change in a mole's shape or color.  Also tell your health care provider if you have a mole that is larger than the size of a pencil eraser.  Always use sunscreen. Apply sunscreen liberally and repeatedly throughout the day.  Protect yourself by wearing long sleeves, pants, a wide-brimmed hat, and sunglasses whenever you are outside.  Heart disease, diabetes, and high blood pressure  High blood pressure causes heart disease and increases the risk of stroke. High blood pressure is more likely to develop in: ? People who have blood pressure in the high end of the normal range (130-139/85-89 mm Hg). ? People who are overweight or obese. ? People who are African American.  If you are 57-39 years of age, have your blood pressure checked every 3-5 years. If you are 19 years of age or older, have your blood pressure checked every year. You should have your blood pressure measured twice-once when you are at a hospital or clinic, and once when you are not at a hospital or clinic. Record the average of the two measurements. To check your blood pressure when you are not at a hospital or clinic, you can use: ? An automated blood pressure machine at a pharmacy. ? A home blood pressure monitor.  If  you are between 19 years and 38 years old, ask your health care provider if you should take aspirin to prevent strokes.  Have regular diabetes screenings. This involves taking a blood sample to check your fasting blood sugar level. ? If you are at a normal weight and have a low risk for diabetes, have this test once every three years after 35 years of age. ? If you are overweight and have a high risk for diabetes, consider being tested at a younger age or more often. Preventing infection Hepatitis B  If you have a higher risk for hepatitis B, you should be screened for this virus. You are considered at high risk for hepatitis B if: ? You were born in a country where hepatitis B is common. Ask your health care provider which countries are considered high risk. ? Your parents were born in a high-risk country, and you have not been immunized against hepatitis B (hepatitis B vaccine). ? You have HIV or AIDS. ? You use needles to inject street drugs. ? You live with someone who has hepatitis B. ? You have had sex with someone who has hepatitis B. ? You get hemodialysis treatment. ? You take certain medicines for conditions, including cancer, organ transplantation, and autoimmune conditions.  Hepatitis C  Blood testing is recommended for: ? Everyone born from [birth year] through 1963. ? Anyone with known risk factors for hepatitis C.  Sexually transmitted infections (STIs)  You should be screened for sexually transmitted infections (STIs) including gonorrhea and chlamydia if: ? You are sexually active and are younger than 35 years of age. ? You are older than 35 years of age and your health care provider tells you that you are at risk for this type of infection. ? Your sexual activity has changed since you were last screened and you are at an increased risk for chlamydia or gonorrhea. Ask your health care provider if you are at risk.  If you do not have HIV, but are at risk, it may be recommended  that you take a prescription medicine daily to prevent HIV infection. This is called pre-exposure prophylaxis (PrEP). You are considered at risk if: ? You are sexually active and do not regularly use condoms or know the HIV status of your partner(s). ? You take drugs by injection. ? You are sexually active with a partner who has HIV.  Talk with your health care provider about whether you are at high risk of  being infected with HIV. If you choose to begin PrEP, you should first be tested for HIV. You should then be tested every 3 months for as long as you are taking PrEP. Pregnancy  If you are premenopausal and you may become pregnant, ask your health care provider about preconception counseling.  If you may become pregnant, take 400 to 800 micrograms (mcg) of folic acid every day.  If you want to prevent pregnancy, talk to your health care provider about birth control (contraception). Osteoporosis and menopause  Osteoporosis is a disease in which the bones lose minerals and strength with aging. This can result in serious bone fractures. Your risk for osteoporosis can be identified using a bone density scan.  If you are 41 years of age or older, or if you are at risk for osteoporosis and fractures, ask your health care provider if you should be screened.  Ask your health care provider whether you should take a calcium or vitamin D supplement to lower your risk for osteoporosis.  Menopause may have certain physical symptoms and risks.  Hormone replacement therapy may reduce some of these symptoms and risks. Talk to your health care provider about whether hormone replacement therapy is right for you. Follow these instructions at home:  Schedule regular health, dental, and eye exams.  Stay current with your immunizations.  Do not use any tobacco products including cigarettes, chewing tobacco, or electronic cigarettes.  If you are pregnant, do not drink alcohol.  If you are  breastfeeding, limit how much and how often you drink alcohol.  Limit alcohol intake to no more than 1 drink per day for nonpregnant women. One drink equals 12 ounces of beer, 5 ounces of wine, or 1 ounces of hard liquor.  Do not use street drugs.  Do not share needles.  Ask your health care provider for help if you need support or information about quitting drugs.  Tell your health care provider if you often feel depressed.  Tell your health care provider if you have ever been abused or do not feel safe at home. This information is not intended to replace advice given to you by your health care provider. Make sure you discuss any questions you have with your health care provider. Document Released: 05/23/2011 Document Revised: 04/14/2016 Document Reviewed: 08/11/2015 Elsevier Interactive Patient Education  [employer].

## 2018-02-28 LAB — PAP IG W/ RFLX HPV ASCU: PAP SMEAR COMMENT: 0

## 2018-02-28 LAB — PROLACTIN: PROLACTIN: 14.9 ng/mL (ref 4.8–23.3)

## 2018-03-02 DIAGNOSIS — D2261 Melanocytic nevi of right upper limb, including shoulder: Secondary | ICD-10-CM | POA: Diagnosis not present

## 2018-03-02 DIAGNOSIS — Z85828 Personal history of other malignant neoplasm of skin: Secondary | ICD-10-CM | POA: Diagnosis not present

## 2018-03-02 DIAGNOSIS — D2272 Melanocytic nevi of left lower limb, including hip: Secondary | ICD-10-CM | POA: Diagnosis not present

## 2018-03-02 DIAGNOSIS — D2262 Melanocytic nevi of left upper limb, including shoulder: Secondary | ICD-10-CM | POA: Diagnosis not present

## 2018-03-05 ENCOUNTER — Telehealth: Payer: Self-pay | Admitting: *Deleted

## 2018-03-05 NOTE — Telephone Encounter (Signed)
Patient called and states that she faxed over a form ( health maintenance form  ). This form needs to state when she got her last Physical done and Dr. Tennis Must needs to sign it. Patient states she faxed the form over on 02/28/18.  Patient states she has not received the form back . She is calling to inquired about it Patient is requesting a call back. Her contact # [phone]. Please advise. Thank you

## 2018-03-06 NOTE — Telephone Encounter (Signed)
I believe this form is on your desk. Will you pls sign and give to me. ty   Pt aware I will fax today.

## 2018-03-13 ENCOUNTER — Encounter: Payer: BLUE CROSS/BLUE SHIELD | Admitting: Obstetrics and Gynecology

## 2018-03-20 DIAGNOSIS — M722 Plantar fascial fibromatosis: Secondary | ICD-10-CM | POA: Diagnosis not present

## 2018-03-20 DIAGNOSIS — M5387 Other specified dorsopathies, lumbosacral region: Secondary | ICD-10-CM | POA: Diagnosis not present

## 2018-03-20 DIAGNOSIS — M9903 Segmental and somatic dysfunction of lumbar region: Secondary | ICD-10-CM | POA: Diagnosis not present

## 2018-04-03 DIAGNOSIS — M9903 Segmental and somatic dysfunction of lumbar region: Secondary | ICD-10-CM | POA: Diagnosis not present

## 2018-04-03 DIAGNOSIS — M5387 Other specified dorsopathies, lumbosacral region: Secondary | ICD-10-CM | POA: Diagnosis not present

## 2018-04-03 DIAGNOSIS — M9901 Segmental and somatic dysfunction of cervical region: Secondary | ICD-10-CM | POA: Diagnosis not present

## 2018-04-03 DIAGNOSIS — M531 Cervicobrachial syndrome: Secondary | ICD-10-CM | POA: Diagnosis not present

## 2018-04-06 DIAGNOSIS — M25612 Stiffness of left shoulder, not elsewhere classified: Secondary | ICD-10-CM | POA: Diagnosis not present

## 2018-04-06 DIAGNOSIS — M531 Cervicobrachial syndrome: Secondary | ICD-10-CM | POA: Diagnosis not present

## 2018-04-06 DIAGNOSIS — M65812 Other synovitis and tenosynovitis, left shoulder: Secondary | ICD-10-CM | POA: Diagnosis not present

## 2018-04-06 DIAGNOSIS — M9901 Segmental and somatic dysfunction of cervical region: Secondary | ICD-10-CM | POA: Diagnosis not present

## 2018-05-01 DIAGNOSIS — M9901 Segmental and somatic dysfunction of cervical region: Secondary | ICD-10-CM | POA: Diagnosis not present

## 2018-05-01 DIAGNOSIS — M722 Plantar fascial fibromatosis: Secondary | ICD-10-CM | POA: Diagnosis not present

## 2018-05-01 DIAGNOSIS — M531 Cervicobrachial syndrome: Secondary | ICD-10-CM | POA: Diagnosis not present

## 2018-05-01 DIAGNOSIS — M5387 Other specified dorsopathies, lumbosacral region: Secondary | ICD-10-CM | POA: Diagnosis not present

## 2018-05-08 DIAGNOSIS — M7611 Psoas tendinitis, right hip: Secondary | ICD-10-CM | POA: Diagnosis not present

## 2018-05-08 DIAGNOSIS — M25552 Pain in left hip: Secondary | ICD-10-CM | POA: Diagnosis not present

## 2018-05-08 DIAGNOSIS — M9903 Segmental and somatic dysfunction of lumbar region: Secondary | ICD-10-CM | POA: Diagnosis not present

## 2018-05-08 DIAGNOSIS — M5387 Other specified dorsopathies, lumbosacral region: Secondary | ICD-10-CM | POA: Diagnosis not present

## 2018-05-22 DIAGNOSIS — M26601 Right temporomandibular joint disorder, unspecified: Secondary | ICD-10-CM | POA: Diagnosis not present

## 2018-05-22 DIAGNOSIS — M722 Plantar fascial fibromatosis: Secondary | ICD-10-CM | POA: Diagnosis not present

## 2018-05-22 DIAGNOSIS — M9903 Segmental and somatic dysfunction of lumbar region: Secondary | ICD-10-CM | POA: Diagnosis not present

## 2018-05-22 DIAGNOSIS — M531 Cervicobrachial syndrome: Secondary | ICD-10-CM | POA: Diagnosis not present

## 2018-06-15 DIAGNOSIS — M5387 Other specified dorsopathies, lumbosacral region: Secondary | ICD-10-CM | POA: Diagnosis not present

## 2018-06-15 DIAGNOSIS — M9903 Segmental and somatic dysfunction of lumbar region: Secondary | ICD-10-CM | POA: Diagnosis not present

## 2018-06-19 DIAGNOSIS — M9903 Segmental and somatic dysfunction of lumbar region: Secondary | ICD-10-CM | POA: Diagnosis not present

## 2018-06-19 DIAGNOSIS — S39012A Strain of muscle, fascia and tendon of lower back, initial encounter: Secondary | ICD-10-CM | POA: Diagnosis not present

## 2018-06-19 DIAGNOSIS — M5386 Other specified dorsopathies, lumbar region: Secondary | ICD-10-CM | POA: Diagnosis not present

## 2018-06-19 DIAGNOSIS — M5387 Other specified dorsopathies, lumbosacral region: Secondary | ICD-10-CM | POA: Diagnosis not present

## 2018-07-03 DIAGNOSIS — M9902 Segmental and somatic dysfunction of thoracic region: Secondary | ICD-10-CM | POA: Diagnosis not present

## 2018-07-03 DIAGNOSIS — M722 Plantar fascial fibromatosis: Secondary | ICD-10-CM | POA: Diagnosis not present

## 2018-07-03 DIAGNOSIS — M5386 Other specified dorsopathies, lumbar region: Secondary | ICD-10-CM | POA: Diagnosis not present

## 2018-07-03 DIAGNOSIS — M9903 Segmental and somatic dysfunction of lumbar region: Secondary | ICD-10-CM | POA: Diagnosis not present

## 2018-07-17 DIAGNOSIS — M9902 Segmental and somatic dysfunction of thoracic region: Secondary | ICD-10-CM | POA: Diagnosis not present

## 2018-07-17 DIAGNOSIS — M5386 Other specified dorsopathies, lumbar region: Secondary | ICD-10-CM | POA: Diagnosis not present

## 2018-07-17 DIAGNOSIS — M9903 Segmental and somatic dysfunction of lumbar region: Secondary | ICD-10-CM | POA: Diagnosis not present

## 2018-07-17 DIAGNOSIS — M722 Plantar fascial fibromatosis: Secondary | ICD-10-CM | POA: Diagnosis not present

## 2018-07-30 DIAGNOSIS — H5203 Hypermetropia, bilateral: Secondary | ICD-10-CM | POA: Diagnosis not present

## 2018-07-31 DIAGNOSIS — M9903 Segmental and somatic dysfunction of lumbar region: Secondary | ICD-10-CM | POA: Diagnosis not present

## 2018-07-31 DIAGNOSIS — M5386 Other specified dorsopathies, lumbar region: Secondary | ICD-10-CM | POA: Diagnosis not present

## 2018-08-03 DIAGNOSIS — M9903 Segmental and somatic dysfunction of lumbar region: Secondary | ICD-10-CM | POA: Diagnosis not present

## 2018-08-03 DIAGNOSIS — M5386 Other specified dorsopathies, lumbar region: Secondary | ICD-10-CM | POA: Diagnosis not present

## 2018-08-06 DIAGNOSIS — M5386 Other specified dorsopathies, lumbar region: Secondary | ICD-10-CM | POA: Diagnosis not present

## 2018-08-06 DIAGNOSIS — M9903 Segmental and somatic dysfunction of lumbar region: Secondary | ICD-10-CM | POA: Diagnosis not present

## 2018-08-07 DIAGNOSIS — M9903 Segmental and somatic dysfunction of lumbar region: Secondary | ICD-10-CM | POA: Diagnosis not present

## 2018-08-07 DIAGNOSIS — M5386 Other specified dorsopathies, lumbar region: Secondary | ICD-10-CM | POA: Diagnosis not present

## 2018-08-14 DIAGNOSIS — M9903 Segmental and somatic dysfunction of lumbar region: Secondary | ICD-10-CM | POA: Diagnosis not present

## 2018-08-14 DIAGNOSIS — M5386 Other specified dorsopathies, lumbar region: Secondary | ICD-10-CM | POA: Diagnosis not present

## 2018-08-14 DIAGNOSIS — M722 Plantar fascial fibromatosis: Secondary | ICD-10-CM | POA: Diagnosis not present

## 2018-08-14 DIAGNOSIS — M7611 Psoas tendinitis, right hip: Secondary | ICD-10-CM | POA: Diagnosis not present

## 2018-08-20 DIAGNOSIS — M9903 Segmental and somatic dysfunction of lumbar region: Secondary | ICD-10-CM | POA: Diagnosis not present

## 2018-08-20 DIAGNOSIS — M5386 Other specified dorsopathies, lumbar region: Secondary | ICD-10-CM | POA: Diagnosis not present

## 2018-09-04 DIAGNOSIS — M5387 Other specified dorsopathies, lumbosacral region: Secondary | ICD-10-CM | POA: Diagnosis not present

## 2018-09-04 DIAGNOSIS — M9904 Segmental and somatic dysfunction of sacral region: Secondary | ICD-10-CM | POA: Diagnosis not present

## 2018-09-04 DIAGNOSIS — M9903 Segmental and somatic dysfunction of lumbar region: Secondary | ICD-10-CM | POA: Diagnosis not present

## 2018-09-06 DIAGNOSIS — D225 Melanocytic nevi of trunk: Secondary | ICD-10-CM | POA: Diagnosis not present

## 2018-09-06 DIAGNOSIS — D2261 Melanocytic nevi of right upper limb, including shoulder: Secondary | ICD-10-CM | POA: Diagnosis not present

## 2018-09-06 DIAGNOSIS — Z85828 Personal history of other malignant neoplasm of skin: Secondary | ICD-10-CM | POA: Diagnosis not present

## 2018-09-06 DIAGNOSIS — D2262 Melanocytic nevi of left upper limb, including shoulder: Secondary | ICD-10-CM | POA: Diagnosis not present

## 2018-09-18 DIAGNOSIS — M5386 Other specified dorsopathies, lumbar region: Secondary | ICD-10-CM | POA: Diagnosis not present

## 2018-09-18 DIAGNOSIS — M531 Cervicobrachial syndrome: Secondary | ICD-10-CM | POA: Diagnosis not present

## 2018-10-02 DIAGNOSIS — M531 Cervicobrachial syndrome: Secondary | ICD-10-CM | POA: Diagnosis not present

## 2018-10-02 DIAGNOSIS — M9902 Segmental and somatic dysfunction of thoracic region: Secondary | ICD-10-CM | POA: Diagnosis not present

## 2018-10-02 DIAGNOSIS — M5386 Other specified dorsopathies, lumbar region: Secondary | ICD-10-CM | POA: Diagnosis not present

## 2018-10-16 DIAGNOSIS — M531 Cervicobrachial syndrome: Secondary | ICD-10-CM | POA: Diagnosis not present

## 2018-10-16 DIAGNOSIS — M9902 Segmental and somatic dysfunction of thoracic region: Secondary | ICD-10-CM | POA: Diagnosis not present

## 2018-10-16 DIAGNOSIS — M5387 Other specified dorsopathies, lumbosacral region: Secondary | ICD-10-CM | POA: Diagnosis not present

## 2018-11-06 DIAGNOSIS — M9902 Segmental and somatic dysfunction of thoracic region: Secondary | ICD-10-CM | POA: Diagnosis not present

## 2018-11-06 DIAGNOSIS — M5387 Other specified dorsopathies, lumbosacral region: Secondary | ICD-10-CM | POA: Diagnosis not present

## 2018-11-06 DIAGNOSIS — M531 Cervicobrachial syndrome: Secondary | ICD-10-CM | POA: Diagnosis not present

## 2018-11-27 DIAGNOSIS — M9902 Segmental and somatic dysfunction of thoracic region: Secondary | ICD-10-CM | POA: Diagnosis not present

## 2018-11-27 DIAGNOSIS — H18822 Corneal disorder due to contact lens, left eye: Secondary | ICD-10-CM | POA: Diagnosis not present

## 2018-11-27 DIAGNOSIS — M9903 Segmental and somatic dysfunction of lumbar region: Secondary | ICD-10-CM | POA: Diagnosis not present

## 2018-11-27 DIAGNOSIS — M9901 Segmental and somatic dysfunction of cervical region: Secondary | ICD-10-CM | POA: Diagnosis not present

## 2018-12-18 DIAGNOSIS — M9902 Segmental and somatic dysfunction of thoracic region: Secondary | ICD-10-CM | POA: Diagnosis not present

## 2018-12-18 DIAGNOSIS — M9903 Segmental and somatic dysfunction of lumbar region: Secondary | ICD-10-CM | POA: Diagnosis not present

## 2018-12-18 DIAGNOSIS — M9901 Segmental and somatic dysfunction of cervical region: Secondary | ICD-10-CM | POA: Diagnosis not present

## 2018-12-18 DIAGNOSIS — M722 Plantar fascial fibromatosis: Secondary | ICD-10-CM | POA: Diagnosis not present

## 2019-01-08 DIAGNOSIS — M26602 Left temporomandibular joint disorder, unspecified: Secondary | ICD-10-CM | POA: Diagnosis not present

## 2019-01-08 DIAGNOSIS — M9902 Segmental and somatic dysfunction of thoracic region: Secondary | ICD-10-CM | POA: Diagnosis not present

## 2019-01-08 DIAGNOSIS — M9901 Segmental and somatic dysfunction of cervical region: Secondary | ICD-10-CM | POA: Diagnosis not present

## 2019-01-08 DIAGNOSIS — M531 Cervicobrachial syndrome: Secondary | ICD-10-CM | POA: Diagnosis not present

## 2019-01-29 DIAGNOSIS — M9902 Segmental and somatic dysfunction of thoracic region: Secondary | ICD-10-CM | POA: Diagnosis not present

## 2019-01-29 DIAGNOSIS — M531 Cervicobrachial syndrome: Secondary | ICD-10-CM | POA: Diagnosis not present

## 2019-01-29 DIAGNOSIS — M9901 Segmental and somatic dysfunction of cervical region: Secondary | ICD-10-CM | POA: Diagnosis not present

## 2019-01-29 DIAGNOSIS — M5387 Other specified dorsopathies, lumbosacral region: Secondary | ICD-10-CM | POA: Diagnosis not present

## 2019-02-05 DIAGNOSIS — M9903 Segmental and somatic dysfunction of lumbar region: Secondary | ICD-10-CM | POA: Diagnosis not present

## 2019-02-05 DIAGNOSIS — M25551 Pain in right hip: Secondary | ICD-10-CM | POA: Diagnosis not present

## 2019-02-05 DIAGNOSIS — M5386 Other specified dorsopathies, lumbar region: Secondary | ICD-10-CM | POA: Diagnosis not present

## 2019-02-05 DIAGNOSIS — M7611 Psoas tendinitis, right hip: Secondary | ICD-10-CM | POA: Diagnosis not present

## 2019-02-19 DIAGNOSIS — G5721 Lesion of femoral nerve, right lower limb: Secondary | ICD-10-CM | POA: Diagnosis not present

## 2019-02-19 DIAGNOSIS — M5386 Other specified dorsopathies, lumbar region: Secondary | ICD-10-CM | POA: Diagnosis not present

## 2019-02-19 DIAGNOSIS — S4352XA Sprain of left acromioclavicular joint, initial encounter: Secondary | ICD-10-CM | POA: Diagnosis not present

## 2019-02-19 DIAGNOSIS — M722 Plantar fascial fibromatosis: Secondary | ICD-10-CM | POA: Diagnosis not present

## 2019-02-22 DIAGNOSIS — L570 Actinic keratosis: Secondary | ICD-10-CM | POA: Diagnosis not present

## 2019-02-22 DIAGNOSIS — B078 Other viral warts: Secondary | ICD-10-CM | POA: Diagnosis not present

## 2019-02-26 DIAGNOSIS — M531 Cervicobrachial syndrome: Secondary | ICD-10-CM | POA: Diagnosis not present

## 2019-02-26 DIAGNOSIS — M9901 Segmental and somatic dysfunction of cervical region: Secondary | ICD-10-CM | POA: Diagnosis not present

## 2019-02-26 DIAGNOSIS — M5387 Other specified dorsopathies, lumbosacral region: Secondary | ICD-10-CM | POA: Diagnosis not present

## 2019-02-26 DIAGNOSIS — S4352XA Sprain of left acromioclavicular joint, initial encounter: Secondary | ICD-10-CM | POA: Diagnosis not present

## 2019-02-28 ENCOUNTER — Encounter: Payer: BLUE CROSS/BLUE SHIELD | Admitting: Obstetrics and Gynecology

## 2019-03-05 DIAGNOSIS — M9903 Segmental and somatic dysfunction of lumbar region: Secondary | ICD-10-CM | POA: Diagnosis not present

## 2019-03-05 DIAGNOSIS — M5387 Other specified dorsopathies, lumbosacral region: Secondary | ICD-10-CM | POA: Diagnosis not present

## 2019-03-08 DIAGNOSIS — M5387 Other specified dorsopathies, lumbosacral region: Secondary | ICD-10-CM | POA: Diagnosis not present

## 2019-03-08 DIAGNOSIS — M531 Cervicobrachial syndrome: Secondary | ICD-10-CM | POA: Diagnosis not present

## 2019-03-08 DIAGNOSIS — M5386 Other specified dorsopathies, lumbar region: Secondary | ICD-10-CM | POA: Diagnosis not present

## 2019-03-08 DIAGNOSIS — M9903 Segmental and somatic dysfunction of lumbar region: Secondary | ICD-10-CM | POA: Diagnosis not present

## 2019-03-12 DIAGNOSIS — M5386 Other specified dorsopathies, lumbar region: Secondary | ICD-10-CM | POA: Diagnosis not present

## 2019-03-12 DIAGNOSIS — M9901 Segmental and somatic dysfunction of cervical region: Secondary | ICD-10-CM | POA: Diagnosis not present

## 2019-03-12 DIAGNOSIS — M531 Cervicobrachial syndrome: Secondary | ICD-10-CM | POA: Diagnosis not present

## 2019-03-12 DIAGNOSIS — M7542 Impingement syndrome of left shoulder: Secondary | ICD-10-CM | POA: Diagnosis not present

## 2019-03-19 DIAGNOSIS — M5386 Other specified dorsopathies, lumbar region: Secondary | ICD-10-CM | POA: Diagnosis not present

## 2019-04-02 DIAGNOSIS — M9903 Segmental and somatic dysfunction of lumbar region: Secondary | ICD-10-CM | POA: Diagnosis not present

## 2019-04-02 DIAGNOSIS — M531 Cervicobrachial syndrome: Secondary | ICD-10-CM | POA: Diagnosis not present

## 2019-04-02 DIAGNOSIS — M9901 Segmental and somatic dysfunction of cervical region: Secondary | ICD-10-CM | POA: Diagnosis not present

## 2019-04-02 DIAGNOSIS — M5387 Other specified dorsopathies, lumbosacral region: Secondary | ICD-10-CM | POA: Diagnosis not present

## 2019-04-10 DIAGNOSIS — M9903 Segmental and somatic dysfunction of lumbar region: Secondary | ICD-10-CM | POA: Diagnosis not present

## 2019-04-10 DIAGNOSIS — M9901 Segmental and somatic dysfunction of cervical region: Secondary | ICD-10-CM | POA: Diagnosis not present

## 2019-04-10 DIAGNOSIS — M5387 Other specified dorsopathies, lumbosacral region: Secondary | ICD-10-CM | POA: Diagnosis not present

## 2019-04-10 DIAGNOSIS — M531 Cervicobrachial syndrome: Secondary | ICD-10-CM | POA: Diagnosis not present

## 2019-04-19 DIAGNOSIS — M5387 Other specified dorsopathies, lumbosacral region: Secondary | ICD-10-CM | POA: Diagnosis not present

## 2019-04-19 DIAGNOSIS — M9903 Segmental and somatic dysfunction of lumbar region: Secondary | ICD-10-CM | POA: Diagnosis not present

## 2019-04-19 DIAGNOSIS — M9902 Segmental and somatic dysfunction of thoracic region: Secondary | ICD-10-CM | POA: Diagnosis not present

## 2019-04-19 DIAGNOSIS — M531 Cervicobrachial syndrome: Secondary | ICD-10-CM | POA: Diagnosis not present

## 2019-05-03 DIAGNOSIS — M5441 Lumbago with sciatica, right side: Secondary | ICD-10-CM | POA: Diagnosis not present

## 2019-05-03 DIAGNOSIS — M722 Plantar fascial fibromatosis: Secondary | ICD-10-CM | POA: Diagnosis not present

## 2019-05-03 DIAGNOSIS — M9903 Segmental and somatic dysfunction of lumbar region: Secondary | ICD-10-CM | POA: Diagnosis not present

## 2019-05-10 DIAGNOSIS — M9902 Segmental and somatic dysfunction of thoracic region: Secondary | ICD-10-CM | POA: Diagnosis not present

## 2019-05-10 DIAGNOSIS — M5387 Other specified dorsopathies, lumbosacral region: Secondary | ICD-10-CM | POA: Diagnosis not present

## 2019-05-10 DIAGNOSIS — M531 Cervicobrachial syndrome: Secondary | ICD-10-CM | POA: Diagnosis not present

## 2019-05-14 ENCOUNTER — Telehealth: Payer: Self-pay

## 2019-05-14 NOTE — Telephone Encounter (Signed)
Pt prescreened no symptoms. Has face mask.   Coronavirus (COVID-19) Are you at risk?  Are you at risk for the Coronavirus (COVID-19)?  To be considered HIGH RISK for Coronavirus (COVID-19), you have to meet the following criteria:  . Traveled to [place], [place], [place], [place] or [place]; or in the [place] to [place], [place], [place], or [place]; and have fever, cough, and shortness of breath within the last 2 weeks of travel OR . Been in close contact with a person diagnosed with COVID-19 within the last 2 weeks and have fever, cough, and shortness of breath . IF YOU DO NOT MEET THESE CRITERIA, YOU ARE CONSIDERED LOW RISK FOR COVID-19.  What to do if you are HIGH RISK for COVID-19?  Marland Kitchen If you are having a medical emergency, call 911. . Seek medical care right away. Before you go to a doctor's office, urgent care or emergency department, call ahead and tell them about your recent travel, contact with someone diagnosed with COVID-19, and your symptoms. You should receive instructions from your physician's office regarding next steps of care.  . When you arrive at healthcare provider, tell the healthcare staff immediately you have returned from visiting [place], [place], [place], [place] or [place]; or traveled in the [place] to [place], [place], [place], or [place]; in the last two weeks or you have been in close contact with a person diagnosed with COVID-19 in the last 2 weeks.   . Tell the health care staff about your symptoms: fever, cough and shortness of breath. . After you have been seen by a medical provider, you will be either: o Tested for (COVID-19) and discharged home on quarantine except to seek medical care if symptoms worsen, and asked to  - Stay home and avoid contact with others until you get your results (4-5 days)  - Avoid travel on public transportation if possible (such as bus, train, or airplane) or o Sent to the Emergency Department by EMS for  evaluation, COVID-19 testing, and possible admission depending on your condition and test results.  What to do if you are LOW RISK for COVID-19?  Reduce your risk of any infection by using the same precautions used for avoiding the common cold or flu:  Marland Kitchen Wash your hands often with soap and warm water for at least 20 seconds.  If soap and water are not readily available, use an alcohol-based hand sanitizer with at least 60% alcohol.  . If coughing or sneezing, cover your mouth and nose by coughing or sneezing into the elbow areas of your shirt or coat, into a tissue or into your sleeve (not your hands). . Avoid shaking hands with others and consider head nods or verbal greetings only. . Avoid touching your eyes, nose, or mouth with unwashed hands.  . Avoid close contact with people who are sick. . Avoid places or events with large numbers of people in one location, like concerts or sporting events. . Carefully consider travel plans you have or are making. . If you are planning any travel outside or inside the [place], visit the CDC's Travelers' Health webpage for the latest health notices. . If you have some symptoms but not all symptoms, continue to monitor at home and seek medical attention if your symptoms worsen. . If you are having a medical emergency, call 911.   ADDITIONAL HEALTHCARE OPTIONS FOR PATIENTS  [facility] / e-Visit: [email or web address]  [facility]: [phone]  [facility]: [phone]                   [facility]: [phone]  

## 2019-05-15 ENCOUNTER — Other Ambulatory Visit: Payer: Self-pay

## 2019-05-15 ENCOUNTER — Other Ambulatory Visit (HOSPITAL_COMMUNITY)
Admission: RE | Admit: 2019-05-15 | Discharge: 2019-05-15 | Disposition: A | Discharge status: Home / Self Care | Payer: BC Managed Care – PPO | Source: Ambulatory Visit | Attending: Obstetrics and Gynecology | Admitting: Obstetrics and Gynecology

## 2019-05-15 ENCOUNTER — Ambulatory Visit (INDEPENDENT_AMBULATORY_CARE_PROVIDER_SITE_OTHER): Payer: BC Managed Care – PPO | Admitting: Obstetrics and Gynecology

## 2019-05-15 ENCOUNTER — Encounter: Payer: Self-pay | Admitting: Obstetrics and Gynecology

## 2019-05-15 VITALS — BP 111/73 | HR 87 | Ht 64.0 in | Wt 146.1 lb

## 2019-05-15 DIAGNOSIS — N809 Endometriosis, unspecified: Secondary | ICD-10-CM

## 2019-05-15 DIAGNOSIS — Z124 Encounter for screening for malignant neoplasm of cervix: Secondary | ICD-10-CM | POA: Diagnosis not present

## 2019-05-15 DIAGNOSIS — Z01419 Encounter for gynecological examination (general) (routine) without abnormal findings: Secondary | ICD-10-CM | POA: Diagnosis not present

## 2019-05-15 DIAGNOSIS — N979 Female infertility, unspecified: Secondary | ICD-10-CM

## 2019-05-15 NOTE — Progress Notes (Signed)
Pt is present today for annual exam. Pt stated that she is doing well. Pt does monthly breast exam.

## 2019-05-15 NOTE — Patient Instructions (Signed)

## 2019-05-15 NOTE — Progress Notes (Signed)
GYNECOLOGY ANNUAL PHYSICAL EXAM PROGRESS NOTE  Subjective:    Diana Brooks is a 38 y.o. G81P1001 female who  presents for an annual exam . Patient has a PMH of endometriosis. The patient has no complaints today. The patient is sexually active.  The patient wears seatbelts: yes. The patient participates in regular exercise: yes. Has the patient ever been transfused or tattooed?: yes. The patient reports that there is not domestic violence in her life.    Gynecologic History Menarche age: 18 Patient's last menstrual period was 05/07/2019.  Regular cycles (25 days), last 5-6 days, moderate to heavy flow. Reports mild dysmenorrhea.  Contraception: none. Has a history of infertility.  History of STI's: Denies Last Pap: 490/2019. Results were: normal.  Denies h/o abnormal pap smears.   OB History  Gravida Para Term Preterm AB Living  1 1 1  0 0 1  SAB TAB Ectopic Multiple Live Births  0 0 0 0 1    # Outcome Date GA Lbr Len/2nd Weight Sex Delivery Anes PTL Lv  1 Term 2013   8 lb 1.6 oz (3.674 kg) M CS-LTranv   LIV    Past Medical History:  Diagnosis Date  . Anemia   . Anxiety   . Asthma    exercise induced  . Complication of anesthesia   . Dysmenorrhea   . Endometriosis   . GDM (gestational diabetes mellitus)   . Hyperprolactinemia ([place])   . IBS (irritable bowel syndrome)   . Infertility, female   . PONV (postoperative nausea and vomiting)    states phenergan helps alot post op n/v    Past Surgical History:  Procedure Laterality Date  . CESAREAN SECTION    . CHROMOPERTUBATION N/A 01/11/2016   Procedure: CHROMOPERTUBATION OF FALLOPIAN TUBES;  Surgeon: Brayton Mars, MD;  Location: [facility];  Service: Gynecology;  Laterality: N/A;  . LAPAROSCOPY     lap with excision of fulguration of endometriosis  . LAPAROSCOPY N/A 01/11/2016   Procedure: LAPAROSCOPY DIAGNOSTIC/ WITH EXCISION AND FULGERATION OF ENDOMETRIOSIS; MYOMECTOMY;  Surgeon: Brayton Mars, MD;   Location: [facility];  Service: Gynecology;  Laterality: N/A;  . wrist srugery     ganglion cyst removed    Family History  Problem Relation Age of Onset  . Endometriosis Mother   . Diabetes Paternal Grandfather   . Prostate cancer Maternal Uncle   . Heart disease Neg Hx   . Breast cancer Neg Hx   . Ovarian cancer Neg Hx   . Colon cancer Neg Hx     Social History   Socioeconomic History  . Marital status: Married    Spouse name: Not on file  . Number of children: Not on file  . Years of education: Not on file  . Highest education level: Not on file  Occupational History  . Not on file  Social Needs  . Financial resource strain: Not on file  . Food insecurity    Worry: Not on file    Inability: Not on file  . Transportation needs    Medical: Not on file    Non-medical: Not on file  Tobacco Use  . Smoking status: Never Smoker  . Smokeless tobacco: Never Used  Substance and Sexual Activity  . Alcohol use: Yes    Comment: rare  . Drug use: No  . Sexual activity: Yes    Birth control/protection: None  Lifestyle  . Physical activity    Days per week: 1 day  Minutes per session: 30 min  . Stress: Not on file  Relationships  . Social Herbalist on phone: Not on file    Gets together: Not on file    Attends religious service: Not on file    Active member of club or organization: Not on file    Attends meetings of clubs or organizations: Not on file    Relationship status: Not on file  . Intimate partner violence    Fear of current or ex partner: Not on file    Emotionally abused: Not on file    Physically abused: Not on file    Forced sexual activity: Not on file  Other Topics Concern  . Not on file  Social History Narrative  . Not on file    Current Outpatient Medications on File Prior to Visit  Medication Sig Dispense Refill  . acetaminophen (TYLENOL) 500 MG tablet Take 500 mg by mouth every 6 (six) hours as needed.    Marland Kitchen albuterol (PROVENTIL  HFA;VENTOLIN HFA) 108 (90 BASE) MCG/ACT inhaler Inhale into the lungs every 6 (six) hours as needed for wheezing or shortness of breath.    . Cholecalciferol (VITAMIN D HIGH POTENCY PO) Take 5,000 Units by mouth daily.    Marland Kitchen ibuprofen (ADVIL,MOTRIN) 800 MG tablet Take 1 tablet (800 mg total) by mouth 3 (three) times daily. 30 tablet 1  . Multiple Vitamin (MULTIVITAMIN) tablet Take 1 tablet by mouth daily.     No current facility-administered medications on file prior to visit.     Allergies  Allergen Reactions  . Codeine Nausea And Vomiting  . Levaquin [Levofloxacin In D5w]   . Percocet [Oxycodone-Acetaminophen] Nausea And Vomiting  . Sulfa Antibiotics   . Suprax [Cefixime]   . Vicodin [Hydrocodone-Acetaminophen] Nausea And Vomiting      Review of Systems Constitutional: negative for chills, fatigue, fevers and sweats Eyes: negative for irritation, redness and visual disturbance Ears, nose, mouth, throat, and face: negative for hearing loss, nasal congestion, snoring and tinnitus Respiratory: negative for asthma, cough, sputum Cardiovascular: negative for chest pain, dyspnea, exertional chest pressure/discomfort, irregular heart beat, palpitations and syncope Gastrointestinal: negative for abdominal pain, change in bowel habits, nausea and vomiting Genitourinary: negative for abnormal menstrual periods, genital lesions, sexual problems and vaginal discharge, dysuria and urinary incontinence Integument/breast: negative for breast lump, breast tenderness and nipple discharge Hematologic/lymphatic: negative for bleeding and easy bruising Musculoskeletal:negative for back pain and muscle weakness Neurological: negative for dizziness, headaches, vertigo and weakness Endocrine: negative for diabetic symptoms including polydipsia, polyuria and skin dryness Allergic/Immunologic: negative for hay fever and urticaria        Objective:  Blood pressure 111/73, pulse 87, height 5\' 4"  (1.626  m), weight 146 lb 1.6 oz (66.3 kg), last menstrual period 05/07/2019. Body mass index is 25.08 kg/m.     General Appearance:    Alert, cooperative, no distress, appears stated age  Head:    Normocephalic, without obvious abnormality, atraumatic  Eyes:    PERRL, conjunctiva/corneas clear, EOM's intact, both eyes  Ears:    Normal external ear canals, both ears  Nose:   Nares normal, septum midline, mucosa normal, no drainage or sinus tenderness  Throat:   Lips, mucosa, and tongue normal; teeth and gums normal  Neck:   Supple, symmetrical, trachea midline, no adenopathy; thyroid: no enlargement/tenderness/nodules; no carotid bruit or JVD  Back:     Symmetric, no curvature, ROM normal, no CVA tenderness  Lungs:  Clear to auscultation bilaterally, respirations unlabored  Chest Wall:    No tenderness or deformity   Heart:    Regular rate and rhythm, S1 and S2 normal, no murmur, rub or gallop  Breast Exam:    No tenderness, masses, or nipple abnormality  Abdomen:     Soft, non-tender, bowel sounds active all four quadrants, no masses, no organomegaly.    Genitalia:    Pelvic:external genitalia normal, vagina without lesions, discharge, or tenderness, rectovaginal septum  normal. Cervix normal in appearance, no cervical motion tenderness, no adnexal masses or tenderness.  Uterus normal size, shape, mobile, regular contours, nontender.  Rectal:    Normal external sphincter.  No hemorrhoids appreciated. Internal exam not done.   Extremities:   Extremities normal, atraumatic, no cyanosis or edema  Pulses:   2+ and symmetric all extremities  Skin:   Skin color, texture, turgor normal, no rashes or lesions  Lymph nodes:   Cervical, supraclavicular, and axillary nodes normal  Neurologic:   CNII-XII intact, normal strength, sensation and reflexes throughout     Labs:  Performed by PCP, in Care Everywhere  Assessment:   Well woman exam with routine gynecological exam Endometriosis Secondary  female infertility Cervical cancer screening  Plan:    - Blood tests: None. Up to date. Performed by PCP. . - Breast self exam technique reviewed and patient encouraged to perform self-exam monthly. - Contraception: none.  Patient notes history of infertility for the past 5 years. Notes use of Clomid for ~ 2 cycles last year before trying IUI x 1 cycle (notes after this she was disheartened and decided not to try again that year).  RIght now she and her husband are not actively trying, but are also not actively preventing pregnancy.  Discussed other alternative methods for aiding conception (including use of Femara). Patient notes she desires to think about it.  - Discussed healthy lifestyle modifications. - Pap smear up to date. Patient still desires for it to be performed today despite informing of recommendations of screening q 3-5 years. Notes that her insurance will cover yearly paps so she would like to have one. Performed at patient's request.   - Endometriosis currently asymptomatic.  - Follow up in 1 year, unless otherwise indicated.     Rubie Maid, MD [facility]

## 2019-05-17 DIAGNOSIS — M9902 Segmental and somatic dysfunction of thoracic region: Secondary | ICD-10-CM | POA: Diagnosis not present

## 2019-05-17 DIAGNOSIS — M5387 Other specified dorsopathies, lumbosacral region: Secondary | ICD-10-CM | POA: Diagnosis not present

## 2019-05-17 DIAGNOSIS — G5721 Lesion of femoral nerve, right lower limb: Secondary | ICD-10-CM | POA: Diagnosis not present

## 2019-05-17 DIAGNOSIS — M531 Cervicobrachial syndrome: Secondary | ICD-10-CM | POA: Diagnosis not present

## 2019-05-17 LAB — CYTOLOGY - PAP
Diagnosis: NEGATIVE
HPV: NOT DETECTED

## 2019-06-07 DIAGNOSIS — Z08 Encounter for follow-up examination after completed treatment for malignant neoplasm: Secondary | ICD-10-CM | POA: Diagnosis not present

## 2019-06-07 DIAGNOSIS — B078 Other viral warts: Secondary | ICD-10-CM | POA: Diagnosis not present

## 2019-06-07 DIAGNOSIS — D2262 Melanocytic nevi of left upper limb, including shoulder: Secondary | ICD-10-CM | POA: Diagnosis not present

## 2019-06-07 DIAGNOSIS — Z85828 Personal history of other malignant neoplasm of skin: Secondary | ICD-10-CM | POA: Diagnosis not present

## 2019-06-07 DIAGNOSIS — R238 Other skin changes: Secondary | ICD-10-CM | POA: Diagnosis not present

## 2019-06-07 DIAGNOSIS — D2261 Melanocytic nevi of right upper limb, including shoulder: Secondary | ICD-10-CM | POA: Diagnosis not present

## 2019-06-07 DIAGNOSIS — D2272 Melanocytic nevi of left lower limb, including hip: Secondary | ICD-10-CM | POA: Diagnosis not present

## 2019-06-07 DIAGNOSIS — Z872 Personal history of diseases of the skin and subcutaneous tissue: Secondary | ICD-10-CM | POA: Diagnosis not present

## 2019-07-10 DIAGNOSIS — H5203 Hypermetropia, bilateral: Secondary | ICD-10-CM | POA: Diagnosis not present

## 2019-08-21 ENCOUNTER — Ambulatory Visit: Payer: Self-pay

## 2019-08-21 DIAGNOSIS — Z23 Encounter for immunization: Secondary | ICD-10-CM

## 2019-12-11 DIAGNOSIS — D2262 Melanocytic nevi of left upper limb, including shoulder: Secondary | ICD-10-CM | POA: Diagnosis not present

## 2019-12-11 DIAGNOSIS — R238 Other skin changes: Secondary | ICD-10-CM | POA: Diagnosis not present

## 2019-12-11 DIAGNOSIS — B078 Other viral warts: Secondary | ICD-10-CM | POA: Diagnosis not present

## 2019-12-11 DIAGNOSIS — D2261 Melanocytic nevi of right upper limb, including shoulder: Secondary | ICD-10-CM | POA: Diagnosis not present

## 2019-12-11 DIAGNOSIS — L821 Other seborrheic keratosis: Secondary | ICD-10-CM | POA: Diagnosis not present

## 2019-12-11 DIAGNOSIS — D225 Melanocytic nevi of trunk: Secondary | ICD-10-CM | POA: Diagnosis not present

## 2019-12-11 DIAGNOSIS — D2272 Melanocytic nevi of left lower limb, including hip: Secondary | ICD-10-CM | POA: Diagnosis not present

## 2019-12-11 DIAGNOSIS — Z85828 Personal history of other malignant neoplasm of skin: Secondary | ICD-10-CM | POA: Diagnosis not present

## 2020-01-10 ENCOUNTER — Other Ambulatory Visit: Payer: Self-pay

## 2020-01-15 ENCOUNTER — Ambulatory Visit: Payer: Self-pay

## 2020-01-16 NOTE — Progress Notes (Signed)
Patient comes in today for labs. Patient is scheduled on 01/22/2020 with Gerarda Fraction, PA-C.

## 2020-01-17 ENCOUNTER — Other Ambulatory Visit: Payer: Self-pay

## 2020-01-17 DIAGNOSIS — Z0000 Encounter for general adult medical examination without abnormal findings: Secondary | ICD-10-CM

## 2020-01-18 LAB — CMP12+LP+TP+TSH+6AC+CBC/D/PLT
ALT: 22 IU/L (ref 0–32)
AST: 24 IU/L (ref 0–40)
Albumin/Globulin Ratio: 1.7 (ref 1.2–2.2)
Albumin: 4.4 g/dL (ref 3.8–4.8)
Alkaline Phosphatase: 80 IU/L (ref 39–117)
BUN/Creatinine Ratio: 11 (ref 9–23)
BUN: 9 mg/dL (ref 6–20)
Basophils Absolute: 0 10*3/uL (ref 0.0–0.2)
Basos: 1 %
Bilirubin Total: 0.4 mg/dL (ref 0.0–1.2)
Calcium: 9.2 mg/dL (ref 8.7–10.2)
Chloride: 104 mmol/L (ref 96–106)
Chol/HDL Ratio: 3.8 ratio (ref 0.0–4.4)
Cholesterol, Total: 198 mg/dL (ref 100–199)
Creatinine, Ser: 0.85 mg/dL (ref 0.57–1.00)
EOS (ABSOLUTE): 0.1 10*3/uL (ref 0.0–0.4)
Eos: 2 %
Estimated CHD Risk: 0.8 times avg. (ref 0.0–1.0)
Free Thyroxine Index: 2 (ref 1.2–4.9)
GFR calc Af Amer: 102 mL/min/{1.73_m2} (ref >59)
GFR calc non Af Amer: 88 mL/min/{1.73_m2} (ref >59)
GGT: 25 IU/L (ref 0–60)
Globulin, Total: 2.6 g/dL (ref 1.5–4.5)
Glucose: 86 mg/dL (ref 65–99)
HDL: 52 mg/dL (ref >39)
Hematocrit: 37.9 % (ref 34.0–46.6)
Hemoglobin: 12.7 g/dL (ref 11.1–15.9)
Immature Grans (Abs): 0 10*3/uL (ref 0.0–0.1)
Immature Granulocytes: 0 %
Iron: 73 ug/dL (ref 27–159)
LDH: 164 IU/L (ref 119–226)
LDL Chol Calc (NIH): 126 mg/dL — ABNORMAL HIGH (ref 0–99)
Lymphocytes Absolute: 1.6 10*3/uL (ref 0.7–3.1)
Lymphs: 24 %
MCH: 29.1 pg (ref 26.6–33.0)
MCHC: 33.5 g/dL (ref 31.5–35.7)
MCV: 87 fL (ref 79–97)
Monocytes Absolute: 0.5 10*3/uL (ref 0.1–0.9)
Monocytes: 8 %
Neutrophils Absolute: 4.3 10*3/uL (ref 1.4–7.0)
Neutrophils: 65 %
Phosphorus: 3.3 mg/dL (ref 3.0–4.3)
Platelets: 254 10*3/uL (ref 150–450)
Potassium: 4.3 mmol/L (ref 3.5–5.2)
RBC: 4.37 x10E6/uL (ref 3.77–5.28)
RDW: 13.1 % (ref 11.7–15.4)
Sodium: 140 mmol/L (ref 134–144)
T3 Uptake Ratio: 25 % (ref 24–39)
T4, Total: 7.8 ug/dL (ref 4.5–12.0)
TSH: 2.06 u[IU]/mL (ref 0.450–4.500)
Total Protein: 7 g/dL (ref 6.0–8.5)
Triglycerides: 109 mg/dL (ref 0–149)
Uric Acid: 2.7 mg/dL (ref 2.6–6.2)
VLDL Cholesterol Cal: 20 mg/dL (ref 5–40)
WBC: 6.5 10*3/uL (ref 3.4–10.8)

## 2020-01-21 LAB — VITAMIN D 25 HYDROXY (VIT D DEFICIENCY, FRACTURES): Vit D, 25-Hydroxy: 34.7 ng/mL (ref 30.0–100.0)

## 2020-01-21 LAB — SPECIMEN STATUS REPORT

## 2020-01-22 ENCOUNTER — Other Ambulatory Visit: Payer: Self-pay

## 2020-01-22 ENCOUNTER — Ambulatory Visit: Payer: Self-pay | Admitting: Registered Nurse

## 2020-01-22 VITALS — BP 112/70 | HR 87 | Temp 98.5°F | Wt 147.3 lb

## 2020-01-22 DIAGNOSIS — Z85828 Personal history of other malignant neoplasm of skin: Secondary | ICD-10-CM

## 2020-01-22 DIAGNOSIS — Z0000 Encounter for general adult medical examination without abnormal findings: Secondary | ICD-10-CM

## 2020-01-22 DIAGNOSIS — Z859 Personal history of malignant neoplasm, unspecified: Secondary | ICD-10-CM

## 2020-01-22 DIAGNOSIS — Z86018 Personal history of other benign neoplasm: Secondary | ICD-10-CM

## 2020-01-22 DIAGNOSIS — Z8632 Personal history of gestational diabetes: Secondary | ICD-10-CM

## 2020-01-22 DIAGNOSIS — M67439 Ganglion, unspecified wrist: Secondary | ICD-10-CM | POA: Insufficient documentation

## 2020-01-22 DIAGNOSIS — N809 Endometriosis, unspecified: Secondary | ICD-10-CM

## 2020-01-22 DIAGNOSIS — E785 Hyperlipidemia, unspecified: Secondary | ICD-10-CM

## 2020-01-22 DIAGNOSIS — J45990 Exercise induced bronchospasm: Secondary | ICD-10-CM

## 2020-01-22 DIAGNOSIS — Z6825 Body mass index (BMI) 25.0-25.9, adult: Secondary | ICD-10-CM

## 2020-01-22 MED ORDER — ALBUTEROL SULFATE HFA 108 (90 BASE) MCG/ACT IN AERS: 1.0000 | INHALATION_SPRAY | Freq: Four times a day (QID) | RESPIRATORY_TRACT | 1 refills | Status: DC | PRN

## 2020-01-22 NOTE — Progress Notes (Signed)
Subjective:    Patient ID: Diana Brooks, female    DOB: 31-Dec-1980, 39 y.o.   MRN: [id number]  39y/o caucasian female established patient here for annual physical and discuss lab results.  Had gestational diabetes 8 years ago.  Has been working on diet/exercise to help ensure she doesn't develop type II diabetes as OB counseled her it could progress.  Never was on medications all diet controlled per patient.  Last gynecology appt a year ago and has her annual with gyn scheduled for this year.  Her wrist no reoccurence of wrist ganglion cyst surgical removal 2016.  She has been running, clean eating, decreased meat and increased organic fruits and vegetables.  PMHx elevated cholesterol and low Vitamin D taking supplement daily.  Her albuterol inhaler expired at home and hasn't needed to use it for exercise induced asthma but would like to have a good inhaler at home in case she does get flare/need it.   think I have grown out of it.  Last tetanus during pregnancy.  On chart review was due 2013 and patient remembers getting at [facility] office she is going to contact them to get records and if unable to get records she will come in for booster.  She received her covid vaccine from [facility] second dose 1 Feb.  She will bring in her vaccination cards to clinic for transcription into system she does not have today.  Denied problems after vaccine.  She saw derm last month and continuing every 6 month visits as history nonmelanoma skin cancer forehead MOHS procedure 2018 reviewed epic was squamous cell carcinoma in situ and treated with efludex also. 2017 basal cell carcinoma facial.  Noted Epic 2015 [facility] D[facility]atology pathology/shave biopsy back:  Compound dysplastic (Clark's) nevus.  Per paper chart review located at [facility]:  Last physical COB 04/10/2017 wt 156lbs GYN 05/15/2019 146lbs PMHx endometriosis diagnostic laparoscopy and biopsies 2017 [id number] infertility secondary, elevated  cholesterol, gestational diabetes with pregnancy 8 years ago      Review of Systems  Constitutional: Negative for activity change, appetite change, chills, diaphoresis, fatigue, fever and unexpected weight change.  HENT: Negative for congestion, ear pain, sinus pressure, sinus pain, trouble swallowing and voice change.   Eyes: Negative for photophobia, pain, discharge and visual disturbance.  Respiratory: Negative for cough, choking, chest tightness, shortness of breath, wheezing and stridor.   Cardiovascular: Negative for chest pain, palpitations and leg swelling.  Gastrointestinal: Negative for abdominal pain, blood in stool, constipation, diarrhea, nausea and vomiting.  Endocrine: Negative for cold intolerance and heat intolerance.  Genitourinary: Negative for difficulty urinating and menstrual problem.  Musculoskeletal: Negative for arthralgias, back pain, gait problem, joint swelling, myalgias, neck pain and neck stiffness.  Skin: Negative for color change, rash and wound.  Allergic/Immunologic: Negative for environmental allergies, food allergies and immunocompromised state.  Neurological: Negative for dizziness, tremors, seizures, syncope, speech difficulty, weakness, light-headedness and headaches.  Hematological: Negative for adenopathy. Does not bruise/bleed easily.  Psychiatric/Behavioral: Negative for agitation, confusion and sleep disturbance. The patient is not nervous/anxious.        Objective:   Physical Exam Vitals and nursing note reviewed.  Constitutional:      General: She is awake. She is not in acute distress.    Appearance: Normal appearance. She is well-developed, well-groomed and normal weight. She is not ill-appearing, toxic-appearing or diaphoretic.  HENT:     Head: Normocephalic and atraumatic.     Jaw: There is normal jaw occlusion.  No trismus.     Salivary Glands: Right salivary gland is not diffusely enlarged or tender. Left salivary gland is not  diffusely enlarged or tender.     Right Ear: Hearing, ear canal and external ear normal. A middle ear effusion is present. There is no impacted cerumen.     Left Ear: Hearing, ear canal and external ear normal. A middle ear effusion is present. There is no impacted cerumen.     Nose: Nose normal. No nasal deformity, septal deviation, signs of injury, laceration, nasal tenderness, mucosal edema, congestion or rhinorrhea.     Right Sinus: No maxillary sinus tenderness or frontal sinus tenderness.     Left Sinus: No maxillary sinus tenderness or frontal sinus tenderness.     Mouth/Throat:     Lips: Pink. No lesions.     Mouth: Mucous membranes are moist. Mucous membranes are not pale, not dry and not cyanotic.     Pharynx: Oropharynx is clear.  Eyes:     General: Lids are normal. Vision grossly intact. Gaze aligned appropriately. Allergic shiner present. No visual field deficit or scleral icterus.       Right eye: No foreign body, discharge or hordeolum.        Left eye: No foreign body, discharge or hordeolum.     Extraocular Movements: Extraocular movements intact.     Right eye: Normal extraocular motion and no nystagmus.     Left eye: Normal extraocular motion and no nystagmus.     Conjunctiva/sclera: Conjunctivae normal.     Right eye: Right conjunctiva is not injected. No chemosis, exudate or hemorrhage.    Left eye: Left conjunctiva is not injected. No chemosis, exudate or hemorrhage.    Pupils: Pupils are equal, round, and reactive to light. Pupils are equal.     Right eye: Pupil is round and reactive.     Left eye: Pupil is round and reactive.  Neck:     Thyroid: Thyroid tenderness present. No thyroid mass or thyromegaly.     Trachea: Trachea and phonation normal. No tracheal tenderness or tracheal deviation.  Cardiovascular:     Rate and Rhythm: Normal rate and regular rhythm.     Chest Wall: PMI is not displaced.     Pulses:          Radial pulses are 2+ on the right side and 2+  on the left side.     Heart sounds: Normal heart sounds, S1 normal and S2 normal. No murmur. No friction rub. No gallop.   Pulmonary:     Effort: Pulmonary effort is normal. No accessory muscle usage or respiratory distress.     Breath sounds: Normal breath sounds and air entry. No stridor, decreased air movement or transmitted upper airway sounds. No decreased breath sounds, wheezing, rhonchi or rales.     Comments: Wearing cloth mask due to covid 19 pandemic; spoke full sentences without difficulty; no cough observed in exam room Chest:     Chest wall: No tenderness.  Abdominal:     General: Abdomen is flat. Bowel sounds are normal. There is no distension or abdominal bruit. There are no signs of injury.     Palpations: Abdomen is soft. There is no shifting dullness, fluid wave, hepatomegaly, splenomegaly, mass or pulsatile mass.     Tenderness: There is no abdominal tenderness. There is no right CVA tenderness, left CVA tenderness, guarding or rebound. Negative signs include Murphy's sign.  Musculoskeletal:        General:  No swelling or tenderness. Normal range of motion.     Right shoulder: Normal.     Left shoulder: Normal.     Right elbow: Normal.     Left elbow: Normal.     Right hand: Normal.     Left hand: Normal.     Cervical back: Normal, normal range of motion and neck supple. No swelling, edema, deformity, erythema, signs of trauma, lacerations, rigidity, spasms, torticollis, tenderness, bony tenderness or crepitus. No pain with movement, spinous process tenderness or muscular tenderness. Normal range of motion.     Thoracic back: Normal.     Lumbar back: Normal.     Right hip: Normal.     Left hip: Normal.     Right knee: Normal.     Left knee: Normal.     Right lower leg: No edema.     Left lower leg: No edema.  Lymphadenopathy:     Head:     Right side of head: No submental, submandibular, tonsillar, preauricular, posterior auricular or occipital adenopathy.     Left  side of head: No submental, submandibular, tonsillar, preauricular, posterior auricular or occipital adenopathy.     Cervical: No cervical adenopathy.     Right cervical: No superficial, deep or posterior cervical adenopathy.    Left cervical: No superficial, deep or posterior cervical adenopathy.  Skin:    General: Skin is warm and dry.     Capillary Refill: Capillary refill takes less than 2 seconds.     Coloration: Skin is not ashen, cyanotic, jaundiced, mottled, pale or sallow.     Findings: No abrasion, abscess, acne, bruising, burn, ecchymosis, erythema, signs of injury, laceration, lesion, petechiae, rash or wound.     Nails: There is no clubbing.     Comments: Scar midforehead to right lateral healed and wrist linear healed  Neurological:     General: No focal deficit present.     Mental Status: She is alert and oriented to person, place, and time. Mental status is at baseline. She is not disoriented.     GCS: GCS eye subscore is 4. GCS verbal subscore is 5. GCS motor subscore is 6.     Cranial Nerves: Cranial nerves are intact. No cranial nerve deficit, dysarthria or facial asymmetry.     Sensory: Sensation is intact. No sensory deficit.     Motor: Motor function is intact. No weakness, tremor, atrophy, abnormal muscle tone or seizure activity.     Coordination: Coordination is intact. Coordination normal.     Gait: Gait is intact. Gait normal.     Comments: Gait sure and steady in clinic; on/off exam table and in/out of chair without difficulty; bilateral hand grasp equal 5/5  Psychiatric:        Attention and Perception: Attention and perception normal.        Mood and Affect: Mood and affect normal.        Speech: Speech normal.        Behavior: Behavior normal. Behavior is cooperative.        Thought Content: Thought content normal.        Cognition and Memory: Cognition and memory normal.        Judgment: Judgment normal.     Patient given printed copy of 01/17/2020 lab  results and results note/instructions and discussed in detail and comparison to previous results in paper chart/trends.   Results for Diana, Brooks (MRN [id number]) as of 01/23/2020 08:35  Ref. Range 01/17/2020  08:21  Sodium Latest Ref Range: 134 - 144 mmol/L 140  Potassium Latest Ref Range: 3.5 - 5.2 mmol/L 4.3  Chloride Latest Ref Range: 96 - 106 mmol/L 104  Glucose Latest Ref Range: 65 - 99 mg/dL 86  BUN Latest Ref Range: 6 - 20 mg/dL 9  Creatinine Latest Ref Range: 0.57 - 1.00 mg/dL 0.85  Calcium Latest Ref Range: 8.7 - 10.2 mg/dL 9.2  BUN/Creatinine Ratio Latest Ref Range: 9 - 23  11  Phosphorus Latest Ref Range: 3.0 - 4.3 mg/dL 3.3  Alkaline Phosphatase Latest Ref Range: 39 - 117 IU/L 80  Albumin Latest Ref Range: 3.8 - 4.8 g/dL 4.4  Albumin/Globulin Ratio Latest Ref Range: 1.2 - 2.2  1.7  Uric Acid Latest Ref Range: 2.6 - 6.2 mg/dL 2.7  AST Latest Ref Range: 0 - 40 IU/L 24  ALT Latest Ref Range: 0 - 32 IU/L 22  Total Protein Latest Ref Range: 6.0 - 8.5 g/dL 7.0  Total Bilirubin Latest Ref Range: 0.0 - 1.2 mg/dL 0.4  GGT Latest Ref Range: 0 - 60 IU/L 25  GFR, Est Non African American Latest Ref Range: >59 mL/min/1.73 88  GFR, Est African American Latest Ref Range: >59 mL/min/1.73 102  Estimated CHD Risk Latest Ref Range: 0.0 - 1.0 times avg. 0.8  LDH Latest Ref Range: 119 - 226 IU/L 164  Total CHOL/HDL Ratio Latest Ref Range: 0.0 - 4.4 ratio 3.8  Cholesterol, Total Latest Ref Range: 100 - 199 mg/dL 198  HDL Cholesterol Latest Ref Range: >39 mg/dL 52  Triglycerides Latest Ref Range: 0 - 149 mg/dL 109  VLDL Cholesterol Cal Latest Ref Range: 5 - 40 mg/dL 20  LDL Chol Calc (NIH) Latest Ref Range: 0 - 99 mg/dL 126 (H)  Iron Latest Ref Range: 27 - 159 ug/dL 73  Vitamin D, 25-Hydroxy Latest Ref Range: 30.0 - 100.0 ng/mL 34.7  Globulin, Total Latest Ref Range: 1.5 - 4.5 g/dL 2.6  WBC Latest Ref Range: 3.4 - 10.8 x10E3/uL 6.5  RBC Latest Ref Range: 3.77 - 5.28 x10E6/uL 4.37   Hemoglobin Latest Ref Range: 11.1 - 15.9 g/dL 12.7  HCT Latest Ref Range: 34.0 - 46.6 % 37.9  MCV Latest Ref Range: 79 - 97 fL 87  MCH Latest Ref Range: 26.6 - 33.0 pg 29.1  MCHC Latest Ref Range: 31.5 - 35.7 g/dL 33.5  RDW Latest Ref Range: 11.7 - 15.4 % 13.1  Platelets Latest Ref Range: 150 - 450 x10E3/uL 254  Neutrophils Latest Ref Range: Not Estab. % 65  Immature Granulocytes Latest Ref Range: Not Estab. % 0  NEUT# Latest Ref Range: 1.4 - 7.0 x10E3/uL 4.3  Lymphocyte # Latest Ref Range: 0.7 - 3.1 x10E3/uL 1.6  Monocytes Absolute Latest Ref Range: 0.1 - 0.9 x10E3/uL 0.5  Basophils Absolute Latest Ref Range: 0.0 - 0.2 x10E3/uL 0.0  Immature Grans (Abs) Latest Ref Range: 0.0 - 0.1 x10E3/uL 0.0  Lymphs Latest Ref Range: Not Estab. % 24  Monocytes Latest Ref Range: Not Estab. % 8  Basos Latest Ref Range: Not Estab. % 1  Eos Latest Ref Range: Not Estab. % 2  EOS (ABSOLUTE) Latest Ref Range: 0.0 - 0.4 x10E3/uL 0.1  TSH Latest Ref Range: 0.450 - 4.500 uIU/mL 2.060  Thyroxine (T4) Latest Ref Range: 4.5 - 12.0 ug/dL 7.8  Free Thyroxine Index Latest Ref Range: 1.2 - 4.9  2.0  T3 Uptake Ratio Latest Ref Range: 24 - 39 % 25   2008-present range of lipid results: cholesterol total  [id number] trigs 68-331 HDL 39-54 LDL 90-119    Assessment & Plan:  A-exercise induced asthma mild, hyperlipidemia LDL goal less than 100; annual physical exam, endometriosis, BMI 25, history of gestational diabetes, history of squamous and basal cell carcinoma, history of dysplastic nevus, history low vitamin d  P-patient has received flu/covid vaccines for this season.  Will bring in her covid vaccination cards for transcription into epic.  Patient does not have on file last tetanus booster information/checking with her OB provider as she thinks received in their office 2013.  If unable to locate documentation she will return for Tdap booster at [facility] as none noted in [place] or her paper chart since notation  booster due 2013.  Refilled albuterol 62mcg 1-2puffs po q4-6h prn cough/wheeze/chest tightness #1 RF1 electronic Rx sent to her pharmacy of choice history exercise induced asthma.  Discussed with patient can perform PFTs once covid pandemic PFT restriction lifted to see if still has measurable exercise induced asthma at [facility].  Carry her inhaler on her person.  BMI 25 patient continuing exercise and diet goal is to lose a few more pounds.  I recommended 150 minutes exercise per week, 10,000 steps per day, dietary fiber 30 grams per day, water intake to keep urine pale yellow clear and voiding every 4 hours when awake.  Patient to continue daily oral vitamin D as level low normal. Level 34.7  Discussed skin makes most of Vitamin D source needs 15 minutes sun exposure arms/legs per day. Exitcare handout printed and given on low vitamin D.  Ganglion cyst no reoccurrence after surgical removal healed NOS.   Follow up in 1 year for annual labs/physical. Discussed studies show 45% of gestational diabetes patients progress to type II diabetes typically if pregnancy weight gained not lost after pregnancy due to increased strain on pancreas.   Patient verbalized understanding information/instructions, agreed with plan of care and had no further questions at this time.  Lipids improved with diet/exercise.  Discussed triglycerides have decreased a great deal, total cholesterol high normal, HDL improved LDL still elevated above goal--continue activity/increase fiber a bit more  Patient has decreased red meat consumption and increased lean proteins/fruits and vegetables.  Consider quinoa/oatmeal/brown rice when eating grains.  Beans also high in fiber.  Discussed lifestyle modification re diet to decrease white starches/sugars e.g. potatoes/bread increase omega 3 sources nuts, fish options add more fruits and vegetables and lean protein choices such as hummus or yogurt. Patient given exitcare handout on high  cholesterol. Discussed weight loss. Only has 1 grandparent with history CAD.  Parents healthy per patient.  If worsening consider starting statin low dose.  Exitcare handout printed and given on hyperlipidemia/high fiber diet to patient.  Patient agreed with plan of care and verbalized understanding of information/instructions and had no further questions at this time.   Keep follow up appt with gyn for infertility, endometriosis, breast cancer screenings, contraceptive needs.  Patient agreed with plan of care and verbalized understanding of information/instructions and had no further questions at this time.    Continue her follow up every 6 months with dermatology sooner if skin color/lesion changes noted.  Continue sun protection/avoidance regimen.  Patient agreed with plan of care and verbalized understanding of information/instructions and had no further questions at this time.

## 2020-01-22 NOTE — Patient Instructions (Addendum)
Vitamin D Deficiency Vitamin D deficiency is when your body does not have enough vitamin D. Vitamin D is important to your body for many reasons:  It helps the body absorb two important minerals--calcium and phosphorus.  It plays a role in bone health.  It may help to prevent some diseases, such as diabetes and multiple sclerosis.  It plays a role in muscle function, including heart function. If vitamin D deficiency is severe, it can cause a condition in which your bones become soft. In adults, this condition is called osteomalacia. In children, this condition is called rickets. What are the causes? This condition may be caused by:  Not eating enough foods that contain vitamin D.  Not getting enough natural sun exposure.  Having certain digestive system diseases that make it difficult for your body to absorb vitamin D. These diseases include Crohn's disease, chronic pancreatitis, and cystic fibrosis.  Having a surgery in which a part of the stomach or a part of the small intestine is removed.  Having chronic kidney disease or liver disease. What increases the risk? You are more likely to develop this condition if you:  Are older.  Do not spend much time outdoors.  Live in a long-term care facility.  Have had broken bones.  Have weak or thin bones (osteoporosis).  Have a disease or condition that changes how the body absorbs vitamin D.  Have dark skin.  Take certain medicines, such as steroid medicines or certain seizure medicines.  Are overweight or obese. What are the signs or symptoms? In mild cases of vitamin D deficiency, there may not be any symptoms. If the condition is severe, symptoms may include:  Bone pain.  Muscle pain.  Falling often.  Broken bones caused by a minor injury. How is this diagnosed? This condition may be diagnosed with blood tests. Imaging tests such as X-rays may also be done to look for changes in the bone. How is this  treated? Treatment for this condition may depend on what caused the condition. Treatment options include:  Taking vitamin D supplements. Your health care provider will suggest what dose is best for you.  Taking a calcium supplement. Your health care provider will suggest what dose is best for you. Follow these instructions at home: Eating and drinking   Eat foods that contain vitamin D. Choices include: ? Fortified dairy products, cereals, or juices. Fortified means that vitamin D has been added to the food. Check the label on the package to see if the food is fortified. ? Fatty fish, such as salmon or trout. ? Eggs. ? Oysters. ? Mushrooms. The items listed above may not be a complete list of recommended foods and beverages. Contact a dietitian for more information. General instructions  Take medicines and supplements only as told by your health care provider.  Get regular, safe exposure to natural sunlight.  Do not use a tanning bed.  Maintain a healthy weight. Lose weight if needed.  Keep all follow-up visits as told by your health care provider. This is important. How is this prevented? You can get vitamin D by:  Eating foods that naturally contain vitamin D.  Eating or drinking products that have been fortified with vitamin D, such as cereals, juices, and dairy products (including milk).  Taking a vitamin D supplement or a multivitamin supplement that contains vitamin D.  Being in the sun. Your body naturally makes vitamin D when your skin is exposed to sunlight. Your body changes the sunlight into  a form of the vitamin that it can use. Contact a health care provider if:  Your symptoms do not go away.  You feel nauseous or you vomit.  You have fewer bowel movements than usual or are constipated. Summary  Vitamin D deficiency is when your body does not have enough vitamin D.  Vitamin D is important to your body for good bone health and muscle function, and it may  help prevent some diseases.  Vitamin D deficiency is primarily treated through supplementation. Your health care provider will suggest what dose is best for you.  You can get vitamin D by eating foods that contain vitamin D, by being in the sun, and by taking a vitamin D supplement or a multivitamin supplement that contains vitamin D. This information is not intended to replace advice given to you by your health care provider. Make sure you discuss any questions you have with your health care provider. Document Revised: 07/16/2018 Document Reviewed: 07/16/2018 Elsevier Patient Education  [place]. High-Fiber Diet Fiber, also called dietary fiber, is a type of carbohydrate that is found in fruits, vegetables, whole grains, and beans. A high-fiber diet can have many health benefits. Your health care provider may recommend a high-fiber diet to help:  Prevent constipation. Fiber can make your bowel movements more regular.  Lower your cholesterol.  Relieve the following conditions: ? Swelling of veins in the anus (hemorrhoids). ? Swelling and irritation (inflammation) of specific areas of the digestive tract (uncomplicated diverticulosis). ? A problem of the large intestine (colon) that sometimes causes pain and diarrhea (irritable bowel syndrome, IBS).  Prevent overeating as part of a weight-loss plan.  Prevent heart disease, type 2 diabetes, and certain cancers. What is my plan? The recommended daily fiber intake in grams (g) includes:  38 g for men age 4 or younger.  30 g for men over age 69.  51 g for women age 59 or younger.  21 g for women over age 34. You can get the recommended daily intake of dietary fiber by:  Eating a variety of fruits, vegetables, grains, and beans.  Taking a fiber supplement, if it is not possible to get enough fiber through your diet. What do I need to know about a high-fiber diet?  It is better to get fiber through food sources rather than  from fiber supplements. There is not a lot of research about how effective supplements are.  Always check the fiber content on the nutrition facts label of any prepackaged food. Look for foods that contain 5 g of fiber or more per serving.  Talk with a diet and nutrition specialist (dietitian) if you have questions about specific foods that are recommended or not recommended for your medical condition, especially if those foods are not listed below.  Gradually increase how much fiber you consume. If you increase your intake of dietary fiber too quickly, you may have bloating, cramping, or gas.  Drink plenty of water. Water helps you to digest fiber. What are tips for following this plan?  Eat a wide variety of high-fiber foods.  Make sure that half of the grains that you eat each day are whole grains.  Eat breads and cereals that are made with whole-grain flour instead of refined flour or white flour.  Eat brown rice, bulgur wheat, or millet instead of white rice.  Start the day with a breakfast that is high in fiber, such as a cereal that contains 5 g of fiber or more  per serving.  Use beans in place of meat in soups, salads, and pasta dishes.  Eat high-fiber snacks, such as berries, raw vegetables, nuts, and popcorn.  Choose whole fruits and vegetables instead of processed forms like juice or sauce. What foods can I eat?  Fruits Berries. Pears. Apples. Oranges. Avocado. Prunes and raisins. Dried figs. Vegetables Sweet potatoes. Spinach. Kale. Artichokes. Cabbage. Broccoli. Cauliflower. Green peas. Carrots. Squash. Grains Whole-grain breads. Multigrain cereal. Oats and oatmeal. Brown rice. Barley. Bulgur wheat. [place]. Quinoa. Bran muffins. Popcorn. Rye wafer crackers. Meats and other proteins [place], kidney, and pinto beans. Soybeans. Split peas. Lentils. Nuts and seeds. Dairy Fiber-fortified yogurt. Beverages Fiber-fortified soy milk. Fiber-fortified orange juice. Other  foods Fiber bars. The items listed above may not be a complete list of recommended foods and beverages. Contact a dietitian for more options. What foods are not recommended? Fruits Fruit juice. Cooked, strained fruit. Vegetables Fried potatoes. Canned vegetables. Well-cooked vegetables. Grains White bread. Pasta made with refined flour. White rice. Meats and other proteins Fatty cuts of meat. Fried chicken or fried fish. Dairy Milk. Yogurt. Cream cheese. Sour cream. Fats and oils Butters. Beverages Soft drinks. Other foods Cakes and pastries. The items listed above may not be a complete list of foods and beverages to avoid. Contact a dietitian for more information. Summary  Fiber is a type of carbohydrate. It is found in fruits, vegetables, whole grains, and beans.  There are many health benefits of eating a high-fiber diet, such as preventing constipation, lowering blood cholesterol, helping with weight loss, and reducing your risk of heart disease, diabetes, and certain cancers.  Gradually increase your intake of fiber. Increasing too fast can result in cramping, bloating, and gas. Drink plenty of water while you increase your fiber.  The best sources of fiber include whole fruits and vegetables, whole grains, nuts, seeds, and beans. This information is not intended to replace advice given to you by your health care provider. Make sure you discuss any questions you have with your health care provider. Document Revised: 09/11/2017 Document Reviewed: 09/11/2017 Elsevier Patient Education  [place]. High Cholesterol  High cholesterol is a condition in which the blood has high levels of a white, waxy, fat-like substance (cholesterol). The human body needs small amounts of cholesterol. The liver makes all the cholesterol that the body needs. Extra (excess) cholesterol comes from the food that we eat. Cholesterol is carried from the liver by the blood through the blood  vessels. If you have high cholesterol, deposits (plaques) may build up on the walls of your blood vessels (arteries). Plaques make the arteries narrower and stiffer. Cholesterol plaques increase your risk for heart attack and stroke. Work with your health care provider to keep your cholesterol levels in a healthy range. What increases the risk? This condition is more likely to develop in people who:  Eat foods that are high in animal fat (saturated fat) or cholesterol.  Are overweight.  Are not getting enough exercise.  Have a family history of high cholesterol. What are the signs or symptoms? There are no symptoms of this condition. How is this diagnosed? This condition may be diagnosed from the results of a blood test.  If you are older than age 73, your health care provider may check your cholesterol every 4-6 years.  You may be checked more often if you already have high cholesterol or other risk factors for heart disease. The blood test for cholesterol measures:  "Bad" cholesterol (LDL cholesterol). This  is the main type of cholesterol that causes heart disease. The desired level for LDL is less than 100.  "Good" cholesterol (HDL cholesterol). This type helps to protect against heart disease by cleaning the arteries and carrying the LDL away. The desired level for HDL is 60 or higher.  Triglycerides. These are fats that the body can store or burn for energy. The desired number for triglycerides is lower than 150.  Total cholesterol. This is a measure of the total amount of cholesterol in your blood, including LDL cholesterol, HDL cholesterol, and triglycerides. A healthy number is less than 200. How is this treated? This condition is treated with diet changes, lifestyle changes, and medicines. Diet changes  This may include eating more whole grains, fruits, vegetables, nuts, and fish.  This may also include cutting back on red meat and foods that have a lot of added  sugar. Lifestyle changes  Changes may include getting at least 40 minutes of aerobic exercise 3 times a week. Aerobic exercises include walking, biking, and swimming. Aerobic exercise along with a healthy diet can help you maintain a healthy weight.  Changes may also include quitting smoking. Medicines  Medicines are usually given if diet and lifestyle changes have failed to reduce your cholesterol to healthy levels.  Your health care provider may prescribe a statin medicine. Statin medicines have been shown to reduce cholesterol, which can reduce the risk of heart disease. Follow these instructions at home: Eating and drinking If told by your health care provider:  Eat chicken (without skin), fish, veal, shellfish, ground [place] breast, and round or loin cuts of red meat.  Do not eat fried foods or fatty meats, such as hot dogs and salami.  Eat plenty of fruits, such as apples.  Eat plenty of vegetables, such as broccoli, potatoes, and carrots.  Eat beans, peas, and lentils.  Eat grains such as barley, rice, couscous, and bulgur wheat.  Eat pasta without cream sauces.  Use skim or nonfat milk, and eat low-fat or nonfat yogurt and cheeses.  Do not eat or drink whole milk, cream, ice cream, egg yolks, or hard cheeses.  Do not eat stick margarine or tub margarines that contain trans fats (also called partially hydrogenated oils).  Do not eat saturated tropical oils, such as coconut oil and palm oil.  Do not eat cakes, cookies, crackers, or other baked goods that contain trans fats.  General instructions  Exercise as directed by your health care provider. Increase your activity level with activities such as gardening, walking, and taking the stairs.  Take over-the-counter and prescription medicines only as told by your health care provider.  Do not use any products that contain nicotine or tobacco, such as cigarettes and e-cigarettes. If you need help quitting, ask your  health care provider.  Keep all follow-up visits as told by your health care provider. This is important. Contact a health care provider if:  You are struggling to maintain a healthy diet or weight.  You need help to start on an exercise program.  You need help to stop smoking. Get help right away if:  You have chest pain.  You have trouble breathing. This information is not intended to replace advice given to you by your health care provider. Make sure you discuss any questions you have with your health care provider. Document Revised: 11/10/2017 Document Reviewed: 05/07/2016 Elsevier Patient Education  [place].

## 2020-01-23 DIAGNOSIS — Z8632 Personal history of gestational diabetes: Secondary | ICD-10-CM | POA: Insufficient documentation

## 2020-01-23 DIAGNOSIS — E785 Hyperlipidemia, unspecified: Secondary | ICD-10-CM | POA: Insufficient documentation

## 2020-01-23 DIAGNOSIS — Z86018 Personal history of other benign neoplasm: Secondary | ICD-10-CM | POA: Insufficient documentation

## 2020-01-23 DIAGNOSIS — Z859 Personal history of malignant neoplasm, unspecified: Secondary | ICD-10-CM | POA: Insufficient documentation

## 2020-01-23 DIAGNOSIS — J45990 Exercise induced bronchospasm: Secondary | ICD-10-CM | POA: Insufficient documentation

## 2020-01-23 DIAGNOSIS — Z6825 Body mass index (BMI) 25.0-25.9, adult: Secondary | ICD-10-CM | POA: Insufficient documentation

## 2020-02-17 ENCOUNTER — Telehealth: Payer: Self-pay | Admitting: Obstetrics and Gynecology

## 2020-02-17 NOTE — Telephone Encounter (Signed)
Patient called in saying her primary care provider needed to know when her last TDap vaccine was. Patient thinks that she had it here when she was pregnant back in 2013.

## 2020-02-17 NOTE — Telephone Encounter (Signed)
Spoke with pt and informed pt of the date she had her tdap vaccine and added the information to her chart.

## 2020-03-31 ENCOUNTER — Encounter: Payer: BC Managed Care – PPO | Admitting: Obstetrics and Gynecology

## 2020-04-15 ENCOUNTER — Ambulatory Visit (INDEPENDENT_AMBULATORY_CARE_PROVIDER_SITE_OTHER): Payer: 59 | Admitting: Obstetrics and Gynecology

## 2020-04-15 ENCOUNTER — Other Ambulatory Visit (HOSPITAL_COMMUNITY)
Admission: RE | Admit: 2020-04-15 | Discharge: 2020-04-15 | Disposition: A | Discharge status: Home / Self Care | Payer: 59 | Source: Ambulatory Visit | Attending: Obstetrics and Gynecology | Admitting: Obstetrics and Gynecology

## 2020-04-15 ENCOUNTER — Telehealth: Payer: Self-pay | Admitting: Obstetrics and Gynecology

## 2020-04-15 ENCOUNTER — Other Ambulatory Visit: Payer: Self-pay

## 2020-04-15 ENCOUNTER — Encounter: Payer: Self-pay | Admitting: Obstetrics and Gynecology

## 2020-04-15 VITALS — BP 117/82 | HR 79 | Ht 64.0 in | Wt 150.2 lb

## 2020-04-15 DIAGNOSIS — N809 Endometriosis, unspecified: Secondary | ICD-10-CM

## 2020-04-15 DIAGNOSIS — Z6825 Body mass index (BMI) 25.0-25.9, adult: Secondary | ICD-10-CM | POA: Diagnosis not present

## 2020-04-15 DIAGNOSIS — N979 Female infertility, unspecified: Secondary | ICD-10-CM | POA: Diagnosis not present

## 2020-04-15 DIAGNOSIS — Z01419 Encounter for gynecological examination (general) (routine) without abnormal findings: Secondary | ICD-10-CM

## 2020-04-15 DIAGNOSIS — Z124 Encounter for screening for malignant neoplasm of cervix: Secondary | ICD-10-CM

## 2020-04-15 NOTE — Progress Notes (Signed)
GYNECOLOGY ANNUAL PHYSICAL EXAM PROGRESS NOTE  Subjective:    Diana Brooks is a 39 y.o. G107P1001 female who  presents for an annual exam . Patient has a PMH of endometriosis. The patient has no complaints today. The patient is sexually active.  The patient wears seatbelts: yes. The patient participates in regular exercise: yes.   She does note that she has been more "weepy" and emotional lately over the past few days. Notes her cycle is due next week, but usually is not this emotional.    Gynecologic History Menarche age: 14 No LMP recorded.  Regular cycles (26 days), last 5-6 days, moderate to heavy flow. Reports mild dysmenorrhea.  Contraception: none. Has a history of infertility.  History of STI's: Denies Last Pap: 02/27/2018. Results were: normal.  Denies h/o abnormal pap smears.    OB History  Gravida Para Term Preterm AB Living  1 1 1  0 0 1  SAB TAB Ectopic Multiple Live Births  0 0 0 0 1    # Outcome Date GA Lbr Len/2nd Weight Sex Delivery Anes PTL Lv  1 Term 2013   8 lb 1.6 oz (3.674 kg) M CS-LTranv   LIV    Past Medical History:  Diagnosis Date  . Anemia   . Anxiety   . Asthma    exercise induced  . Complication of anesthesia   . Dysmenorrhea   . Endometriosis   . GDM (gestational diabetes mellitus)   . Hyperprolactinemia ([place])   . IBS (irritable bowel syndrome)   . Infertility, female   . PONV (postoperative nausea and vomiting)    states phenergan helps alot post op n/v    Past Surgical History:  Procedure Laterality Date  . CESAREAN SECTION    . CHROMOPERTUBATION N/A 01/11/2016   Procedure: CHROMOPERTUBATION OF FALLOPIAN TUBES;  Surgeon: Brayton Mars, MD;  Location: [facility] ORS;  Service: Gynecology;  Laterality: N/A;  . LAPAROSCOPY     lap with excision of fulguration of endometriosis  . LAPAROSCOPY N/A 01/11/2016   Procedure: LAPAROSCOPY DIAGNOSTIC/ WITH EXCISION AND FULGERATION OF ENDOMETRIOSIS; MYOMECTOMY;  Surgeon: Brayton Mars, MD;  Location: [facility] OR[facility];  Service: Gynecology;  Laterality: N/A;  . wrist srugery     ganglion cyst removed    Family History  Problem Relation Age of Onset  . Endometriosis Mother   . Rheum arthritis Mother   . Diabetes Paternal Grandfather   . Prostate cancer Maternal Uncle   . Healthy Father   . Heart disease Neg Hx   . Breast cancer Neg Hx   . Ovarian cancer Neg Hx   . Colon cancer Neg Hx     Social History   Socioeconomic History  . Marital status: Married    Spouse name: Not on file  . Number of children: Not on file  . Years of education: Not on file  . Highest education level: Not on file  Occupational History  . Not on file  Tobacco Use  . Smoking status: Never Smoker  . Smokeless tobacco: Never Used  Substance and Sexual Activity  . Alcohol use: Not Currently    Comment: rare  . Drug use: No  . Sexual activity: Yes    Birth control/protection: None  Other Topics Concern  . Not on file  Social History Narrative  . Not on file   Social Determinants of Health   Financial Resource Strain:   . Difficulty of Paying Living Expenses:   Food Insecurity:   .  Worried About Charity fundraiser in the Last Year:   . Arboriculturist in the Last Year:   Transportation Needs:   . Film/video editor (Medical):   Marland Kitchen Lack of Transportation (Non-Medical):   Physical Activity:   . Days of Exercise per Week:   . Minutes of Exercise per Session:   Stress:   . Feeling of Stress :   Social Connections:   . Frequency of Communication with Friends and Family:   . Frequency of Social Gatherings with Friends and Family:   . Attends Religious Services:   . Active Member of Clubs or Organizations:   . Attends Archivist Meetings:   Marland Kitchen Marital Status:   Intimate Partner Violence:   . Fear of Current or Ex-Partner:   . Emotionally Abused:   Marland Kitchen Physically Abused:   . Sexually Abused:     Current Outpatient Medications on File Prior to Visit    Medication Sig Dispense Refill  . acetaminophen (TYLENOL) 500 MG tablet Take 500 mg by mouth every 6 (six) hours as needed.    Marland Kitchen albuterol (VENTOLIN HFA) 108 (90 Base) MCG/ACT inhaler Inhale 1-2 puffs into the lungs every 6 (six) hours as needed for wheezing or shortness of breath. 6.7 g 1  . Cholecalciferol (VITAMIN D HIGH POTENCY PO) Take 5,000 Units by mouth daily.    . Multiple Vitamin (MULTIVITAMIN) tablet Take 1 tablet by mouth daily.    Marland Kitchen ibuprofen (ADVIL,MOTRIN) 800 MG tablet Take 1 tablet (800 mg total) by mouth 3 (three) times daily. (Patient not taking: Reported on 01/22/2020) 30 tablet 1   No current facility-administered medications on file prior to visit.    Allergies  Allergen Reactions  . Codeine Nausea And Vomiting  . Levaquin [Levofloxacin In D5w]   . Percocet [Oxycodone-Acetaminophen] Nausea And Vomiting  . Sulfa Antibiotics   . Suprax [Cefixime]   . Vicodin [Hydrocodone-Acetaminophen] Nausea And Vomiting      Review of Systems Constitutional: negative for chills, fatigue, fevers and sweats Eyes: negative for irritation, redness and visual disturbance Ears, nose, mouth, throat, and face: negative for hearing loss, nasal congestion, snoring and tinnitus Respiratory: negative for asthma, cough, sputum Cardiovascular: negative for chest pain, dyspnea, exertional chest pressure/discomfort, irregular heart beat, palpitations and syncope Gastrointestinal: negative for abdominal pain, change in bowel habits, nausea and vomiting Genitourinary: negative for abnormal menstrual periods, genital lesions, sexual problems and vaginal discharge, dysuria and urinary incontinence Integument/breast: negative for breast lump, breast tenderness and nipple discharge Hematologic/lymphatic: negative for bleeding and easy bruising Musculoskeletal:negative for back pain and muscle weakness Neurological: negative for dizziness, headaches, vertigo and weakness Endocrine: negative for  diabetic symptoms including polydipsia, polyuria and skin dryness Allergic/Immunologic: negative for hay fever and urticaria    Psychological: positive for - increased emotions, moody. Negative for anxiety, depression.      Objective:  Blood pressure 117/82, pulse 79, height 5\' 4"  (1.626 m), weight 150 lb 4 oz (68.2 kg). Body mass index is 25.28 kg/m.   General Appearance:    Alert, cooperative, no distress, appears stated age  Head:    Normocephalic, without obvious abnormality, atraumatic  Eyes:    PERRL, conjunctiva/corneas clear, EOM's intact, both eyes  Ears:    Normal external ear canals, both ears  Nose:   Nares normal, septum midline, mucosa normal, no drainage or sinus tenderness  Throat:   Lips, mucosa, and tongue normal; teeth and gums normal  Neck:   Supple, symmetrical, trachea midline,  no adenopathy; thyroid: no enlargement/tenderness/nodules; no carotid bruit or JVD  Back:     Symmetric, no curvature, ROM normal, no CVA tenderness  Lungs:     Clear to auscultation bilaterally, respirations unlabored  Chest Wall:    No tenderness or deformity   Heart:    Regular rate and rhythm, S1 and S2 normal, no murmur, rub or gallop  Breast Exam:    No tenderness, masses, or nipple abnormality  Abdomen:     Soft, non-tender, bowel sounds active all four quadrants, no masses, no organomegaly.    Genitalia:    Pelvic:external genitalia normal, vagina without lesions, discharge, or tenderness, rectovaginal septum  normal. Cervix normal in appearance, no cervical motion tenderness, no adnexal masses or tenderness.  Uterus normal size, shape, mobile, regular contours, nontender.  Rectal:    Normal external sphincter.  No hemorrhoids appreciated. Internal exam not done.   Extremities:   Extremities normal, atraumatic, no cyanosis or edema  Pulses:   2+ and symmetric all extremities  Skin:   Skin color, texture, turgor normal, no rashes or lesions  Lymph nodes:   Cervical, supraclavicular,  and axillary nodes normal  Neurologic:   CNII-XII intact, normal strength, sensation and reflexes throughout     Labs:  Lab Results  Component Value Date   WBC 6.5 01/17/2020   HGB 12.7 01/17/2020   HCT 37.9 01/17/2020   MCV 87 01/17/2020   PLT 254 01/17/2020      Chemistry      Component Value Date/Time   NA 140 01/17/2020 0821   K 4.3 01/17/2020 0821   CL 104 01/17/2020 0821   BUN 9 01/17/2020 0821   CREATININE 0.85 01/17/2020 0821      Component Value Date/Time   CALCIUM 9.2 01/17/2020 0821   ALKPHOS 80 01/17/2020 0821   AST 24 01/17/2020 0821   ALT 22 01/17/2020 0821   BILITOT 0.4 01/17/2020 0821      Lab Results  Component Value Date   CHOL 198 01/17/2020   HDL 52 01/17/2020   LDLCALC 126 (H) 01/17/2020   TRIG 109 01/17/2020   CHOLHDL 3.8 01/17/2020   Lab Results  Component Value Date   TSH 2.060 01/17/2020    No results found for: HGBA1C   Assessment:   Well woman exam with routine gynecological exam Endometriosis Secondary female infertility Cervical cancer screening Mood changes  Plan:    - Blood tests: None. Up to date. Performed by PCP. . - Breast self exam technique reviewed and patient encouraged to perform self-exam monthly. - Contraception: none.  Patient notes history of infertility for the past 6 years. Is ok if pregnancy ocurs.  - Discussed healthy lifestyle modifications. - Pap smear up to date. Patient still desires for it to be performed today, despite informing of recommendations of screening q 3-5 years. Notes that she has changed to a new insurance this year so will go for a yearly pap this time.  Performed at patient's request. - Endometriosis currently asymptomatic.  - Mood changes, discussed could be due to hormonal fluctuation. Also encouraged patient to monitor for possible pregnancy as next cycle is due soon. To notify MD if symptoms persist, or worsen.  - Follow up in 1 year, unless otherwise indicated.     Rubie Maid, MD [facility]

## 2020-04-15 NOTE — Progress Notes (Signed)
Pt present for annual exam. Pt stated that she was doing well no problems.  

## 2020-04-15 NOTE — Telephone Encounter (Signed)
Pt was checking out and making next appt for 2022. I told the pt that for insurance to cover it it has to be a year and a day out. 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 I made the appt and the pt was aware of what I said

## 2020-04-21 ENCOUNTER — Other Ambulatory Visit: Payer: Self-pay

## 2020-04-21 ENCOUNTER — Other Ambulatory Visit: Payer: 59

## 2020-04-21 DIAGNOSIS — Z3A01 Less than 8 weeks gestation of pregnancy: Secondary | ICD-10-CM

## 2020-04-22 LAB — BETA HCG QUANT (REF LAB): hCG Quant: 115 m[IU]/mL

## 2020-04-23 LAB — CYTOLOGY - PAP
Comment: NEGATIVE
Diagnosis: NEGATIVE
High risk HPV: NEGATIVE

## 2020-04-27 ENCOUNTER — Telehealth: Payer: Self-pay | Admitting: Obstetrics and Gynecology

## 2020-04-27 NOTE — Telephone Encounter (Signed)
Called pt to schedule her next visit. The pt stated she went to [place] and that is where she got her conformation from. The pt stated she is 5 weeks on this Wednesday the 9th. I told the pt that since she is already conformed that her next appt is a nurse visit and that its a 60 mins visit that this appt is about the pregnancy, lots of information. The pt wanted to know when she would hear the heart beat. I told the pt that she can ask her nurse at the visit. That I cant put an appt for a u/s without an order. The pt verbally understood.

## 2020-04-28 ENCOUNTER — Telehealth: Payer: Self-pay | Admitting: Obstetrics and Gynecology

## 2020-04-28 ENCOUNTER — Other Ambulatory Visit: Payer: Self-pay | Admitting: Surgical

## 2020-04-28 DIAGNOSIS — Z3491 Encounter for supervision of normal pregnancy, unspecified, first trimester: Secondary | ICD-10-CM

## 2020-04-28 NOTE — Telephone Encounter (Signed)
Called pt about her next appt on 6/18. The pt is coming in for a nurse visit. The pt will be 6 weeks we need to move her appt to 8-9 weeks. lmtrc

## 2020-05-18 ENCOUNTER — Other Ambulatory Visit: Payer: Self-pay

## 2020-05-18 ENCOUNTER — Telehealth: Payer: Self-pay

## 2020-05-18 VITALS — BP 117/71 | HR 93 | Resp 16

## 2020-05-18 DIAGNOSIS — O2620 Pregnancy care for patient with recurrent pregnancy loss, unspecified trimester: Secondary | ICD-10-CM

## 2020-05-18 DIAGNOSIS — N979 Female infertility, unspecified: Secondary | ICD-10-CM

## 2020-05-18 NOTE — Telephone Encounter (Signed)
Patient started spotting over the weekend, then today she went to the bathroom and her underwear was soaked in blood. Patient denies any abdominal pain, she stated she feels like twinges here and there but nothing like a cramp. Could you please advise?

## 2020-05-18 NOTE — Telephone Encounter (Signed)
Spoke with patient about her symptoms. I let her know that Dr. Marcelline Mates is out of the office today. I have placed her on Dr. Marcelline Mates schedule for in the morning at 8:30 AM. Patient said that she hasn't really had any pain with the bleeding. I told her that if she starts having pain or if the bleeding gets worse to go to the ER. I also told her that we do not have [place] today. Patient verbalized understanding and will be seen in the morning.

## 2020-05-19 ENCOUNTER — Ambulatory Visit (INDEPENDENT_AMBULATORY_CARE_PROVIDER_SITE_OTHER): Payer: 59 | Admitting: Obstetrics and Gynecology

## 2020-05-19 ENCOUNTER — Other Ambulatory Visit (INDEPENDENT_AMBULATORY_CARE_PROVIDER_SITE_OTHER): Payer: 59

## 2020-05-19 ENCOUNTER — Encounter: Payer: Self-pay | Admitting: Obstetrics and Gynecology

## 2020-05-19 VITALS — BP 128/75 | HR 96 | Ht 64.0 in | Wt 150.9 lb

## 2020-05-19 DIAGNOSIS — O209 Hemorrhage in early pregnancy, unspecified: Secondary | ICD-10-CM | POA: Diagnosis not present

## 2020-05-19 DIAGNOSIS — Z3A01 Less than 8 weeks gestation of pregnancy: Secondary | ICD-10-CM | POA: Diagnosis not present

## 2020-05-19 DIAGNOSIS — O26899 Other specified pregnancy related conditions, unspecified trimester: Secondary | ICD-10-CM

## 2020-05-19 DIAGNOSIS — Z6791 Unspecified blood type, Rh negative: Secondary | ICD-10-CM | POA: Diagnosis not present

## 2020-05-19 DIAGNOSIS — O200 Threatened abortion: Secondary | ICD-10-CM | POA: Diagnosis not present

## 2020-05-19 LAB — POCT URINALYSIS DIPSTICK OB
Bilirubin, UA: NEGATIVE
Glucose, UA: NEGATIVE
Ketones, UA: NEGATIVE
Leukocytes, UA: NEGATIVE
Nitrite, UA: NEGATIVE
POC,PROTEIN,UA: NEGATIVE
Spec Grav, UA: <=1.005 — AB (ref 1.010–1.025)
Urobilinogen, UA: 0.2 E.U./dL
pH, UA: 7 (ref 5.0–8.0)

## 2020-05-19 LAB — BETA HCG QUANT (REF LAB): hCG Quant: 95651 m[IU]/mL

## 2020-05-19 MED ORDER — RHO D IMMUNE GLOBULIN 1500 UNIT/2ML IJ SOSY
300.0000 ug | PREFILLED_SYRINGE | Freq: Once | INTRAMUSCULAR | Status: AC
  Administered 2020-05-19: 300 ug via INTRAMUSCULAR

## 2020-05-19 NOTE — Progress Notes (Signed)
Pt present due to having bleeding in early pregnancy. Pt is currently [redacted]w[redacted]d by LMP. Pt stated that she noticed the bleeding on Saturday and was spotting; no blood on Sunday but noticed bright red blood on Monday. Pt stated not having any cramping or other pain in the abd area. Rhogam injection administered today.

## 2020-05-19 NOTE — Patient Instructions (Signed)
Threatened Miscarriage  A threatened miscarriage occurs when a woman has vaginal bleeding during the first 20 weeks of pregnancy but the pregnancy has not ended. If you have vaginal bleeding during this time, your health care provider will do tests to make sure you are still pregnant. If the tests show that you are still pregnant and that the developing baby (fetus) inside your uterus is still growing, your condition is considered a threatened miscarriage. A threatened miscarriage does not mean your pregnancy will end, but it does increase the risk of losing your pregnancy (complete miscarriage). What are the causes? The cause of this condition is usually not known. For women who go on to have a complete miscarriage, the most common cause is an abnormal number of chromosomes in the developing baby. Chromosomes are the structures inside cells that hold all of a person's genetic material. What increases the risk? The following lifestyle factors may increase your risk of a miscarriage in early pregnancy:  Smoking.  Drinking excessive amounts of alcohol or caffeine.  Recreational drug use. The following preexisting health conditions may increase your risk of a miscarriage in early pregnancy:  Polycystic ovary syndrome.  Uterine fibroids.  Infections.  Diabetes mellitus. What are the signs or symptoms? Symptoms of this condition include:  Vaginal bleeding.  Mild abdominal pain or cramps. How is this diagnosed? If you have bleeding with or without abdominal pain before 20 weeks of pregnancy, your health care provider will do tests to check whether you are still pregnant. These will include:  Ultrasound. This test uses sound waves to create images of the inside of your uterus. This allows your health care provider to look at your developing baby and other structures, such as your placenta.  Pelvic exam. This is an internal exam of your vagina and cervix.  Measurement of your baby's heart  rate.  Laboratory tests such as blood tests, urine tests, or swabs for infection You may be diagnosed with a threatened miscarriage if:  Ultrasound testing shows that you are still pregnant.  Your baby's heart rate is strong.  A pelvic exam shows that the opening between your uterus and your vagina (cervix) is closed.  Blood tests confirm that you are still pregnant. How is this treated? No treatments have been shown to prevent a threatened miscarriage from going on to a complete miscarriage. However, the right home care is important. Follow these instructions at home:  Get plenty of rest.  Do not have sex or use tampons if you have vaginal bleeding.  Do not douche.  Do not smoke or use recreational drugs.  Do not drink alcohol.  Avoid caffeine.  Keep all follow-up prenatal visits as told by your health care provider. This is important. Contact a health care provider if:  You have light vaginal bleeding or spotting while pregnant.  You have abdominal pain or cramping.  You have a fever. Get help right away if:  You have heavy vaginal bleeding.  You have blood clots coming from your vagina.  You pass tissue from your vagina.  You leak fluid, or you have a gush of fluid from your vagina.  You have severe low back pain or abdominal cramps.  You have fever, chills, and severe abdominal pain. Summary  A threatened miscarriage occurs when a woman has vaginal bleeding during the first 20 weeks of pregnancy but the pregnancy has not ended.  The cause of a threatened miscarriage is usually not known.  Symptoms of this condition may   include vaginal bleeding and mild abdominal pain or cramps.  No treatments have been shown to prevent a threatened miscarriage from going on to a complete miscarriage.  Keep all follow-up prenatal visits as told by your health care provider. This is important. This information is not intended to replace advice given to you by your health  care provider. Make sure you discuss any questions you have with your health care provider. Document Revised: 12/14/2017 Document Reviewed: 02/03/2017 Elsevier Patient Education  2020 Elsevier Inc.  

## 2020-05-19 NOTE — Progress Notes (Signed)
    Subjective:    Diana Brooks is a 39 y.o. G93P1001 female who presents with complaints of vaginal bleeding since Saturday.  She is currently 7.6 weeks by last menstrual period 03/25/2020.  Bleeding started as light, on Saturday, had no bleeding on Sunday, but then noticed bright red bleeding with passage of a clot (but unsure if there was tissue present).  Today bleeding has turned to dark brown. She denies cramping.  Also noting some morning sickness on Sunday. Currently, she is not in acute distress. Ectopic risks: none.   Pregnancy testing: quant HCG level 95,161 on 05/18/2020. Pregnancy imaging: not done. Blood type: O negative (per patient).   The following portions of the patient's history were reviewed and updated as appropriate: allergies, current medications, past family history, past medical history, past social history, past surgical history and problem list.  Review of Systems Pertinent items noted in HPI and remainder of comprehensive ROS otherwise negative.   Objective:     BP 128/75   Pulse 96   Ht 5\' 4"  (1.626 m)   Wt 150 lb 14.4 oz (68.4 kg)   LMP 03/25/2020   BMI 25.90 kg/m  General:   alert and no distress  Abdomen: soft, non-tender, without masses or organomegaly                 Vulva: normal              Vagina:  Small amount of dark brown blood in vault               Cervix: no cervical motion tenderness, no lesions and closed               Uterus: normal size              Adnexa: normal adnexa and no mass, fullness, tenderness   Imaging    Patient Name: Diana Brooks DOB: 09-29-1981 MRN: [id number] ULTRASOUND REPORT  Location: [facility] Date of Service: 05/19/2020   Indications:Threatened AB Findings:  Nelda Marseille intrauterine pregnancy is visualized with a CRL consistent with [redacted]w[redacted]d gestation, giving an (U/S) EDD of 12/30/2020.   FHR: 171 BPM CRL measurement: 15.3 mm Yolk sac is visualized and appears normal and early anatomy  is normal. Amnion: visualized and appears normal   Right Ovary is normal in appearance. Left Ovary is normal appearance. Corpus luteal cyst:  Right ovary Survey of the adnexa demonstrates no adnexal masses. There is no free peritoneal fluid in the cul de sac.  Impression: 1. [redacted]w[redacted]d Viable Singleton Intrauterine pregnancy by U/S. 2. (U/S) EDD is 12/30/2020.  Recommendations: 1.Clinical correlation with the patient's History and Physical Exam.  Jenine M. Alessi     RDMS  Assessment:    IUP at [redacted] weeks gestation Threatened abortion   Plan:    Rhogam: given. Warning signs discussed: to call for increased bleeding, abdominal pain, light headedness, or if she has any concerns.   Return in 1-2 weeks for NOB intake.  Advised on pelvic rest until NOB physical.

## 2020-06-02 ENCOUNTER — Other Ambulatory Visit: Payer: 59

## 2020-06-02 ENCOUNTER — Other Ambulatory Visit: Payer: Self-pay

## 2020-06-02 ENCOUNTER — Ambulatory Visit (INDEPENDENT_AMBULATORY_CARE_PROVIDER_SITE_OTHER): Payer: 59 | Admitting: Surgical

## 2020-06-02 ENCOUNTER — Encounter: Payer: Self-pay | Admitting: Obstetrics and Gynecology

## 2020-06-02 ENCOUNTER — Ambulatory Visit (INDEPENDENT_AMBULATORY_CARE_PROVIDER_SITE_OTHER): Payer: 59

## 2020-06-02 VITALS — BP 109/71 | HR 85 | Ht 64.0 in | Wt 153.0 lb

## 2020-06-02 DIAGNOSIS — Z3491 Encounter for supervision of normal pregnancy, unspecified, first trimester: Secondary | ICD-10-CM | POA: Diagnosis not present

## 2020-06-02 NOTE — Progress Notes (Signed)
Diana Brooks presents for NOB nurse interview visit. Pregnancy confirmation done 05/19/20. G2. P1001. Pregnancy education material explained and given. 0 cats in home. NOB labs ordered. TSH/HbgA1c ordered due to BMI 30 or greater.  HIV labs and drug screen were explained and ordered. PNV encouraged. Genetic screening options discussed. Genetic testing: Unsure. Patient may discuss with the provider. Patient to follow up with provider in 2-3  weeks for NOB physical. All questions answered.  Patient thinks that she would like to have a scheduled C section for this birth. Previous delivery was by C section.

## 2020-06-03 LAB — URINALYSIS, ROUTINE W REFLEX MICROSCOPIC
Bilirubin, UA: NEGATIVE
Glucose, UA: NEGATIVE
Ketones, UA: NEGATIVE
Leukocytes,UA: NEGATIVE
Nitrite, UA: NEGATIVE
Protein,UA: NEGATIVE
Specific Gravity, UA: 1.016 (ref 1.005–1.030)
Urobilinogen, Ur: 0.2 mg/dL (ref 0.2–1.0)
pH, UA: 7.5 (ref 5.0–7.5)

## 2020-06-03 LAB — GC/CHLAMYDIA PROBE AMP
Chlamydia trachomatis, NAA: NEGATIVE
Neisseria Gonorrhoeae by PCR: NEGATIVE

## 2020-06-03 LAB — MICROSCOPIC EXAMINATION
Casts: NONE SEEN /lpf
Epithelial Cells (non renal): >10 /hpf — AB (ref 0–10)

## 2020-06-04 LAB — MONITOR DRUG PROFILE 14(MW)
Amphetamine Scrn, Ur: NEGATIVE ng/mL
BARBITURATE SCREEN URINE: NEGATIVE ng/mL
BENZODIAZEPINE SCREEN, URINE: NEGATIVE ng/mL
Buprenorphine, Urine: NEGATIVE ng/mL
CANNABINOIDS UR QL SCN: NEGATIVE ng/mL
Cocaine (Metab) Scrn, Ur: NEGATIVE ng/mL
Creatinine(Crt), U: 74.6 mg/dL (ref 20.0–300.0)
Fentanyl, Urine: NEGATIVE pg/mL
Meperidine Screen, Urine: NEGATIVE ng/mL
Methadone Screen, Urine: NEGATIVE ng/mL
OXYCODONE+OXYMORPHONE UR QL SCN: NEGATIVE ng/mL
Opiate Scrn, Ur: NEGATIVE ng/mL
Ph of Urine: 7.5 (ref 4.5–8.9)
Phencyclidine Qn, Ur: NEGATIVE ng/mL
Propoxyphene Scrn, Ur: NEGATIVE ng/mL
SPECIFIC GRAVITY: 1.011
Tramadol Screen, Urine: NEGATIVE ng/mL

## 2020-06-04 LAB — ABO AND RH: Rh Factor: NEGATIVE

## 2020-06-04 LAB — VARICELLA ZOSTER ANTIBODY, IGG: Varicella zoster IgG: 277 index (ref >165)

## 2020-06-04 LAB — HIV ANTIBODY (ROUTINE TESTING W REFLEX): HIV Screen 4th Generation wRfx: NONREACTIVE

## 2020-06-04 LAB — AB SCR+ANTIBODY ID: Antibody Screen: POSITIVE — AB

## 2020-06-04 LAB — RPR: RPR Ser Ql: NONREACTIVE

## 2020-06-04 LAB — RUBELLA SCREEN: Rubella Antibodies, IGG: 0.95 index — ABNORMAL LOW (ref >0.99)

## 2020-06-04 LAB — ANTIBODY SCREEN

## 2020-06-04 LAB — URINE CULTURE, OB REFLEX

## 2020-06-04 LAB — CULTURE, OB URINE

## 2020-06-04 LAB — HEPATITIS B SURFACE ANTIGEN: Hepatitis B Surface Ag: NEGATIVE

## 2020-06-19 DIAGNOSIS — D2271 Melanocytic nevi of right lower limb, including hip: Secondary | ICD-10-CM | POA: Diagnosis not present

## 2020-06-19 DIAGNOSIS — D2261 Melanocytic nevi of right upper limb, including shoulder: Secondary | ICD-10-CM | POA: Diagnosis not present

## 2020-06-19 DIAGNOSIS — Z85828 Personal history of other malignant neoplasm of skin: Secondary | ICD-10-CM | POA: Diagnosis not present

## 2020-06-19 DIAGNOSIS — X32XXXA Exposure to sunlight, initial encounter: Secondary | ICD-10-CM | POA: Diagnosis not present

## 2020-06-19 DIAGNOSIS — L821 Other seborrheic keratosis: Secondary | ICD-10-CM | POA: Diagnosis not present

## 2020-06-19 DIAGNOSIS — L02222 Furuncle of back [any part, except buttock]: Secondary | ICD-10-CM | POA: Diagnosis not present

## 2020-06-19 DIAGNOSIS — D225 Melanocytic nevi of trunk: Secondary | ICD-10-CM | POA: Diagnosis not present

## 2020-06-19 DIAGNOSIS — D2262 Melanocytic nevi of left upper limb, including shoulder: Secondary | ICD-10-CM | POA: Diagnosis not present

## 2020-06-24 ENCOUNTER — Encounter: Payer: Self-pay | Admitting: Obstetrics and Gynecology

## 2020-06-24 ENCOUNTER — Other Ambulatory Visit: Payer: Self-pay

## 2020-06-24 ENCOUNTER — Ambulatory Visit (INDEPENDENT_AMBULATORY_CARE_PROVIDER_SITE_OTHER): Payer: 59 | Admitting: Obstetrics and Gynecology

## 2020-06-24 VITALS — BP 106/71 | HR 82 | Ht 64.0 in | Wt 152.0 lb

## 2020-06-24 DIAGNOSIS — Z8669 Personal history of other diseases of the nervous system and sense organs: Secondary | ICD-10-CM

## 2020-06-24 DIAGNOSIS — Z1379 Encounter for other screening for genetic and chromosomal anomalies: Secondary | ICD-10-CM | POA: Diagnosis not present

## 2020-06-24 DIAGNOSIS — O219 Vomiting of pregnancy, unspecified: Secondary | ICD-10-CM

## 2020-06-24 DIAGNOSIS — Z3A13 13 weeks gestation of pregnancy: Secondary | ICD-10-CM

## 2020-06-24 DIAGNOSIS — O99891 Other specified diseases and conditions complicating pregnancy: Secondary | ICD-10-CM

## 2020-06-24 DIAGNOSIS — Z8632 Personal history of gestational diabetes: Secondary | ICD-10-CM

## 2020-06-24 DIAGNOSIS — O09522 Supervision of elderly multigravida, second trimester: Secondary | ICD-10-CM

## 2020-06-24 DIAGNOSIS — Z3481 Encounter for supervision of other normal pregnancy, first trimester: Secondary | ICD-10-CM | POA: Diagnosis not present

## 2020-06-24 DIAGNOSIS — O34219 Maternal care for unspecified type scar from previous cesarean delivery: Secondary | ICD-10-CM

## 2020-06-24 DIAGNOSIS — O09899 Supervision of other high risk pregnancies, unspecified trimester: Secondary | ICD-10-CM

## 2020-06-24 DIAGNOSIS — Z2839 Other underimmunization status: Secondary | ICD-10-CM

## 2020-06-24 DIAGNOSIS — Z8742 Personal history of other diseases of the female genital tract: Secondary | ICD-10-CM

## 2020-06-24 DIAGNOSIS — Z283 Underimmunization status: Secondary | ICD-10-CM

## 2020-06-24 LAB — POCT URINALYSIS DIPSTICK OB
Bilirubin, UA: NEGATIVE
Glucose, UA: NEGATIVE
Ketones, UA: NEGATIVE
Leukocytes, UA: NEGATIVE
Nitrite, UA: NEGATIVE
POC,PROTEIN,UA: NEGATIVE
Spec Grav, UA: 1.015 (ref 1.010–1.025)
Urobilinogen, UA: 0.2 E.U./dL
pH, UA: 7 (ref 5.0–8.0)

## 2020-06-24 MED ORDER — DOXYLAMINE-PYRIDOXINE 10-10 MG PO TBEC: 2.0000 | DELAYED_RELEASE_TABLET | Freq: Every day | ORAL | 5 refills | Status: DC

## 2020-06-24 NOTE — Progress Notes (Addendum)
OBSTETRIC INITIAL PRENATAL VISIT  Subjective:    Diana Brooks is being seen today for her first obstetrical visit.  This is a planned pregnancy. She is a 39 y.o. G2P1001 female at [redacted]w[redacted]d gestation, Estimated Date of Delivery: 12/30/20 with Patient's last menstrual period was 03/25/2020.,  consistent with 7 week sono. Her obstetrical history is significant for advanced maternal age and previous C-section x 1, and history of gestational diabetes.  Also with a prior history of migraines, endometriosis, and infertility.  Relationship with FOB: spouse, living together. Patient does intend to breast feed. Pregnancy history fully reviewed.    OB History  Gravida Para Term Preterm AB Living  2 1 1  0 0 1  SAB TAB Ectopic Multiple Live Births  0 0 0 0 1    # Outcome Date GA Lbr Len/2nd Weight Sex Delivery Anes PTL Lv  2 Current           1 Term 2013   8 lb 1.6 oz (3.674 kg) M CS-LTranv   LIV    Gynecologic History:  Last pap smear was 05/15/2019.  Results were normal.  Denies h/o abnormal pap smears in the past.  Denies history of STIs.  Contraception: None. Has a history of infertility and endometriosis.     Past Medical History:  Diagnosis Date  . Anemia   . Anxiety   . Asthma    exercise induced  . Complication of anesthesia   . Dysmenorrhea   . Endometriosis   . GDM (gestational diabetes mellitus)   . Hyperprolactinemia ([place])   . IBS (irritable bowel syndrome)   . Infertility, female   . PONV (postoperative nausea and vomiting)    states phenergan helps alot post op n/v    Family History  Problem Relation Age of Onset  . Endometriosis Mother   . Rheum arthritis Mother   . Diabetes Paternal Grandfather   . Prostate cancer Maternal Uncle   . Healthy Father   . Heart disease Neg Hx   . Breast cancer Neg Hx   . Ovarian cancer Neg Hx   . Colon cancer Neg Hx     Past Surgical History:  Procedure Laterality Date  . CESAREAN SECTION    . CHROMOPERTUBATION N/A  01/11/2016   Procedure: CHROMOPERTUBATION OF FALLOPIAN TUBES;  Surgeon: Brayton Mars, MD;  Location: [facility];  Service: Gynecology;  Laterality: N/A;  . LAPAROSCOPY     lap with excision of fulguration of endometriosis  . LAPAROSCOPY N/A 01/11/2016   Procedure: LAPAROSCOPY DIAGNOSTIC/ WITH EXCISION AND FULGERATION OF ENDOMETRIOSIS; MYOMECTOMY;  Surgeon: Brayton Mars, MD;  Location: [facility];  Service: Gynecology;  Laterality: N/A;  . wrist srugery     ganglion cyst removed    Social History   Socioeconomic History  . Marital status: Married    Spouse name: Not on file  . Number of children: Not on file  . Years of education: Not on file  . Highest education level: Not on file  Occupational History  . Not on file  Tobacco Use  . Smoking status: Never Smoker  . Smokeless tobacco: Never Used  Vaping Use  . Vaping Use: Never used  Substance and Sexual Activity  . Alcohol use: Not Currently    Comment: rare  . Drug use: No  . Sexual activity: Yes    Birth control/protection: None  Other Topics Concern  . Not on file  Social History Narrative  . Not on file  Social Determinants of Health   Financial Resource Strain:   . Difficulty of Paying Living Expenses:   Food Insecurity:   . Worried About Charity fundraiser in the Last Year:   . Arboriculturist in the Last Year:   Transportation Needs:   . Film/video editor (Medical):   Marland Kitchen Lack of Transportation (Non-Medical):   Physical Activity:   . Days of Exercise per Week:   . Minutes of Exercise per Session:   Stress:   . Feeling of Stress :   Social Connections:   . Frequency of Communication with Friends and Family:   . Frequency of Social Gatherings with Friends and Family:   . Attends Religious Services:   . Active Member of Clubs or Organizations:   . Attends Archivist Meetings:   Marland Kitchen Marital Status:   Intimate Partner Violence:   . Fear of Current or Ex-Partner:   . Emotionally  Abused:   Marland Kitchen Physically Abused:   . Sexually Abused:     Current Outpatient Medications on File Prior to Visit  Medication Sig Dispense Refill  . acetaminophen (TYLENOL) 500 MG tablet Take 500 mg by mouth every 6 (six) hours as needed.    Marland Kitchen albuterol (VENTOLIN HFA) 108 (90 Base) MCG/ACT inhaler Inhale 1-2 puffs into the lungs every 6 (six) hours as needed for wheezing or shortness of breath. 6.7 g 1  . Prenatal Vit-Fe Fumarate-FA (MULTIVITAMIN-PRENATAL) 27-0.8 MG TABS tablet Take 1 tablet by mouth daily at 12 noon.    . Cholecalciferol (VITAMIN D HIGH POTENCY PO) Take 5,000 Units by mouth daily. (Patient not taking: Reported on 06/24/2020)     No current facility-administered medications on file prior to visit.    Allergies  Allergen Reactions  . Codeine Nausea And Vomiting  . Levaquin [Levofloxacin In D5w]   . Percocet [Oxycodone-Acetaminophen] Nausea And Vomiting  . Sulfa Antibiotics   . Suprax [Cefixime]   . Vicodin [Hydrocodone-Acetaminophen] Nausea And Vomiting     Review of Systems General: Not Present- Fever, Weight Loss and Weight Gain. Skin: Not Present- Rash. HEENT: Not Present- Blurred Vision, Headache and Bleeding Gums. Respiratory: Not Present- Difficulty Breathing. Breast: Not Present- Breast Mass. Cardiovascular: Not Present- Chest Pain, Elevated Blood Pressure, Fainting / Blacking Out and Shortness of Breath. Gastrointestinal: Not Present- Abdominal Pain, Constipation.  Present- Nausea and Vomiting (trying to modify diet and using ginger). Female Genitourinary: Not Present- Frequency, Painful Urination, Pelvic Pain, Vaginal Bleeding, Vaginal Discharge, Contractions, regular, Fetal Movements Decreased, Urinary Complaints and Vaginal Fluid. Musculoskeletal: Not Present- Back Pain and Leg Cramps. Neurological: Not Present- Dizziness. Psychiatric: Not Present- Depression.     Objective:   Blood pressure 106/71, pulse 82, weight 152 lb (68.9 kg), last menstrual  period 03/25/2020.  Body mass index is 26.09 kg/m.  General Appearance:    Alert, cooperative, no distress, appears stated age  Head:    Normocephalic, without obvious abnormality, atraumatic  Eyes:    PERRL, conjunctiva/corneas clear, EOM's intact, both eyes  Ears:    Normal external ear canals, both ears  Nose:   Nares normal, septum midline, mucosa normal, no drainage or sinus tenderness  Throat:   Lips, mucosa, and tongue normal; teeth and gums normal  Neck:   Supple, symmetrical, trachea midline, no adenopathy; thyroid: no enlargement/tenderness/nodules; no carotid bruit or JVD  Back:     Symmetric, no curvature, ROM normal, no CVA tenderness  Lungs:     Clear to auscultation bilaterally, respirations unlabored  Chest Wall:    No tenderness or deformity   Heart:    Regular rate and rhythm, S1 and S2 normal, no murmur, rub or gallop  Breast Exam:    No tenderness, masses, or nipple abnormality  Abdomen:     Soft, non-tender, bowel sounds active all four quadrants, no masses, no organomegaly.  FHT 144 bpm.  Well-healed Pfannenstiel incision.   Genitalia:    Pelvic:external genitalia normal, vagina without lesions, discharge, or tenderness, rectovaginal septum  normal. Cervix normal in appearance, no cervical motion tenderness, no adnexal masses or tenderness.  Pregnancy positive findings: uterine enlargement: 13 wk size, nontender.   Rectal:    Normal external sphincter.  No hemorrhoids appreciated. Internal exam not done.   Extremities:   Extremities normal, atraumatic, no cyanosis or edema  Pulses:   2+ and symmetric all extremities  Skin:   Skin color, texture, turgor normal, no rashes or lesions  Lymph nodes:   Cervical, supraclavicular, and axillary nodes normal  Neurologic:   CNII-XII intact, normal strength, sensation and reflexes throughout      Assessment:   1. Supervision of elderly multigravida (>=47 years old at time of delivery), second trimester   2. [redacted] weeks gestation  of pregnancy   3. Genetic screening   4. History of gestational diabetes   5. History of cesarean delivery affecting pregnancy   6. Hx of migraines   7. Rubella non-immune status, antepartum   8. Nausea and vomiting during pregnancy prior to [redacted] weeks gestation   9. History of endometriosis     Plan:   Supervision of elderly multigravida (>=41 years old at time of delivery), second trimester  - Initial labs reviewed. For CBC today. Patient is rubella non-immune. Will need vaccination postpartum.  - Prenatal vitamins encouraged. - Problem list reviewed and updated. - New OB counseling:  The patient has been given an overview regarding routine prenatal care.  Recommendations regarding diet, weight gain, and exercise in pregnancy were given. - Prenatal testing, optional genetic testing, and ultrasound use in pregnancy were reviewed.  Cell-free DNA testing desired.  Panorama performed today.  - Benefits of Breast Feeding were discussed. The patient is encouraged to consider nursing her baby post partum.  2. History of gestational diabetes  - Patient will need early glucola.  Will schedule for next visit as she is still dealing with nausea/vomiting.  - Would recommend starting a daily baby aspirin due to history of GDM and advanced maternal age.   3. History of cesarean delivery affecting pregnancy  - Discussion with patient regarding option of TOLAC vs repeat C-section.  Last C-section performed for failure to descend.  Patient notes that she would likely refer to have a repeat C-section.   4. History of migraines  - Patient reports a prior history, however rare.  But did have a pretty significant headache ~ 2 weeks ago, but thinks it also may be been due to her nausea/vomiting (not eating or drinking enough).  Has been consuming more electrolyte fluids (I.e. Gatorade).   5. History of endometriosis  - Has been relatively asymptomatic. Will continue to monitor for any change in symptoms  during pregnancy.   6. Nausea and vomiting of pregnancy - Prescribed Diclegis as she is still experiencing nausea and vomiting. Will prescribe Diclegis.   Follow up in 4 weeks.   Rubie Maid, MD [facility] 06/27/2020 4:30 PM

## 2020-06-24 NOTE — Progress Notes (Signed)
NOB-Pt present for NOB PE. Pt c/o right and left side pain off and on. Natera completed today. Hx of gest. Diabetes in first pregnancy.

## 2020-06-25 LAB — CBC
Hematocrit: 36.6 % (ref 34.0–46.6)
Hemoglobin: 12.2 g/dL (ref 11.1–15.9)
MCH: 31.2 pg (ref 26.6–33.0)
MCHC: 33.3 g/dL (ref 31.5–35.7)
MCV: 94 fL (ref 79–97)
Platelets: 205 10*3/uL (ref 150–450)
RBC: 3.91 x10E6/uL (ref 3.77–5.28)
RDW: 13.2 % (ref 11.7–15.4)
WBC: 8.6 10*3/uL (ref 3.4–10.8)

## 2020-06-27 ENCOUNTER — Encounter: Payer: Self-pay | Admitting: Obstetrics and Gynecology

## 2020-06-27 DIAGNOSIS — O34219 Maternal care for unspecified type scar from previous cesarean delivery: Secondary | ICD-10-CM | POA: Insufficient documentation

## 2020-06-27 DIAGNOSIS — Z8669 Personal history of other diseases of the nervous system and sense organs: Secondary | ICD-10-CM | POA: Insufficient documentation

## 2020-06-27 DIAGNOSIS — O09899 Supervision of other high risk pregnancies, unspecified trimester: Secondary | ICD-10-CM | POA: Insufficient documentation

## 2020-06-27 DIAGNOSIS — Z2839 Other underimmunization status: Secondary | ICD-10-CM | POA: Insufficient documentation

## 2020-06-27 DIAGNOSIS — Z8742 Personal history of other diseases of the female genital tract: Secondary | ICD-10-CM | POA: Insufficient documentation

## 2020-06-27 MED ORDER — ASPIRIN EC 81 MG PO TBEC
81.0000 mg | DELAYED_RELEASE_TABLET | Freq: Every day | ORAL | 2 refills | Status: DC
Start: 2020-06-27 — End: 2020-12-26

## 2020-06-27 NOTE — Patient Instructions (Signed)

## 2020-06-30 DIAGNOSIS — H5203 Hypermetropia, bilateral: Secondary | ICD-10-CM | POA: Diagnosis not present

## 2020-07-22 ENCOUNTER — Other Ambulatory Visit: Payer: Self-pay

## 2020-07-22 ENCOUNTER — Ambulatory Visit (INDEPENDENT_AMBULATORY_CARE_PROVIDER_SITE_OTHER): Payer: 59 | Admitting: Obstetrics and Gynecology

## 2020-07-22 ENCOUNTER — Encounter: Payer: Self-pay | Admitting: Obstetrics and Gynecology

## 2020-07-22 VITALS — BP 120/74 | HR 99 | Wt 157.6 lb

## 2020-07-22 DIAGNOSIS — O09522 Supervision of elderly multigravida, second trimester: Secondary | ICD-10-CM

## 2020-07-22 DIAGNOSIS — Z3A17 17 weeks gestation of pregnancy: Secondary | ICD-10-CM | POA: Diagnosis not present

## 2020-07-22 LAB — POCT URINALYSIS DIPSTICK OB
Bilirubin, UA: NEGATIVE
Blood, UA: NEGATIVE
Glucose, UA: NEGATIVE
Ketones, UA: NEGATIVE
Leukocytes, UA: NEGATIVE
Nitrite, UA: NEGATIVE
POC,PROTEIN,UA: NEGATIVE
Spec Grav, UA: 1.015 (ref 1.010–1.025)
Urobilinogen, UA: 0.2 E.U./dL
pH, UA: 7 (ref 5.0–8.0)

## 2020-07-22 NOTE — Addendum Note (Signed)
Addended by: Durwin Glaze on: 07/22/2020 10:19 AM   Modules accepted: Orders

## 2020-07-22 NOTE — Progress Notes (Signed)
ROB: Patient describes an occasional increase in discharge.  Reports no itching burning or odor.  If her discharge continues or becomes symptomatic consider work-up evaluation for BV or yeast.  Patient desires AFP today.  Taking vitamins as directed.  Patient says she no longer has any nausea or vomiting but is taking Diclegis regularly.  We have discussed the possibility of gradually weaning off Diclegis.  FAS scheduled next visit.

## 2020-07-24 LAB — AFP, SERUM, OPEN SPINA BIFIDA
AFP MoM: 1.1
AFP Value: 41.8 ng/mL
Gest. Age on Collection Date: 17 weeks
Maternal Age At EDD: 37.5 yr
OSBR Risk 1 IN: 8933
Test Results:: NEGATIVE
Weight: 157 [lb_av]

## 2020-08-25 ENCOUNTER — Ambulatory Visit (INDEPENDENT_AMBULATORY_CARE_PROVIDER_SITE_OTHER): Payer: 59

## 2020-08-25 ENCOUNTER — Other Ambulatory Visit: Payer: Self-pay

## 2020-08-25 ENCOUNTER — Encounter: Payer: Self-pay | Admitting: Obstetrics and Gynecology

## 2020-08-25 ENCOUNTER — Ambulatory Visit (INDEPENDENT_AMBULATORY_CARE_PROVIDER_SITE_OTHER): Payer: 59 | Admitting: Obstetrics and Gynecology

## 2020-08-25 VITALS — BP 109/71 | HR 89 | Wt 160.5 lb

## 2020-08-25 DIAGNOSIS — Z3A17 17 weeks gestation of pregnancy: Secondary | ICD-10-CM | POA: Diagnosis not present

## 2020-08-25 DIAGNOSIS — Z7185 Encounter for immunization safety counseling: Secondary | ICD-10-CM

## 2020-08-25 DIAGNOSIS — Z8632 Personal history of gestational diabetes: Secondary | ICD-10-CM

## 2020-08-25 DIAGNOSIS — Z3A21 21 weeks gestation of pregnancy: Secondary | ICD-10-CM

## 2020-08-25 DIAGNOSIS — O34219 Maternal care for unspecified type scar from previous cesarean delivery: Secondary | ICD-10-CM

## 2020-08-25 DIAGNOSIS — Z36 Encounter for antenatal screening for chromosomal anomalies: Secondary | ICD-10-CM | POA: Diagnosis not present

## 2020-08-25 DIAGNOSIS — O09522 Supervision of elderly multigravida, second trimester: Secondary | ICD-10-CM

## 2020-08-25 LAB — POCT URINALYSIS DIPSTICK OB
Bilirubin, UA: NEGATIVE
Blood, UA: NEGATIVE
Glucose, UA: NEGATIVE
Ketones, UA: NEGATIVE
Leukocytes, UA: NEGATIVE
Nitrite, UA: NEGATIVE
POC,PROTEIN,UA: NEGATIVE
Spec Grav, UA: <=1.005 — AB (ref 1.010–1.025)
Urobilinogen, UA: 0.2 E.U./dL
pH, UA: 7 (ref 5.0–8.0)

## 2020-08-25 NOTE — Progress Notes (Signed)
ROB-Pt present for routine prenatal care. Pt stated that she was concerned about not feeling her baby as much as she thought she would and questioning if she was to have an earlier glucose due to having gestational diabetics during her last pregnancy.

## 2020-08-25 NOTE — Patient Instructions (Signed)

## 2020-08-26 DIAGNOSIS — M9903 Segmental and somatic dysfunction of lumbar region: Secondary | ICD-10-CM | POA: Diagnosis not present

## 2020-08-26 DIAGNOSIS — M9901 Segmental and somatic dysfunction of cervical region: Secondary | ICD-10-CM | POA: Diagnosis not present

## 2020-08-26 DIAGNOSIS — M4603 Spinal enthesopathy, cervicothoracic region: Secondary | ICD-10-CM | POA: Diagnosis not present

## 2020-08-26 DIAGNOSIS — M9904 Segmental and somatic dysfunction of sacral region: Secondary | ICD-10-CM | POA: Diagnosis not present

## 2020-08-26 DIAGNOSIS — M9902 Segmental and somatic dysfunction of thoracic region: Secondary | ICD-10-CM | POA: Diagnosis not present

## 2020-08-26 DIAGNOSIS — M4607 Spinal enthesopathy, lumbosacral region: Secondary | ICD-10-CM | POA: Diagnosis not present

## 2020-08-27 NOTE — Progress Notes (Signed)
ROB: Patient doing well today. Has questions regarding glucose tolerance test. Notes that she has not been feeling baby moving as much and is concerned as she has not done an early glucose test and had GDM (diet controlled) in last pregnancy.  Informed that we would be screening by her next visit, and that her early A1c was normal so there was less concern for early diabetes.  Patient also has a question regarding flu vaccine.  Wonders when is the best time to get it. Reviewed all questions with patient.  Notes that she may hold off on flu vaccine, may get at her job later into the flu season. Lastly, patient has questions regarding TOLAC vs repeat C-section. Was initially planning on having repeat C-section, but has concerns due to significant peri-operative nausea/vomiting, and having anxiety about being strapped to the operating table. Notes last pregnancy she had GDM, which led to an induction. Reports having to have a C-section for fetal distress.  Also concerned about size of baby, notes ultrasound noted last pregnancy weight of 8 lbs, but baby was almost 9 lbs. Is concerned that this pregnancy may be the same and would not like a trial of labor if her baby is measuring large. Discussed risks and benefits of TOLAC vs repeat C-section.  Patient notes she will think about all information.  Reviewed ultrasound.  Normal anatomy today. RTC in 4 weeks, for glucola.

## 2020-09-14 DIAGNOSIS — M9903 Segmental and somatic dysfunction of lumbar region: Secondary | ICD-10-CM | POA: Diagnosis not present

## 2020-09-14 DIAGNOSIS — M9904 Segmental and somatic dysfunction of sacral region: Secondary | ICD-10-CM | POA: Diagnosis not present

## 2020-09-14 DIAGNOSIS — M9902 Segmental and somatic dysfunction of thoracic region: Secondary | ICD-10-CM | POA: Diagnosis not present

## 2020-09-14 DIAGNOSIS — M4607 Spinal enthesopathy, lumbosacral region: Secondary | ICD-10-CM | POA: Diagnosis not present

## 2020-09-14 DIAGNOSIS — M9901 Segmental and somatic dysfunction of cervical region: Secondary | ICD-10-CM | POA: Diagnosis not present

## 2020-09-14 DIAGNOSIS — M4603 Spinal enthesopathy, cervicothoracic region: Secondary | ICD-10-CM | POA: Diagnosis not present

## 2020-09-23 ENCOUNTER — Ambulatory Visit (INDEPENDENT_AMBULATORY_CARE_PROVIDER_SITE_OTHER): Payer: 59 | Admitting: Obstetrics and Gynecology

## 2020-09-23 ENCOUNTER — Encounter: Payer: Self-pay | Admitting: Obstetrics and Gynecology

## 2020-09-23 ENCOUNTER — Other Ambulatory Visit: Payer: 59

## 2020-09-23 ENCOUNTER — Other Ambulatory Visit: Payer: Self-pay

## 2020-09-23 VITALS — BP 113/71 | HR 96 | Wt 168.9 lb

## 2020-09-23 DIAGNOSIS — Z8632 Personal history of gestational diabetes: Secondary | ICD-10-CM

## 2020-09-23 DIAGNOSIS — O09522 Supervision of elderly multigravida, second trimester: Secondary | ICD-10-CM

## 2020-09-23 DIAGNOSIS — Z23 Encounter for immunization: Secondary | ICD-10-CM | POA: Diagnosis not present

## 2020-09-23 DIAGNOSIS — Z3A26 26 weeks gestation of pregnancy: Secondary | ICD-10-CM | POA: Diagnosis not present

## 2020-09-23 DIAGNOSIS — Z0289 Encounter for other administrative examinations: Secondary | ICD-10-CM

## 2020-09-23 NOTE — Addendum Note (Signed)
Addended by: Durwin Glaze on: 09/23/2020 10:03 AM   Modules accepted: Orders

## 2020-09-23 NOTE — Progress Notes (Signed)
ROB: Occasional constipation.  Discussed use of fiber laxatives.  Patient otherwise doing well.  Says she is still leaning toward repeat cesarean delivery.  1 hour GCT today.  All questions answered regarding TOLAC versus repeat CD.

## 2020-09-24 LAB — CBC
Hematocrit: 31.1 % — ABNORMAL LOW (ref 34.0–46.6)
Hemoglobin: 10.4 g/dL — ABNORMAL LOW (ref 11.1–15.9)
MCH: 31 pg (ref 26.6–33.0)
MCHC: 33.4 g/dL (ref 31.5–35.7)
MCV: 93 fL (ref 79–97)
Platelets: 184 10*3/uL (ref 150–450)
RBC: 3.36 x10E6/uL — ABNORMAL LOW (ref 3.77–5.28)
RDW: 12.8 % (ref 11.7–15.4)
WBC: 7.5 10*3/uL (ref 3.4–10.8)

## 2020-09-24 LAB — RPR: RPR Ser Ql: NONREACTIVE

## 2020-09-24 LAB — GLUCOSE, 1 HOUR GESTATIONAL: Gestational Diabetes Screen: 138 mg/dL (ref 65–139)

## 2020-09-30 ENCOUNTER — Other Ambulatory Visit: Payer: Self-pay

## 2020-10-01 ENCOUNTER — Other Ambulatory Visit: Payer: Self-pay

## 2020-10-02 ENCOUNTER — Other Ambulatory Visit: Payer: Self-pay

## 2020-10-02 MED ORDER — CONTOUR NEXT TEST VI STRP: ORAL_STRIP | 12 refills | Status: DC

## 2020-10-07 DIAGNOSIS — M4603 Spinal enthesopathy, cervicothoracic region: Secondary | ICD-10-CM | POA: Diagnosis not present

## 2020-10-07 DIAGNOSIS — M9902 Segmental and somatic dysfunction of thoracic region: Secondary | ICD-10-CM | POA: Diagnosis not present

## 2020-10-07 DIAGNOSIS — M9903 Segmental and somatic dysfunction of lumbar region: Secondary | ICD-10-CM | POA: Diagnosis not present

## 2020-10-07 DIAGNOSIS — M4607 Spinal enthesopathy, lumbosacral region: Secondary | ICD-10-CM | POA: Diagnosis not present

## 2020-10-07 DIAGNOSIS — M9904 Segmental and somatic dysfunction of sacral region: Secondary | ICD-10-CM | POA: Diagnosis not present

## 2020-10-07 DIAGNOSIS — M9901 Segmental and somatic dysfunction of cervical region: Secondary | ICD-10-CM | POA: Diagnosis not present

## 2020-10-13 ENCOUNTER — Ambulatory Visit (INDEPENDENT_AMBULATORY_CARE_PROVIDER_SITE_OTHER): Payer: 59 | Admitting: Obstetrics and Gynecology

## 2020-10-13 ENCOUNTER — Encounter: Payer: Self-pay | Admitting: Obstetrics and Gynecology

## 2020-10-13 ENCOUNTER — Other Ambulatory Visit: Payer: Self-pay

## 2020-10-13 VITALS — BP 93/55 | HR 96 | Wt 169.1 lb

## 2020-10-13 DIAGNOSIS — Z8632 Personal history of gestational diabetes: Secondary | ICD-10-CM

## 2020-10-13 DIAGNOSIS — Z3483 Encounter for supervision of other normal pregnancy, third trimester: Secondary | ICD-10-CM

## 2020-10-13 DIAGNOSIS — Z3A28 28 weeks gestation of pregnancy: Secondary | ICD-10-CM

## 2020-10-13 LAB — POCT URINALYSIS DIPSTICK OB
Bilirubin, UA: NEGATIVE
Blood, UA: NEGATIVE
Glucose, UA: NEGATIVE
Ketones, UA: NEGATIVE
Leukocytes, UA: NEGATIVE
Nitrite, UA: NEGATIVE
POC,PROTEIN,UA: NEGATIVE
Spec Grav, UA: <=1.005 — AB (ref 1.010–1.025)
Urobilinogen, UA: 0.2 E.U./dL
pH, UA: 6.5 (ref 5.0–8.0)

## 2020-10-13 NOTE — Patient Instructions (Signed)
Third Trimester of Pregnancy The third trimester is from week 28 through week 40 (months 7 through 9). The third trimester is a time when the unborn baby (fetus) is growing rapidly. At the end of the ninth month, the fetus is about 20 inches in length and weighs 6-10 pounds. Body changes during your third trimester Your body will continue to go through many changes during pregnancy. The changes vary from woman to woman. During the third trimester:  Your weight will continue to increase. You can expect to gain 25-35 pounds (11-16 kg) by the end of the pregnancy.  You may begin to get stretch marks on your hips, abdomen, and breasts.  You may urinate more often because the fetus is moving lower into your pelvis and pressing on your bladder.  You may develop or continue to have heartburn. This is caused by increased hormones that slow down muscles in the digestive tract.  You may develop or continue to have constipation because increased hormones slow digestion and cause the muscles that push waste through your intestines to relax.  You may develop hemorrhoids. These are swollen veins (varicose veins) in the rectum that can itch or be painful.  You may develop swollen, bulging veins (varicose veins) in your legs.  You may have increased body aches in the pelvis, back, or thighs. This is due to weight gain and increased hormones that are relaxing your joints.  You may have changes in your hair. These can include thickening of your hair, rapid growth, and changes in texture. Some women also have hair loss during or after pregnancy, or hair that feels dry or thin. Your hair will most likely return to normal after your baby is born.  Your breasts will continue to grow and they will continue to become tender. A yellow fluid (colostrum) may leak from your breasts. This is the first milk you are producing for your baby.  Your belly button may stick out.  You may notice more swelling in your hands,  face, or ankles.  You may have increased tingling or numbness in your hands, arms, and legs. The skin on your belly may also feel numb.  You may feel short of breath because of your expanding uterus.  You may have more problems sleeping. This can be caused by the size of your belly, increased need to urinate, and an increase in your body's metabolism.  You may notice the fetus "dropping," or moving lower in your abdomen (lightening).  You may have increased vaginal discharge.  You may notice your joints feel loose and you may have pain around your pelvic bone. What to expect at prenatal visits You will have prenatal exams every 2 weeks until week 36. Then you will have weekly prenatal exams. During a routine prenatal visit:  You will be weighed to make sure you and the baby are growing normally.  Your blood pressure will be taken.  Your abdomen will be measured to track your baby's growth.  The fetal heartbeat will be listened to.  Any test results from the previous visit will be discussed.  You may have a cervical check near your due date to see if your cervix has softened or thinned (effaced).  You will be tested for Group B streptococcus. This happens between 35 and 37 weeks. Your health care provider may ask you:  What your birth plan is.  How you are feeling.  If you are feeling the baby move.  If you have had any abnormal   symptoms, such as leaking fluid, bleeding, severe headaches, or abdominal cramping.  If you are using any tobacco products, including cigarettes, chewing tobacco, and electronic cigarettes.  If you have any questions. Other tests or screenings that may be performed during your third trimester include:  Blood tests that check for low iron levels (anemia).  Fetal testing to check the health, activity level, and growth of the fetus. Testing is done if you have certain medical conditions or if there are problems during the pregnancy.  Nonstress test  (NST). This test checks the health of your baby to make sure there are no signs of problems, such as the baby not getting enough oxygen. During this test, a belt is placed around your belly. The baby is made to move, and its heart rate is monitored during movement. What is false labor? False labor is a condition in which you feel small, irregular tightenings of the muscles in the womb (contractions) that usually go away with rest, changing position, or drinking water. These are called Braxton Hicks contractions. Contractions may last for hours, days, or even weeks before true labor sets in. If contractions come at regular intervals, become more frequent, increase in intensity, or become painful, you should see your health care provider. What are the signs of labor?  Abdominal cramps.  Regular contractions that start at 10 minutes apart and become stronger and more frequent with time.  Contractions that start on the top of the uterus and spread down to the lower abdomen and back.  Increased pelvic pressure and dull back pain.  A watery or bloody mucus discharge that comes from the vagina.  Leaking of amniotic fluid. This is also known as your "water breaking." It could be a slow trickle or a gush. Let your health care provider know if it has a color or strange odor. If you have any of these signs, call your health care provider right away, even if it is before your due date. Follow these instructions at home: Medicines  Follow your health care provider's instructions regarding medicine use. Specific medicines may be either safe or unsafe to take during pregnancy.  Take a prenatal vitamin that contains at least 600 micrograms (mcg) of folic acid.  If you develop constipation, try taking a stool softener if your health care provider approves. Eating and drinking   Eat a balanced diet that includes fresh fruits and vegetables, whole grains, good sources of protein such as meat, eggs, or tofu,  and low-fat dairy. Your health care provider will help you determine the amount of weight gain that is right for you.  Avoid raw meat and uncooked cheese. These carry germs that can cause birth defects in the baby.  If you have low calcium intake from food, talk to your health care provider about whether you should take a daily calcium supplement.  Eat four or five small meals rather than three large meals a day.  Limit foods that are high in fat and processed sugars, such as fried and sweet foods.  To prevent constipation: ? Drink enough fluid to keep your urine clear or pale yellow. ? Eat foods that are high in fiber, such as fresh fruits and vegetables, whole grains, and beans. Activity  Exercise only as directed by your health care provider. Most women can continue their usual exercise routine during pregnancy. Try to exercise for 30 minutes at least 5 days a week. Stop exercising if you experience uterine contractions.  Avoid heavy lifting.  Do   not exercise in extreme heat or humidity, or at high altitudes.  Wear low-heel, comfortable shoes.  Practice good posture.  You may continue to have sex unless your health care provider tells you otherwise. Relieving pain and discomfort  Take frequent breaks and rest with your legs elevated if you have leg cramps or low back pain.  Take warm sitz baths to soothe any pain or discomfort caused by hemorrhoids. Use hemorrhoid cream if your health care provider approves.  Wear a good support bra to prevent discomfort from breast tenderness.  If you develop varicose veins: ? Wear support pantyhose or compression stockings as told by your healthcare provider. ? Elevate your feet for 15 minutes, 3-4 times a day. Prenatal care  Write down your questions. Take them to your prenatal visits.  Keep all your prenatal visits as told by your health care provider. This is important. Safety  Wear your seat belt at all times when driving.  Make  a list of emergency phone numbers, including numbers for family, friends, the hospital, and police and fire departments. General instructions  Avoid cat litter boxes and soil used by cats. These carry germs that can cause birth defects in the baby. If you have a cat, ask someone to clean the litter box for you.  Do not travel far distances unless it is absolutely necessary and only with the approval of your health care provider.  Do not use hot tubs, steam rooms, or saunas.  Do not drink alcohol.  Do not use any products that contain nicotine or tobacco, such as cigarettes and e-cigarettes. If you need help quitting, ask your health care provider.  Do not use any medicinal herbs or unprescribed drugs. These chemicals affect the formation and growth of the baby.  Do not douche or use tampons or scented sanitary pads.  Do not cross your legs for long periods of time.  To prepare for the arrival of your baby: ? Take prenatal classes to understand, practice, and ask questions about labor and delivery. ? Make a trial run to the hospital. ? Visit the hospital and tour the maternity area. ? Arrange for maternity or paternity leave through employers. ? Arrange for family and friends to take care of pets while you are in the hospital. ? Purchase a rear-facing car seat and make sure you know how to install it in your car. ? Pack your hospital bag. ? Prepare the baby's nursery. Make sure to remove all pillows and stuffed animals from the baby's crib to prevent suffocation.  Visit your dentist if you have not gone during your pregnancy. Use a soft toothbrush to brush your teeth and be gentle when you floss. Contact a health care provider if:  You are unsure if you are in labor or if your water has broken.  You become dizzy.  You have mild pelvic cramps, pelvic pressure, or nagging pain in your abdominal area.  You have lower back pain.  You have persistent nausea, vomiting, or  diarrhea.  You have an unusual or bad smelling vaginal discharge.  You have pain when you urinate. Get help right away if:  Your water breaks before 37 weeks.  You have regular contractions less than 5 minutes apart before 37 weeks.  You have a fever.  You are leaking fluid from your vagina.  You have spotting or bleeding from your vagina.  You have severe abdominal pain or cramping.  You have rapid weight loss or weight gain.  You have   shortness of breath with chest pain.  You notice sudden or extreme swelling of your face, hands, ankles, feet, or legs.  Your baby makes fewer than 10 movements in 2 hours.  You have severe headaches that do not go away when you take medicine.  You have vision changes. Summary  The third trimester is from week 28 through week 40, months 7 through 9. The third trimester is a time when the unborn baby (fetus) is growing rapidly.  During the third trimester, your discomfort may increase as you and your baby continue to gain weight. You may have abdominal, leg, and back pain, sleeping problems, and an increased need to urinate.  During the third trimester your breasts will keep growing and they will continue to become tender. A yellow fluid (colostrum) may leak from your breasts. This is the first milk you are producing for your baby.  False labor is a condition in which you feel small, irregular tightenings of the muscles in the womb (contractions) that eventually go away. These are called Braxton Hicks contractions. Contractions may last for hours, days, or even weeks before true labor sets in.  Signs of labor can include: abdominal cramps; regular contractions that start at 10 minutes apart and become stronger and more frequent with time; watery or bloody mucus discharge that comes from the vagina; increased pelvic pressure and dull back pain; and leaking of amniotic fluid. This information is not intended to replace advice given to you by your  health care provider. Make sure you discuss any questions you have with your health care provider. Document Revised: 02/28/2019 Document Reviewed: 12/13/2016 Elsevier Patient Education  2020 [employer]  

## 2020-10-13 NOTE — Progress Notes (Signed)
ROB-Pt present for routine prenatal care. BTC completed.  Pt stated having lower abd pressure.

## 2020-10-13 NOTE — Progress Notes (Signed)
ROB: patient reports concerns of her blood pressure.  Has been noting elevated blood pressures at home, as well as at work over the past week.  BPs noted to be 150s-160s/90s. Also was not feeling well this morning, seeing spots and having a headache. Did not go to work today and had to lie down prior to her appointment.  Report Reviewed BP today within normal range (actually low normal). Performed manual BP and checked with patient's home cuff, ranges were 120s-130s/60-70s. Discussed precautions with patient, continue to monitor BPs.  Given work notice for today to allow for resting. Encouraged adequate hydration. Had borderline 1 hr glucola, has been monitoring her blood sugars at home and watching her diet. Only notes one elevated level (but had pancakes that morning).  Continue to monitor.    RTC in 2 weeks.

## 2020-10-27 ENCOUNTER — Ambulatory Visit (INDEPENDENT_AMBULATORY_CARE_PROVIDER_SITE_OTHER): Payer: 59 | Admitting: Obstetrics and Gynecology

## 2020-10-27 ENCOUNTER — Other Ambulatory Visit: Payer: Self-pay

## 2020-10-27 ENCOUNTER — Encounter: Payer: Self-pay | Admitting: Obstetrics and Gynecology

## 2020-10-27 VITALS — BP 119/76 | HR 101 | Wt 171.0 lb

## 2020-10-27 DIAGNOSIS — Z3A30 30 weeks gestation of pregnancy: Secondary | ICD-10-CM

## 2020-10-27 DIAGNOSIS — Z3483 Encounter for supervision of other normal pregnancy, third trimester: Secondary | ICD-10-CM

## 2020-10-27 LAB — POCT URINALYSIS DIPSTICK OB
Bilirubin, UA: NEGATIVE
Blood, UA: NEGATIVE
Glucose, UA: NEGATIVE
Ketones, UA: NEGATIVE
Leukocytes, UA: NEGATIVE
Nitrite, UA: NEGATIVE
POC,PROTEIN,UA: NEGATIVE
Spec Grav, UA: 1.01 (ref 1.010–1.025)
Urobilinogen, UA: 0.2 E.U./dL
pH, UA: 6.5 (ref 5.0–8.0)

## 2020-10-27 NOTE — Progress Notes (Signed)
ROB: No complaints today.  Patient doing blood sugars 4 times daily.  Fastings are all good occasional after lunch elevated but sugars generally well controlled.  Daily fetal movement.  Fetal presentation ? Transverse.

## 2020-10-27 NOTE — Patient Instructions (Signed)
Signs and Symptoms of Labor Labor is your body's natural process of moving your baby, placenta, and umbilical cord out of your uterus. The process of labor usually starts when your baby is full-term, between 37 and 40 weeks of pregnancy. How will I know when I am close to going into labor? As your body prepares for labor and the birth of your baby, you may notice the following symptoms in the weeks and days before true labor starts:  Having a strong desire to get your home ready to receive your new baby. This is called nesting. Nesting may be a sign that labor is approaching, and it may occur several weeks before birth. Nesting may involve cleaning and organizing your home.  Passing a small amount of thick, bloody mucus out of your vagina (normal bloody show or losing your mucus plug). This may happen more than a week before labor begins, or it might occur right before labor begins as the opening of the cervix starts to widen (dilate). For some women, the entire mucus plug passes at once. For others, smaller portions of the mucus plug may gradually pass over several days.  Your baby moving (dropping) lower in your pelvis to get into position for birth (lightening). When this happens, you may feel more pressure on your bladder and pelvic bone and less pressure on your ribs. This may make it easier to breathe. It may also cause you to need to urinate more often and have problems with bowel movements.  Having "practice contractions" (Braxton Hicks contractions) that occur at irregular (unevenly spaced) intervals that are more than 10 minutes apart. This is also called false labor. False labor contractions are common after exercise or sexual activity, and they will stop if you change position, rest, or drink fluids. These contractions are usually mild and do not get stronger over time. They may feel like: ? A backache or back pain. ? Mild cramps, similar to menstrual cramps. ? Tightening or pressure in  your abdomen. Other early symptoms that labor may be starting soon include:  Nausea or loss of appetite.  Diarrhea.  Having a sudden burst of energy, or feeling very tired.  Mood changes.  Having trouble sleeping. How will I know when labor has begun? Signs that true labor has begun may include:  Having contractions that come at regular (evenly spaced) intervals and increase in intensity. This may feel like more intense tightening or pressure in your abdomen that moves to your back. ? Contractions may also feel like rhythmic pain in your upper thighs or back that comes and goes at regular intervals. ? For first-time mothers, this change in intensity of contractions often occurs at a more gradual pace. ? Women who have given birth before may notice a more rapid progression of contraction changes.  Having a feeling of pressure in the vaginal area.  Your water breaking (rupture of membranes). This is when the sac of fluid that surrounds your baby breaks. When this happens, you will notice fluid leaking from your vagina. This may be clear or blood-tinged. Labor usually starts within 24 hours of your water breaking, but it may take longer to begin. ? Some women notice this as a gush of fluid. ? Others notice that their underwear repeatedly becomes damp. Follow these instructions at home:   When labor starts, or if your water breaks, call your health care provider or nurse care line. Based on your situation, they will determine when you should go in for an   exam.  When you are in early labor, you may be able to rest and manage symptoms at home. Some strategies to try at home include: ? Breathing and relaxation techniques. ? Taking a warm bath or shower. ? Listening to music. ? Using a heating pad on the lower back for pain. If you are directed to use heat:  Place a towel between your skin and the heat source.  Leave the heat on for 20-30 minutes.  Remove the heat if your skin turns  bright red. This is especially important if you are unable to feel pain, heat, or cold. You may have a greater risk of getting burned. Get help right away if:  You have painful, regular contractions that are 5 minutes apart or less.  Labor starts before you are [redacted] weeks along in your pregnancy.  You have a fever.  You have a headache that does not go away.  You have bright red blood coming from your vagina.  You do not feel your baby moving.  You have a sudden onset of: ? Severe headache with vision problems. ? Nausea, vomiting, or diarrhea. ? Chest pain or shortness of breath. These symptoms may be an emergency. If your health care provider recommends that you go to the hospital or birth center where you plan to deliver, do not drive yourself. Have someone else drive you, or call emergency services (911 in the [place] Summary  Labor is your body's natural process of moving your baby, placenta, and umbilical cord out of your uterus.  The process of labor usually starts when your baby is full-term, between 24 and 40 weeks of pregnancy.  When labor starts, or if your water breaks, call your health care provider or nurse care line. Based on your situation, they will determine when you should go in for an exam. This information is not intended to replace advice given to you by your health care provider. Make sure you discuss any questions you have with your health care provider. Document Revised: 08/07/2017 Document Reviewed: 04/14/2017 Elsevier Patient Education  [place] of Pregnancy  The third trimester is from week 28 through week 40 (months 7 through 9). This trimester is when your unborn baby (fetus) is growing very fast. At the end of the ninth month, the unborn baby is about 20 inches in length. It weighs about 6-10 pounds. Follow these instructions at home: Medicines  Take over-the-counter and prescription medicines only as told by your doctor.  Some medicines are safe and some medicines are not safe during pregnancy.  Take a prenatal vitamin that contains at least 600 micrograms (mcg) of folic acid.  If you have trouble pooping (constipation), take medicine that will make your stool soft (stool softener) if your doctor approves. Eating and drinking   Eat regular, healthy meals.  Avoid raw meat and uncooked cheese.  If you get low calcium from the food you eat, talk to your doctor about taking a daily calcium supplement.  Eat four or five small meals rather than three large meals a day.  Avoid foods that are high in fat and sugars, such as fried and sweet foods.  To prevent constipation: ? Eat foods that are high in fiber, like fresh fruits and vegetables, whole grains, and beans. ? Drink enough fluids to keep your pee (urine) clear or pale yellow. Activity  Exercise only as told by your doctor. Stop exercising if you start to have cramps.  Avoid  heavy lifting, wear low heels, and sit up straight.  Do not exercise if it is too hot, too humid, or if you are in a place of great height (high altitude).  You may continue to have sex unless your doctor tells you not to. Relieving pain and discomfort  Wear a good support bra if your breasts are tender.  Take frequent breaks and rest with your legs raised if you have leg cramps or low back pain.  Take warm water baths (sitz baths) to soothe pain or discomfort caused by hemorrhoids. Use hemorrhoid cream if your doctor approves.  If you develop puffy, bulging veins (varicose veins) in your legs: ? Wear support hose or compression stockings as told by your doctor. ? Raise (elevate) your feet for 15 minutes, 3-4 times a day. ? Limit salt in your food. Safety  Wear your seat belt when driving.  Make a list of emergency phone numbers, including numbers for family, friends, the hospital, and police and fire departments. Preparing for your baby's arrival To prepare for the  arrival of your baby:  Take prenatal classes.  Practice driving to the hospital.  Visit the hospital and tour the maternity area.  Talk to your work about taking leave once the baby comes.  Pack your hospital bag.  Prepare the baby's room.  Go to your doctor visits.  Buy a rear-facing car seat. Learn how to install it in your car. General instructions  Do not use hot tubs, steam rooms, or saunas.  Do not use any products that contain nicotine or tobacco, such as cigarettes and e-cigarettes. If you need help quitting, ask your doctor.  Do not drink alcohol.  Do not douche or use tampons or scented sanitary pads.  Do not cross your legs for long periods of time.  Do not travel for long distances unless you must. Only do so if your doctor says it is okay.  Visit your dentist if you have not gone during your pregnancy. Use a soft toothbrush to brush your teeth. Be gentle when you floss.  Avoid cat litter boxes and soil used by cats. These carry germs that can cause birth defects in the baby and can cause a loss of your baby (miscarriage) or stillbirth.  Keep all your prenatal visits as told by your doctor. This is important. Contact a doctor if:  You are not sure if you are in labor or if your water has broken.  You are dizzy.  You have mild cramps or pressure in your lower belly.  You have a nagging pain in your belly area.  You continue to feel sick to your stomach, you throw up, or you have watery poop.  You have bad smelling fluid coming from your vagina.  You have pain when you pee. Get help right away if:  You have a fever.  You are leaking fluid from your vagina.  You are spotting or bleeding from your vagina.  You have severe belly cramps or pain.  You lose or gain weight quickly.  You have trouble catching your breath and have chest pain.  You notice sudden or extreme puffiness (swelling) of your face, hands, ankles, feet, or legs.  You have not  felt the baby move in over an hour.  You have severe headaches that do not go away with medicine.  You have trouble seeing.  You are leaking, or you are having a gush of fluid, from your vagina before you are 37 weeks.  You have regular belly spasms (contractions) before you are 37 weeks. Summary  The third trimester is from week 28 through week 40 (months 7 through 9). This time is when your unborn baby is growing very fast.  Follow your doctor's advice about medicine, food, and activity.  Get ready for the arrival of your baby by taking prenatal classes, getting all the baby items ready, preparing the baby's room, and visiting your doctor to be checked.  Get help right away if you are bleeding from your vagina, or you have chest pain and trouble catching your breath, or if you have not felt your baby move in over an hour. This information is not intended to replace advice given to you by your health care provider. Make sure you discuss any questions you have with your health care provider. Document Revised: 02/28/2019 Document Reviewed: 12/13/2016 Elsevier Patient Education  [employer].

## 2020-10-30 ENCOUNTER — Other Ambulatory Visit: Payer: Self-pay | Admitting: Obstetrics and Gynecology

## 2020-11-09 NOTE — Patient Instructions (Signed)

## 2020-11-09 NOTE — Progress Notes (Signed)
ROB-Pt c/o lower abd pain and braxton hick contraction.

## 2020-11-10 ENCOUNTER — Encounter: Payer: Self-pay | Admitting: Obstetrics and Gynecology

## 2020-11-10 ENCOUNTER — Ambulatory Visit (INDEPENDENT_AMBULATORY_CARE_PROVIDER_SITE_OTHER): Payer: 59 | Admitting: Obstetrics and Gynecology

## 2020-11-10 ENCOUNTER — Other Ambulatory Visit: Payer: Self-pay

## 2020-11-10 VITALS — BP 114/72 | HR 96 | Wt 173.6 lb

## 2020-11-10 DIAGNOSIS — O99013 Anemia complicating pregnancy, third trimester: Secondary | ICD-10-CM

## 2020-11-10 DIAGNOSIS — Z3A32 32 weeks gestation of pregnancy: Secondary | ICD-10-CM

## 2020-11-10 DIAGNOSIS — Z3483 Encounter for supervision of other normal pregnancy, third trimester: Secondary | ICD-10-CM

## 2020-11-10 LAB — POCT URINALYSIS DIPSTICK OB
Bilirubin, UA: NEGATIVE
Blood, UA: NEGATIVE
Glucose, UA: NEGATIVE
Ketones, UA: NEGATIVE
Leukocytes, UA: NEGATIVE
Nitrite, UA: NEGATIVE
POC,PROTEIN,UA: NEGATIVE
Spec Grav, UA: 1.01 (ref 1.010–1.025)
Urobilinogen, UA: 0.2 E.U./dL
pH, UA: 6.5 (ref 5.0–8.0)

## 2020-11-10 NOTE — Progress Notes (Signed)
ROB: Patient notes feeling pelvic pain and Braxton Hicks. Usually wears pregnancy girdle. Fatigue noted, but trying to rest more. Taking QOD iron supplement for mild anemia. Notes a few elevated blood sugars but is having difficulties with willpower due to the holidays. Blood sugars overall still well controlled with diet. Advised that she can decrease testing to 2 times per day. Discussed plans for repeat C-section, will schedule for 12/26/2019.  RTC in 2 weeks.

## 2020-11-11 DIAGNOSIS — M461 Sacroiliitis, not elsewhere classified: Secondary | ICD-10-CM | POA: Diagnosis not present

## 2020-11-11 DIAGNOSIS — M9904 Segmental and somatic dysfunction of sacral region: Secondary | ICD-10-CM | POA: Diagnosis not present

## 2020-11-11 DIAGNOSIS — M9901 Segmental and somatic dysfunction of cervical region: Secondary | ICD-10-CM | POA: Diagnosis not present

## 2020-11-11 DIAGNOSIS — M9902 Segmental and somatic dysfunction of thoracic region: Secondary | ICD-10-CM | POA: Diagnosis not present

## 2020-11-11 DIAGNOSIS — M9903 Segmental and somatic dysfunction of lumbar region: Secondary | ICD-10-CM | POA: Diagnosis not present

## 2020-11-11 DIAGNOSIS — M4603 Spinal enthesopathy, cervicothoracic region: Secondary | ICD-10-CM | POA: Diagnosis not present

## 2020-11-11 NOTE — Telephone Encounter (Signed)
Yes, if it can be either 7:30 I can start clinic at 9, or if it's at lunchtime then I won't have to move anyone.

## 2020-11-11 NOTE — Telephone Encounter (Signed)
Please advise. Thanks Idil Maslanka 

## 2020-11-25 ENCOUNTER — Encounter: Payer: Self-pay | Admitting: Obstetrics and Gynecology

## 2020-11-25 ENCOUNTER — Ambulatory Visit (INDEPENDENT_AMBULATORY_CARE_PROVIDER_SITE_OTHER): Payer: 59 | Admitting: Obstetrics and Gynecology

## 2020-11-25 ENCOUNTER — Other Ambulatory Visit: Payer: Self-pay

## 2020-11-25 VITALS — BP 104/64 | HR 94 | Wt 174.2 lb

## 2020-11-25 DIAGNOSIS — O34219 Maternal care for unspecified type scar from previous cesarean delivery: Secondary | ICD-10-CM

## 2020-11-25 DIAGNOSIS — Z3483 Encounter for supervision of other normal pregnancy, third trimester: Secondary | ICD-10-CM

## 2020-11-25 DIAGNOSIS — Z3A35 35 weeks gestation of pregnancy: Secondary | ICD-10-CM

## 2020-11-25 DIAGNOSIS — O24410 Gestational diabetes mellitus in pregnancy, diet controlled: Secondary | ICD-10-CM

## 2020-11-25 DIAGNOSIS — O09522 Supervision of elderly multigravida, second trimester: Secondary | ICD-10-CM

## 2020-11-25 LAB — POCT URINALYSIS DIPSTICK OB
Bilirubin, UA: NEGATIVE
Blood, UA: NEGATIVE
Glucose, UA: NEGATIVE
Leukocytes, UA: NEGATIVE
Nitrite, UA: NEGATIVE
POC,PROTEIN,UA: NEGATIVE
Spec Grav, UA: 1.01 (ref 1.010–1.025)
Urobilinogen, UA: 0.2 E.U./dL
pH, UA: 6.5 (ref 5.0–8.0)

## 2020-11-25 NOTE — Progress Notes (Signed)
ROB: Patient doing well. Daily fetal movement. Sugars reviewed-diet controlled diabetes. Covid vaccination and prevention discussed. Cultures next visit.

## 2020-11-26 DIAGNOSIS — M9904 Segmental and somatic dysfunction of sacral region: Secondary | ICD-10-CM | POA: Diagnosis not present

## 2020-11-26 DIAGNOSIS — M9903 Segmental and somatic dysfunction of lumbar region: Secondary | ICD-10-CM | POA: Diagnosis not present

## 2020-11-26 DIAGNOSIS — M9901 Segmental and somatic dysfunction of cervical region: Secondary | ICD-10-CM | POA: Diagnosis not present

## 2020-11-26 DIAGNOSIS — M9902 Segmental and somatic dysfunction of thoracic region: Secondary | ICD-10-CM | POA: Diagnosis not present

## 2020-11-26 DIAGNOSIS — M461 Sacroiliitis, not elsewhere classified: Secondary | ICD-10-CM | POA: Diagnosis not present

## 2020-11-26 DIAGNOSIS — M4603 Spinal enthesopathy, cervicothoracic region: Secondary | ICD-10-CM | POA: Diagnosis not present

## 2020-12-01 ENCOUNTER — Ambulatory Visit (INDEPENDENT_AMBULATORY_CARE_PROVIDER_SITE_OTHER): Payer: 59 | Admitting: Obstetrics and Gynecology

## 2020-12-01 ENCOUNTER — Encounter: Payer: Self-pay | Admitting: Obstetrics and Gynecology

## 2020-12-01 ENCOUNTER — Other Ambulatory Visit: Payer: Self-pay

## 2020-12-01 VITALS — BP 100/65 | HR 90 | Ht 64.0 in | Wt 173.8 lb

## 2020-12-01 DIAGNOSIS — Z3685 Encounter for antenatal screening for Streptococcus B: Secondary | ICD-10-CM

## 2020-12-01 DIAGNOSIS — Z3483 Encounter for supervision of other normal pregnancy, third trimester: Secondary | ICD-10-CM

## 2020-12-01 DIAGNOSIS — Z3A35 35 weeks gestation of pregnancy: Secondary | ICD-10-CM

## 2020-12-01 DIAGNOSIS — O34219 Maternal care for unspecified type scar from previous cesarean delivery: Secondary | ICD-10-CM

## 2020-12-01 LAB — POCT URINALYSIS DIPSTICK OB
Bilirubin, UA: NEGATIVE
Blood, UA: NEGATIVE
Glucose, UA: NEGATIVE
Leukocytes, UA: NEGATIVE
Nitrite, UA: NEGATIVE
POC,PROTEIN,UA: NEGATIVE
Spec Grav, UA: 1.01 (ref 1.010–1.025)
Urobilinogen, UA: 0.2 E.U./dL
pH, UA: 7 (ref 5.0–8.0)

## 2020-12-01 NOTE — Progress Notes (Signed)
ROB-Pt present for routine prenatal care. Pt stated having lower abd pressure, braxton hick contractions and increase in vaginal discharge.

## 2020-12-01 NOTE — Progress Notes (Signed)
ROB: Patient notes she is nervous about C-section as it draws closer (concerns about feeling sick and also feelings of not being able to breathe during C-section as this happened with her las C-section). Discussed concerns with patient, also advised to discuss concerns with Anesthesia prior to delivery.  36 week cultures performed today. Notes increase in vaginal discharge but denies itching/burning. Blood sugars still wnl (only 1 value elevated, 138 postprandial). RTC in 1 week.

## 2020-12-01 NOTE — Patient Instructions (Addendum)
Common Medications Safe in Pregnancy  Acne:      Constipation:  Benzoyl Peroxide     Colace  Clindamycin      Dulcolax Suppository  Topica Erythromycin     Fibercon  Salicylic Acid      Metamucil         Miralax AVOID:        Senakot   Accutane    Cough:  Retin-A       Cough Drops  Tetracycline      Phenergan w/ Codeine if Rx  Minocycline      Robitussin (Plain & DM)  Antibiotics:     Crabs/Lice:  Ceclor       RID  Cephalosporins    AVOID:  E-Mycins      Kwell  Keflex  Macrobid/Macrodantin   Diarrhea:  Penicillin      Kao-Pectate  Zithromax      Imodium AD         PUSH FLUIDS AVOID:       Cipro     Fever:  Tetracycline      Tylenol (Regular or Extra  Minocycline       Strength)  Levaquin      Extra Strength-Do not          Exceed 8 tabs/24 hrs Caffeine:        <200mg/day (equiv. To 1 cup of coffee or  approx. 3 12 oz sodas)         Gas: Cold/Hayfever:       Gas-X  Benadryl      Mylicon  Claritin       Phazyme  **Claritin-D        Chlor-Trimeton    Headaches:  Dimetapp      ASA-Free Excedrin  Drixoral-Non-Drowsy     Cold Compress  Mucinex (Guaifenasin)     Tylenol (Regular or Extra  Sudafed/Sudafed-12 Hour     Strength)  **Sudafed PE Pseudoephedrine   Tylenol Cold & Sinus     Vicks Vapor Rub  Zyrtec  **AVOID if Problems With Blood Pressure         Heartburn: Avoid lying down for at least 1 hour after meals  Aciphex      Maalox     Rash:  Milk of Magnesia     Benadryl    Mylanta       1% Hydrocortisone Cream  Pepcid  Pepcid Complete   Sleep Aids:  Prevacid      Ambien   Prilosec       Benadryl  Rolaids       Chamomile Tea  Tums (Limit 4/day)     Unisom         Tylenol PM         Warm milk-add vanilla or  Hemorrhoids:       Sugar for taste  Anusol/Anusol H.C.  (RX: Analapram 2.5%)  Sugar Substitutes:  Hydrocortisone OTC     Ok in moderation  Preparation H      Tucks        Vaseline lotion applied to tissue with  wiping    Herpes:     Throat:  Acyclovir      Oragel  Famvir  Valtrex     Vaccines:         Flu Shot Leg Cramps:       *Gardasil  Benadryl      Hepatitis A         Hepatitis B Nasal Spray:         Pneumovax  Saline Nasal Spray     Polio Booster         Tetanus Nausea:       Tuberculosis test or PPD  Vitamin B6 25 mg TID   AVOID:    Dramamine      *Gardasil  Emetrol       Live Poliovirus  Ginger Root 250 mg QID    MMR (measles, mumps &  High Complex Carbs @ Bedtime    rebella)  Sea Bands-Accupressure    Varicella (Chickenpox)  Unisom 1/2 tab TID     *No known complications           If received before Pain:         Known pregnancy;   Darvocet       Resume series after  Lortab        Delivery  Percocet    Yeast:   Tramadol      Femstat  Tylenol 3      Gyne-lotrimin  Ultram       Monistat  Vicodin           MISC:         All Sunscreens           Hair Coloring/highlights          Insect Repellant's          (Including DEET)         Mystic Tans   Third Trimester of Pregnancy  The third trimester of pregnancy is from week 28 through week 40. This is also called months 7 through 9. This trimester is when your unborn baby (fetus) is growing very fast. At the end of the ninth month, the unborn baby is about 20 inches long. It weighs about 6-10 pounds. Body changes during your third trimester Your body continues to go through many changes during this time. The changes vary and generally return to normal after the baby is born. Physical changes  Your weight will continue to increase. You may gain 25-35 pounds (11-16 kg) by the end of the pregnancy. If you are underweight, you may gain 28-40 lb (about 13-18 kg). If you are overweight, you may gain 15-25 lb (about 7-11 kg).  You may start to get stretch marks on your hips, belly (abdomen), and breasts.  Your breasts will continue to grow and may hurt. A yellow fluid (colostrum) may leak from your breasts. This is the first milk you  are making for your baby.  You may have changes in your hair.  Your belly button may stick out.  You may have more swelling in your hands, face, or ankles. Health changes  You may have heartburn.  You may have trouble pooping (constipation).  You may get hemorrhoids. These are swollen veins in the butt that can itch or get painful.  You may have swollen veins (varicose veins) in your legs.  You may have more body aches in the pelvis, back, or thighs.  You may have more tingling or numbness in your hands, arms, and legs. The skin on your belly may also feel numb.  You may feel short of breath as your womb (uterus) gets bigger. Other changes  You may pee (urinate) more often.  You may have more problems sleeping.  You may notice the unborn baby "dropping," or moving lower in your belly.  You may have more discharge coming from your vagina.  Your joints may feel loose, and you may have pain  around your pelvic bone. Follow these instructions at home: Medicines  Take over-the-counter and prescription medicines only as told by your doctor. Some medicines are not safe during pregnancy.  Take a prenatal vitamin that contains at least 600 micrograms (mcg) of folic acid. Eating and drinking  Eat healthy meals that include: ? Fresh fruits and vegetables. ? Whole grains. ? Good sources of protein, such as meat, eggs, or tofu. ? Low-fat dairy products.  Avoid raw meat and unpasteurized juice, milk, and cheese. These carry germs that can harm you and your baby.  Eat 4 or 5 small meals rather than 3 large meals a day.  You may need to take these actions to prevent or treat trouble pooping: ? Drink enough fluids to keep your pee (urine) pale yellow. ? Eat foods that are high in fiber. These include beans, whole grains, and fresh fruits and vegetables. ? Limit foods that are high in fat and sugar. These include fried or sweet foods. Activity  Exercise only as told by your  doctor. Stop exercising if you start to have cramps in your womb.  Avoid heavy lifting.  Do not exercise if it is too hot or too humid, or if you are in a place of great height (high altitude).  If you choose to, you may have sex unless your doctor tells you not to. Relieving pain and discomfort  Take breaks often, and rest with your legs raised (elevated) if you have leg cramps or low back pain.  Take warm water baths (sitz baths) to soothe pain or discomfort caused by hemorrhoids. Use hemorrhoid cream if your doctor approves.  Wear a good support bra if your breasts are tender.  If you develop bulging, swollen veins in your legs: ? Wear support hose as told by your doctor. ? Raise your feet for 15 minutes, 3-4 times a day. ? Limit salt in your food. Safety  Talk to your doctor before traveling far distances.  Do not use hot tubs, steam rooms, or saunas.  Wear your seat belt at all times when you are in a car.  Talk with your doctor if someone is hurting you or yelling at you a lot. Preparing for your baby's arrival To prepare for the arrival of your baby:  Take prenatal classes.  Visit the hospital and tour the maternity area.  Buy a rear-facing car seat. Learn how to install it in your car.  Prepare the baby's room. Take out all pillows and stuffed animals from the baby's crib. General instructions  Avoid cat litter boxes and soil used by cats. These carry germs that can cause harm to the baby and can cause a loss of your baby by miscarriage or stillbirth.  Do not douche or use tampons. Do not use scented sanitary pads.  Do not smoke or use any products that contain nicotine or tobacco. If you need help quitting, ask your doctor.  Do not drink alcohol.  Do not use herbal medicines, illegal drugs, or medicines that were not approved by your doctor. Chemicals in these products can affect your baby.  Keep all follow-up visits. This is important. Where to find more  information  American Pregnancy Association: americanpregnancy.org  [employer]: www.acog.org  Office on Women's Health: KeywordPortfolios.com.br Contact a doctor if:  You have a fever.  You have mild cramps or pressure in your lower belly.  You have a nagging pain in your belly area.  You vomit, or you have  watery poop (diarrhea).  You have bad-smelling fluid coming from your vagina.  You have pain when you pee, or your pee smells bad.  You have a headache that does not go away when you take medicine.  You have changes in how you see, or you see spots in front of your eyes. Get help right away if:  Your water breaks.  You have regular contractions that are less than 5 minutes apart.  You are spotting or bleeding from your vagina.  You have very bad belly cramps or pain.  You have trouble breathing.  You have chest pain.  You faint.  You have not felt the baby move for the amount of time told by your doctor.  You have new or increased pain, swelling, or redness in an arm or leg. Summary  The third trimester is from week 28 through week 40 (months 7 through 9). This is the time when your unborn baby is growing very fast.  During this time, your discomfort may increase as you gain weight and as your baby grows.  Get ready for your baby to arrive by taking prenatal classes, buying a rear-facing car seat, and preparing the baby's room.  Get help right away if you are bleeding from your vagina, you have chest pain and trouble breathing, or you have not felt the baby move for the amount of time told by your doctor. This information is not intended to replace advice given to you by your health care provider. Make sure you discuss any questions you have with your health care provider. Document Revised: 04/15/2020 Document Reviewed: 02/20/2020 Elsevier Patient Education  2021 Elsevier Inc.  

## 2020-12-03 LAB — STREP GP B NAA: Strep Gp B NAA: NEGATIVE

## 2020-12-04 LAB — GC/CHLAMYDIA PROBE AMP
Chlamydia trachomatis, NAA: NEGATIVE
Neisseria Gonorrhoeae by PCR: NEGATIVE

## 2020-12-04 NOTE — Telephone Encounter (Signed)
Please advise. Thanks Evann Koelzer 

## 2020-12-08 ENCOUNTER — Encounter: Payer: 59 | Admitting: Obstetrics and Gynecology

## 2020-12-09 ENCOUNTER — Ambulatory Visit (INDEPENDENT_AMBULATORY_CARE_PROVIDER_SITE_OTHER): Payer: 59 | Admitting: Obstetrics and Gynecology

## 2020-12-09 ENCOUNTER — Encounter: Payer: Self-pay | Admitting: Obstetrics and Gynecology

## 2020-12-09 ENCOUNTER — Other Ambulatory Visit: Payer: Self-pay

## 2020-12-09 VITALS — BP 130/80 | HR 96 | Wt 175.5 lb

## 2020-12-09 DIAGNOSIS — Z3483 Encounter for supervision of other normal pregnancy, third trimester: Secondary | ICD-10-CM

## 2020-12-09 DIAGNOSIS — Z3A37 37 weeks gestation of pregnancy: Secondary | ICD-10-CM

## 2020-12-09 LAB — POCT URINALYSIS DIPSTICK OB
Bilirubin, UA: NEGATIVE
Blood, UA: NEGATIVE
Glucose, UA: NEGATIVE
Ketones, UA: NEGATIVE
Leukocytes, UA: NEGATIVE
Nitrite, UA: NEGATIVE
POC,PROTEIN,UA: NEGATIVE
Spec Grav, UA: <=1.005 — AB (ref 1.010–1.025)
Urobilinogen, UA: 0.2 E.U./dL
pH, UA: 7 (ref 5.0–8.0)

## 2020-12-09 NOTE — Progress Notes (Signed)
ROB: No complaints.  Blood sugars excellent.  Occasional [address].  Daily fetal movement.  Repeat cesarean delivery discussed.  Questions answered.

## 2020-12-10 DIAGNOSIS — M4603 Spinal enthesopathy, cervicothoracic region: Secondary | ICD-10-CM | POA: Diagnosis not present

## 2020-12-10 DIAGNOSIS — M461 Sacroiliitis, not elsewhere classified: Secondary | ICD-10-CM | POA: Diagnosis not present

## 2020-12-10 DIAGNOSIS — M9902 Segmental and somatic dysfunction of thoracic region: Secondary | ICD-10-CM | POA: Diagnosis not present

## 2020-12-10 DIAGNOSIS — M9903 Segmental and somatic dysfunction of lumbar region: Secondary | ICD-10-CM | POA: Diagnosis not present

## 2020-12-10 DIAGNOSIS — M9904 Segmental and somatic dysfunction of sacral region: Secondary | ICD-10-CM | POA: Diagnosis not present

## 2020-12-10 DIAGNOSIS — M9901 Segmental and somatic dysfunction of cervical region: Secondary | ICD-10-CM | POA: Diagnosis not present

## 2020-12-15 ENCOUNTER — Encounter: Payer: Self-pay | Admitting: Obstetrics and Gynecology

## 2020-12-15 ENCOUNTER — Other Ambulatory Visit: Payer: Self-pay

## 2020-12-15 ENCOUNTER — Ambulatory Visit (INDEPENDENT_AMBULATORY_CARE_PROVIDER_SITE_OTHER): Payer: 59 | Admitting: Obstetrics and Gynecology

## 2020-12-15 VITALS — BP 97/64 | HR 88 | Ht 64.0 in | Wt 175.2 lb

## 2020-12-15 DIAGNOSIS — Z3A37 37 weeks gestation of pregnancy: Secondary | ICD-10-CM

## 2020-12-15 DIAGNOSIS — Z3483 Encounter for supervision of other normal pregnancy, third trimester: Secondary | ICD-10-CM

## 2020-12-15 DIAGNOSIS — O34219 Maternal care for unspecified type scar from previous cesarean delivery: Secondary | ICD-10-CM

## 2020-12-15 LAB — POCT URINALYSIS DIPSTICK OB
Bilirubin, UA: NEGATIVE
Glucose, UA: NEGATIVE
Ketones, UA: NEGATIVE
Leukocytes, UA: NEGATIVE
Nitrite, UA: NEGATIVE
POC,PROTEIN,UA: NEGATIVE
Spec Grav, UA: <=1.005 — AB (ref 1.010–1.025)
Urobilinogen, UA: 0.2 E.U./dL
pH, UA: 7 (ref 5.0–8.0)

## 2020-12-15 NOTE — Patient Instructions (Addendum)
COVID 19 Instructions for Scheduled Procedure (Inductions/C-sections and GYN surgeries)   Thank you for choosing [facility] for your services.  You have been scheduled for a procedure called _____Repeat Cesarean Section ___________.    Your procedure is scheduled on ______2/3/2022___________.  You are required to have COVID-19 testing performed 2 days prior to your scheduled procedure date.  Testing is performed between 9 AM and 1 PM Monday through Friday.  Please present for testing on ______1/31/2022 or 2/1/2022__________________ during this hour. Drive up testing is performed in front of the [facility] (this is next to the [facility]).    Upon your scheduled procedure date, you will need to arrive at the [place] entrance. (There is a statue at the front of this entrance.)   Please arrive on time if you are scheduled for an induction of labor.   If you are scheduled for a Cesarean delivery or for Gyn Surgery, arrive 2 hours prior to your procedure time.   If you are an Obstetric patient and your arrival time falls between 11 PM and 6 AM call L&D [phone]) when you arrive.  A staff member will meet you at the [place] entrance.  At this time, patients are allowed 1 support person to accompany them. Face masks are required for you and your support person. Your support person is now allowed to be there with you during the entire time of your admission.   Please contact the office if you have any questions regarding this information.  The [facility] office number is [phone].     Thank you,    Your [facility] Providers       Cesarean Delivery Cesarean birth, or cesarean delivery, is the surgical delivery of a baby through an incision in the abdomen and the uterus. This may be referred to as a C-section. This procedure may be scheduled ahead of time, or it may be done in an emergency situation. Tell a health care provider about:  Any  allergies you have.  All medicines you are taking, including vitamins, herbs, eye drops, creams, and over-the-counter medicines.  Any problems you or family members have had with anesthetic medicines.  Any blood disorders you have.  Any surgeries you have had.  Any medical conditions you have.  Whether you or any members of your family have a history of deep vein thrombosis (DVT) or pulmonary embolism (PE). What are the risks? Generally, this is a safe procedure. However, problems may occur, including:  Infection.  Bleeding.  Allergic reactions to medicines.  Damage to other structures or organs.  Blood clots.  Injury to your baby. What happens before the procedure? General instructions  Follow instructions from your health care provider about eating or drinking restrictions.  If you know that you are going to have a cesarean delivery, do not shave your pubic area. Shaving before the procedure may increase your risk of infection.  Plan to have someone take you home from the hospital.  Ask your health care provider what steps will be taken to prevent infection. These may include: ? Removing hair at the surgery site. ? Washing skin with a germ-killing soap. ? Taking antibiotic medicine.  Depending on the reason for your cesarean delivery, you may have a physical exam or additional testing, such as an ultrasound.  You may have your blood or urine tested. Questions for your health care provider  Ask your health care provider about: ? Changing or stopping your  regular medicines. This is especially important if you are taking diabetes medicines or blood thinners. ? Your pain management plan. This is especially important if you plan to breastfeed your baby. ? How long you will be in the hospital after the procedure. ? Any concerns you may have about receiving blood products, if you need them during the procedure. ? Cord blood banking, if you plan to collect your baby's  umbilical cord blood.  You may also want to ask your health care provider: ? Whether you will be able to hold or breastfeed your baby while you are still in the operating room. ? Whether your baby can stay with you immediately after the procedure and during your recovery. ? Whether a family member or a person of your choice can go with you into the operating room and stay with you during the procedure, immediately after the procedure, and during your recovery. What happens during the procedure?  An IV will be inserted into one of your veins.  Fluid and medicines, such as antibiotics, will be given before the surgery.  Fetal monitors will be placed on your abdomen to check your baby's heart rate.  You may be given a special warming gown to wear to keep your temperature stable.  A catheter may be inserted into your bladder through your urethra. This drains your urine during the procedure.  You may be given one or more of the following: ? A medicine to numb the area (local anesthetic). ? A medicine to make you fall asleep (general anesthetic). ? A medicine (regional anesthetic) that is injected into your back or through a small thin tube placed in your back (spinal anesthetic or epidural anesthetic). This numbs everything below the injection site and allows you to stay awake during your procedure. If this makes you feel nauseous, tell your health care provider. Medicines will be available to help reduce any nausea you may feel.  An incision will be made in your abdomen, and then in your uterus.  If you are awake during your procedure, you may feel tugging and pulling in your abdomen, but you should not feel pain. If you feel pain, tell your health care provider immediately.  Your baby will be removed from your uterus. You may feel more pressure or pushing while this happens.  Immediately after birth, your baby will be dried and kept warm. You may be able to hold and breastfeed your  baby.  The umbilical cord may be clamped and cut during this time. This usually occurs after waiting a period of 1-2 minutes after delivery.  Your placenta will be removed from your uterus.  Your incisions will be closed with stitches (sutures). Staples, skin glue, or adhesive strips may also be applied to the incision in your abdomen.  Bandages (dressings) may be placed over the incision in your abdomen. The procedure may vary among health care providers and hospitals.   What happens after the procedure?  Your blood pressure, heart rate, breathing rate, and blood oxygen level will be monitored until you are discharged from the hospital.  You may continue to receive fluids and medicines through an IV.  You will have some pain. Medicines will be available to help control your pain.  To help prevent blood clots: ? You may be given medicines. ? You may have to wear compression stockings or devices. ? You will be encouraged to walk around when you are able.  Hospital staff will encourage and support bonding with your baby.  Your hospital may have you and your baby to stay in the same room (rooming in) during your hospital stay to encourage successful bonding and breastfeeding.  You may be encouraged to cough and breathe deeply often. This helps to prevent lung problems.  If you have a catheter draining your urine, it will be removed as soon as possible after your procedure. Summary  Cesarean birth, or cesarean delivery, is the surgical delivery of a baby through an incision in the abdomen and the uterus.  Follow instructions from your health care provider about eating or drinking restrictions before the procedure.  You will have some pain after the procedure. Medicines will be available to help control your pain.  Hospital staff will encourage and support bonding with your baby after the procedure. Your hospital may have you and your baby to stay in the same room (rooming in) during  your hospital stay to encourage successful bonding and breastfeeding. This information is not intended to replace advice given to you by your health care provider. Make sure you discuss any questions you have with your health care provider. Document Revised: 07/06/2020 Document Reviewed: 05/14/2018 Elsevier Patient Education  [place].

## 2020-12-15 NOTE — Progress Notes (Signed)
ROB-Pt present for routine prenatal care. Pt c/o lower abd/vaginal pressure and braxton hick contractions.

## 2020-12-15 NOTE — Progress Notes (Signed)
Notes scratchy throat, but mostly at night. No other symptoms. Also with dry mouth. Has been ongoing for 3-4 weeks. Had recent COVID exposure on 1/13, has been tested with negative results. Reviewed blood sugar log, 2 postprandials elevated (136, 140). All other values normal. Answered all questions regarding C-section  Scheduled next week.

## 2020-12-17 ENCOUNTER — Other Ambulatory Visit: Payer: Self-pay

## 2020-12-17 ENCOUNTER — Other Ambulatory Visit
Admission: RE | Admit: 2020-12-17 | Discharge: 2020-12-17 | Disposition: A | Discharge status: Home / Self Care | Payer: 59 | Source: Ambulatory Visit | Attending: Obstetrics and Gynecology | Admitting: Obstetrics and Gynecology

## 2020-12-17 HISTORY — DX: Family history of other specified conditions: Z84.89

## 2020-12-17 NOTE — Patient Instructions (Addendum)
Arrival Time: Please call Labor and Delivery the day before your scheduled C-Section to find out your arrival time. [phone].  Arrival: Arrive to the [facility]. If your arrival time is prior to 6:00am, please call Labor and Delivery [phone] from your cell phone upon arrival and someone from L&D or security will come down to the [place] entrance and escort you to Labor and Delivery. If your arrival time is 6:00am or later, please enter the [place] and follow the greeter's instructions.   Partner: ONE dedicated partner/guest is allowed to accompany you to Labor and Delivery and into the OR. You may have TWO dedicated visitors during the remainder of your stay on the Mother/Baby Unit. Your guests may leave and return during normal visiting hours, but will have to be re-screened with each entry to the [place]. These two visitors are not allowed to switch out for a new guest at any point during your hospital stay.  REMEMBER: Instructions that are not followed completely may result in serious medical risk, up to and including death; or upon the discretion of your surgeon and anesthesiologist your surgery may need to be rescheduled.  Do not eat food after midnight the night before surgery. You may however drink water up until 2 hours prior to arrival to the hospital. No gum chewing, lozengers or hard candies.  TAKE THESE MEDICATIONS THE MORNING OF SURGERY:  1.  Albuterol inhaler  Use inhaler on the day of surgery and bring to the hospital.  One week prior to surgery: Stop Anti-inflammatories (NSAIDS) such as Advil, Aleve, Ibuprofen, Motrin, Naproxen, Naprosyn and Aspirin based products such as Excedrin, Goodys Powder, BC Powder. Stop ANY OVER THE COUNTER supplements until after surgery. (However, you may continue taking Vitamin D and multivitamin up until the day before surgery.)  No Alcohol for 24 hours before or after surgery.  On the morning of surgery brush your  teeth with toothpaste and water, you may rinse your mouth with mouthwash if you wish. Do not swallow any toothpaste or mouthwash.  Do not wear jewelry, make-up, hairpins, clips or nail polish.  Do not wear lotions, powders, or perfumes.   Do not shave body from the neck down 48 hours prior to surgery just in case you cut yourself which could leave a site for infection.  Also, freshly shaved skin may become irritated if using the CHG soap.  Contact lenses may not be worn into surgery.  Do not bring valuables to the hospital. [facility] is not responsible for any missing/lost belongings or valuables.   Use CHG wipes as directed on instruction sheet.  Notify your doctor if there is any change in your medical condition (cold, fever, infection).  Plan for stool softeners for home use; pain medications have a tendency to cause constipation. You can also help prevent constipation by eating foods high in fiber such as fruits and vegetables and drinking plenty of fluids as your diet allows.  After surgery, you can help prevent lung complications by doing breathing exercises.  Take deep breaths and cough every 1-2 hours. Your doctor may order a device called an Incentive Spirometer to help you take deep breaths. When coughing or sneezing, hold a pillow firmly against your incision with both hands. This is called "splinting." Doing this helps protect your incision. It also decreases belly discomfort.  Please call the [place] Dept. at [phone] if you have any questions about these instructions.

## 2020-12-21 ENCOUNTER — Encounter
Admission: RE | Admit: 2020-12-21 | Discharge: 2020-12-21 | Disposition: A | Discharge status: Home / Self Care | Payer: 59 | Source: Ambulatory Visit | Attending: Obstetrics and Gynecology | Admitting: Obstetrics and Gynecology

## 2020-12-21 ENCOUNTER — Other Ambulatory Visit: Payer: Self-pay

## 2020-12-21 ENCOUNTER — Other Ambulatory Visit: Payer: 59

## 2020-12-21 DIAGNOSIS — Z20822 Contact with and (suspected) exposure to covid-19: Secondary | ICD-10-CM | POA: Insufficient documentation

## 2020-12-21 DIAGNOSIS — Z01812 Encounter for preprocedural laboratory examination: Secondary | ICD-10-CM | POA: Insufficient documentation

## 2020-12-21 LAB — CBC
HCT: 33.3 % — ABNORMAL LOW (ref 36.0–46.0)
Hemoglobin: 11.9 g/dL — ABNORMAL LOW (ref 12.0–15.0)
MCH: 32.7 pg (ref 26.0–34.0)
MCHC: 35.7 g/dL (ref 30.0–36.0)
MCV: 91.5 fL (ref 80.0–100.0)
Platelets: 137 10*3/uL — ABNORMAL LOW (ref 150–400)
RBC: 3.64 MIL/uL — ABNORMAL LOW (ref 3.87–5.11)
RDW: 14.7 % (ref 11.5–15.5)
WBC: 6.8 10*3/uL (ref 4.0–10.5)
nRBC: 0 % (ref 0.0–0.2)

## 2020-12-21 LAB — TYPE AND SCREEN
ABO/RH(D): O NEG
Antibody Screen: NEGATIVE
Extend sample reason: UNDETERMINED

## 2020-12-21 LAB — SARS CORONAVIRUS 2 (TAT 6-24 HRS): SARS Coronavirus 2: NEGATIVE

## 2020-12-21 NOTE — Progress Notes (Signed)
[facility] Perioperative Services: Pre-Admission/Anesthesia Testing   Date: 12/21/20 Name: BRENLYNN FAKE MRN:   [id number]  Re: Consideration of preoperative prophylactic antibiotic change   Request sent to: Rubie Maid, MD (routed and/or faxed via [facility])  Planned Surgical Procedure(s):    Case: [id number] Date/Time: 12/24/20 0730   Procedure: CESAREAN SECTION (N/A ) - REPEAT CESAREAN SECTION   Anesthesia type: Spinal   Pre-op diagnosis: Supervision of normal pregnancy   Location: [facility] C-SECTION RM 02 / [facility] ORS FOR ANESTHESIA GROUP   Surgeons: Rubie Maid, MD    Notes: 1. Patient has a documented allergy to CEFIXIME only . Advising that this medication has caused her to experience low severity rash in the past.   2. Received cephalosporin with no documented complications . CEFAZOLIN received on 02/06/2015  3. Screened as appropriate for cephalosporin use during medication reconciliation . No immediate angioedema, dysphagia, SOB, anaphylaxis symptoms. . No severe rash involving mucous membranes or skin necrosis. . No hospital admissions related to side effects of PCN/cephalosporin use.  . No documented reaction to PCN or cephalosporin in the last 10 years.  Request:  As an evidence based approach to reducing the rate of incidence for post-operative SSI and the development of MDROs, could an agent with narrower coverage for preoperative prophylaxis in this patient's upcoming surgical course be considered?   1. Currently ordered preoperative prophylactic ABX: clindamycin and gentamicin.   2. Specifically requesting change to cephalosporin (CEFAZOLIN).   3. Please communicate decision with me and I will change the orders in Epic as per your direction.   Things to consider:  Many patients report that they were "allergic" to PCN earlier in life, however this does not translate into a true lifelong allergy. Patients can lose sensitivity to specific IgE  antibodies over time if PCN is avoided (Kleris & Lugar, 2019).   Up to 10% of the adult population and 15% of hospitalized patients report an allergy to PCN, however clinical studies suggest that 90% of those reporting an allergy can tolerate PCN antibiotics (Kleris & Lugar, 2019).   Cross-sensitivity between PCN and cephalosporins has been documented as being as high as 10%, however this estimation included data believed to have been collected in a setting where there was contamination. Newer data suggests that the prevalence of cross-sensitivity between PCN and cephalosporins is actually estimated to be closer to 1% (Hermanides et al., 2018).    Patients labeled as PCN allergic, whether they are truly allergic or not, have been found to have inferior outcomes in terms of rates of serious infection, and these patients tend to have longer hospital stays (Calvin, 2019).   Treatment related secondary infections, such as Clostridioides difficile, have been linked to the improper use of broad spectrum antibiotics in patients improperly labeled as PCN allergic (Kleris & Lugar, 2019).   Anaphylaxis from cephalosporins is rare and the evidence suggests that there is no increased risk of an anaphylactic type reaction when cephalosporins are used in a PCN allergic patient (Pichichero, 2006).  Citations: Hermanides J, Lemkes BA, Prins Pearla Dubonnet MW, Terreehorst I. Presumed ?-Lactam Allergy and Cross-reactivity in the Operating Theater: A Practical Approach. Anesthesiology. 2018 Aug;129(2):335-342. doi: 10.1097/ALN.0000000000002252. PMID: 57846962.  Kleris, [place]., & Lugar, P. L. (2019). Things We Do For No Reason: Failing to Question a Penicillin Allergy History. Journal of hospital medicine, 14(10), [phone]. Advance online publication. https://www.wallace-middleton.info/  Pichichero, M. E. (2006). Cephalosporins can be prescribed safely for penicillin-allergic patients. Journal of family  medicine,  55(2), 106-112. Accessed: https://cdn.mdedge.com/files/s76fs-public/Document/September-2017/5502JFP_AppliedEvidence1.pdf   Honor Loh, MSN, APRN, FNP-C, CEN [facility]  Peri-operative Services Nurse Practitioner FAX: [phone] 12/21/20 10:24 AM

## 2020-12-22 LAB — RPR: RPR Ser Ql: NONREACTIVE

## 2020-12-23 MED ORDER — GABAPENTIN 300 MG PO CAPS
300.0000 mg | ORAL_CAPSULE | ORAL | Status: AC
  Administered 2020-12-24: 300 mg via ORAL
  Filled 2020-12-23: qty 1

## 2020-12-23 MED ORDER — CLINDAMYCIN PHOSPHATE 900 MG/50ML IV SOLN
900.0000 mg | INTRAVENOUS | Status: AC
  Administered 2020-12-24: 900 mg via INTRAVENOUS
  Filled 2020-12-23: qty 50

## 2020-12-23 MED ORDER — GENTAMICIN SULFATE 40 MG/ML IJ SOLN
5.0000 mg/kg | INTRAVENOUS | Status: AC
  Administered 2020-12-24: 400 mg via INTRAVENOUS
  Filled 2020-12-23: qty 10

## 2020-12-23 MED ORDER — POVIDONE-IODINE 10 % EX SWAB
2.0000 "application " | Freq: Once | CUTANEOUS | Status: AC
  Administered 2020-12-24: 2 via TOPICAL

## 2020-12-23 MED ORDER — SCOPOLAMINE 1 MG/3DAYS TD PT72
1.0000 | MEDICATED_PATCH | TRANSDERMAL | Status: DC
  Administered 2020-12-24: 1.5 mg via TRANSDERMAL
  Filled 2020-12-23: qty 1

## 2020-12-23 MED ORDER — ACETAMINOPHEN 500 MG PO TABS
1000.0000 mg | ORAL_TABLET | ORAL | Status: AC
  Administered 2020-12-24: 1000 mg via ORAL
  Filled 2020-12-23: qty 2

## 2020-12-23 MED ORDER — CHLORHEXIDINE GLUCONATE 0.12 % MT SOLN
15.0000 mL | Freq: Once | OROMUCOSAL | Status: DC
  Filled 2020-12-23: qty 15

## 2020-12-23 MED ORDER — LACTATED RINGERS IV SOLN: INTRAVENOUS | Status: DC

## 2020-12-23 MED ORDER — ORAL CARE MOUTH RINSE: 15.0000 mL | Freq: Once | OROMUCOSAL | Status: DC

## 2020-12-23 MED ORDER — SOD CITRATE-CITRIC ACID 500-334 MG/5ML PO SOLN
30.0000 mL | ORAL | Status: AC
  Administered 2020-12-24: 30 mL via ORAL
  Filled 2020-12-23: qty 15

## 2020-12-23 MED ORDER — BUPIVACAINE LIPOSOME 1.3 % IJ SUSP: Freq: Once | INTRAMUSCULAR | Status: DC

## 2020-12-24 ENCOUNTER — Encounter
Admission: RE | Disposition: A | Discharge status: Home / Self Care | Payer: Self-pay | Source: Home / Self Care | Attending: Obstetrics and Gynecology

## 2020-12-24 ENCOUNTER — Inpatient Hospital Stay: Payer: 59 | Admitting: Urgent Care

## 2020-12-24 ENCOUNTER — Inpatient Hospital Stay
Admission: RE | Admit: 2020-12-24 | Discharge: 2020-12-26 | DRG: 788 | Disposition: A | Discharge status: Home / Self Care | Payer: 59 | Attending: Obstetrics and Gynecology | Admitting: Obstetrics and Gynecology

## 2020-12-24 ENCOUNTER — Encounter: Payer: Self-pay | Admitting: Obstetrics and Gynecology

## 2020-12-24 ENCOUNTER — Other Ambulatory Visit: Payer: Self-pay

## 2020-12-24 DIAGNOSIS — Z98891 History of uterine scar from previous surgery: Secondary | ICD-10-CM

## 2020-12-24 DIAGNOSIS — Z885 Allergy status to narcotic agent status: Secondary | ICD-10-CM

## 2020-12-24 DIAGNOSIS — J45990 Exercise induced bronchospasm: Secondary | ICD-10-CM | POA: Diagnosis not present

## 2020-12-24 DIAGNOSIS — Z3A39 39 weeks gestation of pregnancy: Secondary | ICD-10-CM

## 2020-12-24 DIAGNOSIS — O34219 Maternal care for unspecified type scar from previous cesarean delivery: Secondary | ICD-10-CM | POA: Diagnosis present

## 2020-12-24 DIAGNOSIS — Z20822 Contact with and (suspected) exposure to covid-19: Secondary | ICD-10-CM | POA: Diagnosis present

## 2020-12-24 DIAGNOSIS — O34211 Maternal care for low transverse scar from previous cesarean delivery: Secondary | ICD-10-CM | POA: Diagnosis not present

## 2020-12-24 DIAGNOSIS — O09523 Supervision of elderly multigravida, third trimester: Secondary | ICD-10-CM | POA: Diagnosis not present

## 2020-12-24 DIAGNOSIS — O9952 Diseases of the respiratory system complicating childbirth: Secondary | ICD-10-CM | POA: Diagnosis present

## 2020-12-24 DIAGNOSIS — Z23 Encounter for immunization: Secondary | ICD-10-CM | POA: Diagnosis not present

## 2020-12-24 DIAGNOSIS — O24419 Gestational diabetes mellitus in pregnancy, unspecified control: Secondary | ICD-10-CM | POA: Diagnosis not present

## 2020-12-24 DIAGNOSIS — Z8632 Personal history of gestational diabetes: Secondary | ICD-10-CM | POA: Diagnosis present

## 2020-12-24 DIAGNOSIS — O09529 Supervision of elderly multigravida, unspecified trimester: Secondary | ICD-10-CM

## 2020-12-24 LAB — ABO/RH: ABO/RH(D): O NEG

## 2020-12-24 LAB — GLUCOSE, CAPILLARY: Glucose-Capillary: 91 mg/dL (ref 70–99)

## 2020-12-24 SURGERY — Surgical Case
Anesthesia: Spinal

## 2020-12-24 MED ORDER — OXYTOCIN-SODIUM CHLORIDE 30-0.9 UT/500ML-% IV SOLN
INTRAVENOUS | Status: DC | PRN
  Administered 2020-12-24: 300 mL via INTRAVENOUS

## 2020-12-24 MED ORDER — COCONUT OIL OIL
1.0000 "application " | TOPICAL_OIL | Status: DC | PRN
  Filled 2020-12-24: qty 120

## 2020-12-24 MED ORDER — NALBUPHINE HCL 10 MG/ML IJ SOLN
5.0000 mg | INTRAMUSCULAR | Status: DC | PRN
Start: 2020-12-24 — End: 2020-12-26

## 2020-12-24 MED ORDER — LACTATED RINGERS IV SOLN: INTRAVENOUS | Status: DC

## 2020-12-24 MED ORDER — KETOROLAC TROMETHAMINE 30 MG/ML IJ SOLN: 30.0000 mg | Freq: Four times a day (QID) | INTRAMUSCULAR | Status: AC | PRN

## 2020-12-24 MED ORDER — IBUPROFEN 800 MG PO TABS
800.0000 mg | ORAL_TABLET | Freq: Four times a day (QID) | ORAL | Status: DC
  Administered 2020-12-25 – 2020-12-26 (×4): 800 mg via ORAL
  Filled 2020-12-24 (×4): qty 1

## 2020-12-24 MED ORDER — KETOROLAC TROMETHAMINE 30 MG/ML IJ SOLN
INTRAMUSCULAR | Status: DC | PRN
  Administered 2020-12-24: 30 mg via INTRAVENOUS

## 2020-12-24 MED ORDER — AMMONIA AROMATIC IN INHA
RESPIRATORY_TRACT | Status: AC
  Filled 2020-12-24: qty 10

## 2020-12-24 MED ORDER — PHENYLEPHRINE HCL (PRESSORS) 10 MG/ML IV SOLN
INTRAVENOUS | Status: DC | PRN
  Administered 2020-12-24 (×2): 100 ug via INTRAVENOUS

## 2020-12-24 MED ORDER — MISOPROSTOL 200 MCG PO TABS
ORAL_TABLET | ORAL | Status: AC
  Filled 2020-12-24: qty 4

## 2020-12-24 MED ORDER — DIPHENHYDRAMINE HCL 50 MG/ML IJ SOLN: 12.5000 mg | INTRAMUSCULAR | Status: DC | PRN

## 2020-12-24 MED ORDER — NALOXONE HCL 0.4 MG/ML IJ SOLN: 0.4000 mg | INTRAMUSCULAR | Status: DC | PRN

## 2020-12-24 MED ORDER — MORPHINE SULFATE (PF) 0.5 MG/ML IJ SOLN
INTRAMUSCULAR | Status: DC | PRN
  Administered 2020-12-24: .1 mg via EPIDURAL

## 2020-12-24 MED ORDER — HYDROMORPHONE HCL 2 MG PO TABS: 2.0000 mg | ORAL_TABLET | ORAL | Status: DC | PRN

## 2020-12-24 MED ORDER — BUPIVACAINE LIPOSOME 1.3 % IJ SUSP
INTRAMUSCULAR | Status: AC
  Filled 2020-12-24: qty 20

## 2020-12-24 MED ORDER — SODIUM CHLORIDE 0.9 % IV SOLN
INTRAVENOUS | Status: DC | PRN
  Administered 2020-12-24: 50 ug/min via INTRAVENOUS

## 2020-12-24 MED ORDER — OXYTOCIN 10 UNIT/ML IJ SOLN
INTRAMUSCULAR | Status: AC
  Filled 2020-12-24: qty 2

## 2020-12-24 MED ORDER — SENNOSIDES-DOCUSATE SODIUM 8.6-50 MG PO TABS
2.0000 | ORAL_TABLET | Freq: Every day | ORAL | Status: DC
  Administered 2020-12-25 – 2020-12-26 (×2): 2 via ORAL
  Filled 2020-12-24 (×2): qty 2

## 2020-12-24 MED ORDER — OXYTOCIN-SODIUM CHLORIDE 30-0.9 UT/500ML-% IV SOLN
INTRAVENOUS | Status: AC
  Administered 2020-12-24: 2.5 [IU]/h via INTRAVENOUS
  Filled 2020-12-24: qty 1000

## 2020-12-24 MED ORDER — NALBUPHINE HCL 10 MG/ML IJ SOLN: 5.0000 mg | Freq: Once | INTRAMUSCULAR | Status: DC | PRN

## 2020-12-24 MED ORDER — PRENATAL MULTIVITAMIN CH
1.0000 | ORAL_TABLET | Freq: Every day | ORAL | Status: DC
  Administered 2020-12-24 – 2020-12-26 (×3): 1 via ORAL
  Filled 2020-12-24 (×3): qty 1

## 2020-12-24 MED ORDER — DIBUCAINE (PERIANAL) 1 % EX OINT: 1.0000 "application " | TOPICAL_OINTMENT | CUTANEOUS | Status: DC | PRN

## 2020-12-24 MED ORDER — ONDANSETRON HCL 4 MG/2ML IJ SOLN: 4.0000 mg | Freq: Once | INTRAMUSCULAR | Status: DC | PRN

## 2020-12-24 MED ORDER — BUPIVACAINE IN DEXTROSE 0.75-8.25 % IT SOLN
INTRATHECAL | Status: DC | PRN
  Administered 2020-12-24: 1.6 mL via INTRATHECAL

## 2020-12-24 MED ORDER — ZOLPIDEM TARTRATE 5 MG PO TABS: 5.0000 mg | ORAL_TABLET | Freq: Every evening | ORAL | Status: DC | PRN

## 2020-12-24 MED ORDER — TRAMADOL HCL 50 MG PO TABS: 50.0000 mg | ORAL_TABLET | Freq: Four times a day (QID) | ORAL | Status: DC | PRN

## 2020-12-24 MED ORDER — BUPIVACAINE HCL (PF) 0.5 % IJ SOLN
INTRAMUSCULAR | Status: AC
  Filled 2020-12-24: qty 30

## 2020-12-24 MED ORDER — KETOROLAC TROMETHAMINE 30 MG/ML IJ SOLN
30.0000 mg | Freq: Four times a day (QID) | INTRAMUSCULAR | Status: AC
  Administered 2020-12-24 – 2020-12-25 (×4): 30 mg via INTRAVENOUS
  Filled 2020-12-24 (×4): qty 1

## 2020-12-24 MED ORDER — DIPHENHYDRAMINE HCL 25 MG PO CAPS: 25.0000 mg | ORAL_CAPSULE | Freq: Four times a day (QID) | ORAL | Status: DC | PRN

## 2020-12-24 MED ORDER — SODIUM CHLORIDE 0.9% FLUSH: 3.0000 mL | INTRAVENOUS | Status: DC | PRN

## 2020-12-24 MED ORDER — MENTHOL 3 MG MT LOZG
1.0000 | LOZENGE | OROMUCOSAL | Status: DC | PRN
  Filled 2020-12-24: qty 9

## 2020-12-24 MED ORDER — KETOROLAC TROMETHAMINE 30 MG/ML IJ SOLN: 30.0000 mg | Freq: Once | INTRAMUSCULAR | Status: AC

## 2020-12-24 MED ORDER — SIMETHICONE 80 MG PO CHEW
80.0000 mg | CHEWABLE_TABLET | ORAL | Status: DC | PRN
  Administered 2020-12-25: 80 mg via ORAL

## 2020-12-24 MED ORDER — MORPHINE SULFATE (PF) 0.5 MG/ML IJ SOLN
INTRAMUSCULAR | Status: AC
  Filled 2020-12-24: qty 10

## 2020-12-24 MED ORDER — DIPHENHYDRAMINE HCL 25 MG PO CAPS: 25.0000 mg | ORAL_CAPSULE | ORAL | Status: DC | PRN

## 2020-12-24 MED ORDER — SODIUM CHLORIDE (PF) 0.9 % IJ SOLN
INTRAMUSCULAR | Status: AC
  Filled 2020-12-24: qty 50

## 2020-12-24 MED ORDER — BUPIVACAINE LIPOSOME 1.3 % IJ SUSP
INTRAMUSCULAR | Status: DC | PRN
  Administered 2020-12-24: 100 mL

## 2020-12-24 MED ORDER — ACETAMINOPHEN 500 MG PO TABS
1000.0000 mg | ORAL_TABLET | Freq: Four times a day (QID) | ORAL | Status: AC
  Administered 2020-12-24 – 2020-12-25 (×4): 1000 mg via ORAL
  Filled 2020-12-24 (×4): qty 2

## 2020-12-24 MED ORDER — OXYTOCIN-SODIUM CHLORIDE 30-0.9 UT/500ML-% IV SOLN: 2.5000 [IU]/h | INTRAVENOUS | Status: AC

## 2020-12-24 MED ORDER — DEXAMETHASONE SODIUM PHOSPHATE 10 MG/ML IJ SOLN
INTRAMUSCULAR | Status: DC | PRN
  Administered 2020-12-24: 10 mg via INTRAVENOUS

## 2020-12-24 MED ORDER — GABAPENTIN 300 MG PO CAPS
300.0000 mg | ORAL_CAPSULE | Freq: Two times a day (BID) | ORAL | Status: DC
  Administered 2020-12-24 – 2020-12-26 (×4): 300 mg via ORAL
  Filled 2020-12-24 (×5): qty 1

## 2020-12-24 MED ORDER — ONDANSETRON HCL 4 MG/2ML IJ SOLN: 4.0000 mg | Freq: Three times a day (TID) | INTRAMUSCULAR | Status: DC | PRN

## 2020-12-24 MED ORDER — NALBUPHINE HCL 10 MG/ML IJ SOLN: 5.0000 mg | INTRAMUSCULAR | Status: DC | PRN

## 2020-12-24 MED ORDER — FERROUS SULFATE 325 (65 FE) MG PO TABS
325.0000 mg | ORAL_TABLET | Freq: Two times a day (BID) | ORAL | Status: DC
  Administered 2020-12-24 – 2020-12-26 (×4): 325 mg via ORAL
  Filled 2020-12-24 (×4): qty 1

## 2020-12-24 MED ORDER — SODIUM CHLORIDE 0.9 % IV SOLN
150.0000 mg | Freq: Once | INTRAVENOUS | Status: AC
  Administered 2020-12-24: 150 mg via INTRAVENOUS
  Filled 2020-12-24: qty 5

## 2020-12-24 MED ORDER — DEXTROSE 5 % IV SOLN
1.0000 ug/kg/h | INTRAVENOUS | Status: DC | PRN
Start: 2020-12-24 — End: 2020-12-26
  Filled 2020-12-24: qty 5

## 2020-12-24 MED ORDER — ONDANSETRON HCL 4 MG/2ML IJ SOLN
INTRAMUSCULAR | Status: DC | PRN
  Administered 2020-12-24: 4 mg via INTRAVENOUS

## 2020-12-24 MED ORDER — MEPERIDINE HCL 25 MG/ML IJ SOLN: 6.2500 mg | INTRAMUSCULAR | Status: DC | PRN

## 2020-12-24 MED ORDER — FENTANYL CITRATE (PF) 100 MCG/2ML IJ SOLN: 25.0000 ug | INTRAMUSCULAR | Status: DC | PRN

## 2020-12-24 MED ORDER — MAGNESIUM HYDROXIDE 400 MG/5ML PO SUSP
30.0000 mL | ORAL | Status: DC | PRN
  Filled 2020-12-24: qty 30

## 2020-12-24 MED ORDER — WITCH HAZEL-GLYCERIN EX PADS: 1.0000 "application " | MEDICATED_PAD | CUTANEOUS | Status: DC | PRN

## 2020-12-24 SURGICAL SUPPLY — 35 items
APL PRP STRL LF DISP 70% ISPRP (MISCELLANEOUS) ×2
APL SKNCLS STERI-STRIP NONHPOA (GAUZE/BANDAGES/DRESSINGS) ×1
BAG COUNTER SPONGE EZ (MISCELLANEOUS) ×2 IMPLANT
BAG SPNG 4X4 CLR HAZ (MISCELLANEOUS) ×1
BENZOIN TINCTURE PRP APPL 2/3 (GAUZE/BANDAGES/DRESSINGS) ×1 IMPLANT
BINDER ABDOMINAL 12 ML 46-62 (SOFTGOODS) ×2 IMPLANT
CANISTER SUCT 3000ML PPV (MISCELLANEOUS) ×2 IMPLANT
CHLORAPREP W/TINT 26 (MISCELLANEOUS) ×4 IMPLANT
CLSR STERI-STRIP ANTIMIC 1/2X4 (GAUZE/BANDAGES/DRESSINGS) ×2 IMPLANT
COVER WAND RF STERILE (DRAPES) ×2 IMPLANT
DRSG OPSITE POSTOP 4X10 (GAUZE/BANDAGES/DRESSINGS) ×1 IMPLANT
DRSG TELFA 3X8 NADH (GAUZE/BANDAGES/DRESSINGS) ×2 IMPLANT
ELECT REM PT RETURN 9FT ADLT (ELECTROSURGICAL) ×2
ELECTRODE REM PT RTRN 9FT ADLT (ELECTROSURGICAL) ×1 IMPLANT
EXTRT SYSTEM ALEXIS 17CM (MISCELLANEOUS)
GAUZE SPONGE 4X4 12PLY STRL (GAUZE/BANDAGES/DRESSINGS) ×2 IMPLANT
GLOVE INDICATOR 7.0 STRL GRN (GLOVE) ×2 IMPLANT
GLOVE SURG ENC MOIS LTX SZ6.5 (GLOVE) ×2 IMPLANT
GOWN STRL REUS W/ TWL LRG LVL3 (GOWN DISPOSABLE) ×2 IMPLANT
GOWN STRL REUS W/TWL LRG LVL3 (GOWN DISPOSABLE) ×4
KIT TURNOVER KIT A (KITS) ×2 IMPLANT
MANIFOLD NEPTUNE II (INSTRUMENTS) ×2 IMPLANT
MAT PREVALON FULL STRYKER (MISCELLANEOUS) ×1 IMPLANT
NS IRRIG 1000ML POUR BTL (IV SOLUTION) ×2 IMPLANT
PACK C SECTION AR (MISCELLANEOUS) ×2 IMPLANT
PAD OB MATERNITY 4.3X12.25 (PERSONAL CARE ITEMS) ×2 IMPLANT
PAD PREP 24X41 OB/GYN DISP (PERSONAL CARE ITEMS) ×2 IMPLANT
PENCIL SMOKE ULTRAEVAC 22 CON (MISCELLANEOUS) ×2 IMPLANT
RTRCTR C-SECT PINK 25CM LRG (MISCELLANEOUS) ×1 IMPLANT
SUT MNCRL AB 4-0 PS2 18 (SUTURE) ×2 IMPLANT
SUT PLAIN 2 0 XLH (SUTURE) IMPLANT
SUT VIC AB 0 CT1 36 (SUTURE) ×8 IMPLANT
SUT VIC AB 3-0 SH 27 (SUTURE) ×2
SUT VIC AB 3-0 SH 27X BRD (SUTURE) ×1 IMPLANT
SYSTEM CONTND EXTRCTN KII BLLN (MISCELLANEOUS) IMPLANT

## 2020-12-24 NOTE — Anesthesia Procedure Notes (Signed)
Spinal  Patient location during procedure: OR Start time: 12/24/2020 7:50 AM End time: 12/24/2020 7:53 AM Staffing Performed: resident/CRNA  Anesthesiologist: Gunnar Fusi, MD Resident/CRNA: Aline Brochure, CRNA Preanesthetic Checklist Completed: patient identified, IV checked, site marked, risks and benefits discussed, surgical consent, monitors and equipment checked, pre-op evaluation and timeout performed Spinal Block Patient position: sitting Prep: ChloraPrep Patient monitoring: heart rate, continuous pulse ox, blood pressure and cardiac monitor Approach: midline Location: L3-4 Injection technique: single-shot Needle Needle type: Introducer and Pencan  Needle gauge: 24 G Needle length: 9 cm Assessment Sensory level: T10 Additional Notes Negative paresthesia. Negative blood return. Positive free-flowing CSF. Expiration date of kit checked and confirmed. Patient tolerated procedure well, without complications.

## 2020-12-24 NOTE — Anesthesia Preprocedure Evaluation (Addendum)
Anesthesia Evaluation  Patient identified by MRN, date of birth, ID band Patient awake    Reviewed: Allergy & Precautions, NPO status , Patient's Chart, lab work & pertinent test results  History of Anesthesia Complications (+) PONV and history of anesthetic complications  Airway Mallampati: I       Dental   Pulmonary asthma (no inhalers for years) , neg sleep apnea, neg COPD, Not current smoker,           Cardiovascular (-) hypertension(-) Past MI and (-) CHF (-) dysrhythmias (-) Valvular Problems/Murmurs     Neuro/Psych neg Seizures Anxiety    GI/Hepatic Neg liver ROS, neg GERD  ,  Endo/Other  diabetes, Gestational  Renal/GU negative Renal ROS     Musculoskeletal   Abdominal   Peds  Hematology  (+) anemia ,   Anesthesia Other Findings   Reproductive/Obstetrics                            Anesthesia Physical Anesthesia Plan  ASA: II  Anesthesia Plan: Spinal   Post-op Pain Management:    Induction:   PONV Risk Score and Plan:   Airway Management Planned:   Additional Equipment:   Intra-op Plan:   Post-operative Plan:   Informed Consent: I have reviewed the patients History and Physical, chart, labs and discussed the procedure including the risks, benefits and alternatives for the proposed anesthesia with the patient or authorized representative who has indicated his/her understanding and acceptance.       Plan Discussed with:   Anesthesia Plan Comments:         Anesthesia Quick Evaluation

## 2020-12-24 NOTE — H&P (Signed)
Obstetric Preoperative History and Physical  Diana Brooks is a 40 y.o. G2P1001 with IUP at [redacted]w[redacted]d presenting for presenting for scheduled repeat cesarean section. History of prior C-section x 1. Declines TOLAC.  No acute concerns.   Prenatal Course Source of Care: [facility] with onset of care at 9 weeks Pregnancy complications or risks: Patient Active Problem List   Diagnosis Date Noted  . Patient declines vaginal birth after cesarean section (VBAC) 12/24/2020  . Anemia of pregnancy in third trimester 11/10/2020  . History of cesarean delivery affecting pregnancy 06/27/2020  . Hx of migraines 06/27/2020  . Rubella non-immune status, antepartum 06/27/2020  . History of endometriosis 06/27/2020  . Mild exercise-induced asthma 01/23/2020  . BMI 25.0-25.9,adult 01/23/2020  . Hyperlipidemia LDL goal <100 01/23/2020  . History of gestational diabetes 01/23/2020  . History of squamous cell carcinoma excision 01/23/2020  . History of dysplastic nevus 01/23/2020  . Ganglion of wrist 01/22/2020  . Bilateral nipple discharge 02/27/2018  . Endometriosis 10/22/2015  . Infertility, female, secondary 10/22/2015  . Cyst of nipple 10/22/2015   She plans to breastfeed She desires undecided method for postpartum contraception.   Prenatal labs and studies: ABO, Rh: --/--/PENDING (02/03 1324) Antibody: NEG (01/31 1120) Rubella: 0.95 (07/13 1546) RPR: NON REACTIVE (01/31 1120)  HBsAg: Negative (07/13 1546)  HIV: Non Reactive (07/13 1546)  MWN:UUVOZDGU/-- (01/11 1603) 1 hr Glucola  Normal (borderline, 138) Genetic screening normal Anatomy US normal  Past Medical History:  Diagnosis Date  . Anemia   . Anxiety   . Asthma    exercise induced  . Complication of anesthesia   . Dysmenorrhea   . Endometriosis   . Family history of adverse reaction to anesthesia    N/V from mother  . GDM (gestational diabetes mellitus)   . Hyperprolactinemia ([place])   . IBS (irritable bowel  syndrome)   . Infertility, female   . PONV (postoperative nausea and vomiting)    states phenergan helps alot post op n/v    Past Surgical History:  Procedure Laterality Date  . CESAREAN SECTION  2013  . CHROMOPERTUBATION N/A 01/11/2016   Procedure: CHROMOPERTUBATION OF FALLOPIAN TUBES;  Surgeon: Brayton Mars, MD;  Location: [facility] ORS;  Service: Gynecology;  Laterality: N/A;  . CHROMOPERTUBATION  2012  . GANGLION CYST EXCISION Left    wrist  . LAPAROSCOPY     lap with excision of fulguration of endometriosis  . LAPAROSCOPY N/A 01/11/2016   Procedure: LAPAROSCOPY DIAGNOSTIC/ WITH EXCISION AND FULGERATION OF ENDOMETRIOSIS; MYOMECTOMY;  Surgeon: Brayton Mars, MD;  Location: [facility] ORS;  Service: Gynecology;  Laterality: N/A;    OB History  Gravida Para Term Preterm AB Living  2 1 1     1   SAB IAB Ectopic Multiple Live Births          1    # Outcome Date GA Lbr Len/2nd Weight Sex Delivery Anes PTL Lv  2 Current           1 Term 2013   3674 g M CS-LTranv   LIV    Social History   Socioeconomic History  . Marital status: Married    Spouse name: Thurmond Butts  . Number of children: 2  . Years of education: Not on file  . Highest education level: Not on file  Occupational History  . Not on file  Tobacco Use  . Smoking status: Never Smoker  . Smokeless tobacco: Never Used  Vaping Use  . Vaping Use:  Never used  Substance and Sexual Activity  . Alcohol use: Not Currently    Comment: rare  . Drug use: No  . Sexual activity: Not Currently    Birth control/protection: Other-see comments    Comment: undecided  Other Topics Concern  . Not on file  Social History Narrative  . Not on file   Social Determinants of Health   Financial Resource Strain: Not on file  Food Insecurity: Not on file  Transportation Needs: Not on file  Physical Activity: Not on file  Stress: Not on file  Social Connections: Not on file    Family History  Problem Relation Age of Onset  .  Endometriosis Mother   . Rheum arthritis Mother   . Diabetes Paternal Grandfather   . Prostate cancer Maternal Uncle   . Healthy Father   . Heart disease Neg Hx   . Breast cancer Neg Hx   . Ovarian cancer Neg Hx   . Colon cancer Neg Hx     Medications Prior to Admission  Medication Sig Dispense Refill Last Dose  . acetaminophen (TYLENOL) 500 MG tablet Take 500-1,000 mg by mouth every 6 (six) hours as needed (for pain.).   Past Week at Unknown time  . albuterol (VENTOLIN HFA) 108 (90 Base) MCG/ACT inhaler Inhale 1-2 puffs into the lungs every 6 (six) hours as needed for wheezing or shortness of breath. 6.7 g 1 12/23/2020 at Unknown time  . aspirin EC 81 MG tablet Take 1 tablet (81 mg total) by mouth daily. Take after 12 weeks for prevention of preeclampssia later in pregnancy (Patient taking differently: Take 81 mg by mouth every evening. Take after 12 weeks for prevention of preeclampssia later in pregnancy) 300 tablet 2   . Cholecalciferol (VITAMIN D-3) 125 MCG (5000 UT) TABS Take 5,000 Units by mouth 3 (three) times a week.   12/23/2020 at Unknown time  . ferrous sulfate 325 (65 FE) MG tablet Take 325 mg by mouth every other day. In the evening.   12/23/2020 at Unknown time  . Prenatal Vit-Fe Fumarate-FA (MULTIVITAMIN-PRENATAL) 27-0.8 MG TABS tablet Take 1 tablet by mouth every evening.   12/23/2020 at Unknown time  . ONETOUCH VERIO test strip USE AS DIRECTED 3-4 TIMES DAILY AS DIRECTED (Patient taking differently: 1 each by Other route as directed. 3-4 times daily) 100 each 12     Allergies  Allergen Reactions  . Codeine Nausea And Vomiting  . Levaquin [Levofloxacin In D5w] Other (See Comments)    Insomnia, fever, chills  . Other Other (See Comments)    General Anesthesia-severe nausea/vomiting  . Percocet [Oxycodone-Acetaminophen] Nausea And Vomiting  . Vicodin [Hydrocodone-Acetaminophen] Nausea And Vomiting  . Sulfa Antibiotics Rash and Other (See Comments)    Full body rash   . Suprax  [Cefixime] Rash    Review of Systems: Negative except for what is mentioned in HPI.  Physical Exam: BP 122/68   Pulse (!) 101   Temp 98.3 F (36.8 C) (Oral)   Resp 18   Ht 5\' 4"  (1.626 m)   Wt 77.1 kg   LMP 03/25/2020   BMI 29.18 kg/m  FHR by Doppler: 140 bpm GENERAL: Well-developed, well-nourished female in no acute distress.  LUNGS: Clear to auscultation bilaterally.  HEART: Regular rate and rhythm. ABDOMEN: Soft, nontender, nondistended, gravid, well-healed Pfannenstiel incision. PELVIC: Deferred EXTREMITIES: Nontender, no edema, 2+ distal pulses.   Pertinent Labs/Studies:   Results for orders placed or performed during the hospital encounter of 12/24/20 (from  the past 72 hour(s))  ABO/Rh     Status: None (Preliminary result)   Collection Time: 12/24/20  6:21 AM  Result Value Ref Range   ABO/RH(D) PENDING   Glucose, capillary     Status: None   Collection Time: 12/24/20  6:58 AM  Result Value Ref Range   Glucose-Capillary 91 70 - 99 mg/dL    Comment: Glucose reference range applies only to samples taken after fasting for at least 8 hours.    Assessment and Plan :GENNI BUSKE is a 40 y.o. G2P1001 at [redacted]w[redacted]d being admitted  for scheduled repeat cesarean section delivery . The patient is understanding of the planned procedure and is aware of and accepting of all surgical risks, including but not limited to: bleeding which may require transfusion or reoperation; infection which may require antibiotics; injury to bowel, bladder, ureters or other surrounding organs which may require repair; injury to the fetus; need for additional procedures including hysterectomy in the event of life-threatening complications; placental abnormalities wth subsequent pregnancies; incisional problems; blood clot disorders which may require blood thinners;, and other postoperative/anesthesia complications. The patient is in agreement with the proposed plan, and gives informed written consent for  the procedure. All questions have been answered.    Rubie Maid, MD [facility]

## 2020-12-24 NOTE — Op Note (Signed)
Cesarean Section Procedure Note  Indications: history of prior C-section x 1,  patient declines vag del attempt  Pre-operative Diagnosis: 39 week 1 day pregnancy.  Post-operative Diagnosis: Same  Surgeon: Rubie Maid, MD  Assistants:    Jeannie Fend, MD.  An experienced assistant was required given the standard of surgical care given the complexity of the case.  This assistant was needed for exposure, dissection, suctioning, retraction, instrument exchange, and for overall help during the procedure.  Procedure: Repeat low transverse Cesarean Section  Anesthesia: Spinal anesthesia  Procedure Details: The patient was seen in the Holding Room. The risks, benefits, complications, treatment options, and expected outcomes were discussed with the patient.  The patient concurred with the proposed plan, giving informed consent.  The site of surgery properly noted/marked. The patient was taken to the Operating Room, identified as Diana Brooks and the procedure verified as C-Section Delivery. A Time Out was held and the above information confirmed.  After induction of anesthesia, the patient was draped and prepped in the usual sterile manner. Anesthesia was tested and noted to be adequate. A Pfannenstiel incision was made and carried down through the subcutaneous tissue to the fascia. Fascial incision was made and extended transversely. The fascia was separated from the underlying rectus tissue superiorly and inferiorly. The peritoneum was identified and entered. Peritoneal incision was extended longitudinally. The surgical assist was able to provide retraction to allow for clear visualization of surgical site. The utero-vesical peritoneal reflection was incised transversely and the bladder flap was bluntly freed from the lower uterine segment. A low transverse uterine incision was made. Delivered from cephalic presentation was a 4300 gram Female with Apgar scores of 8 at one minute and 9 at five  minutes.  The assistant was able to apply adequate fundal pressure to allow for successful delivery of the fetus. The umbilical cord was clamped and cut. The placenta was removed intact and appeared normal. The uterus was exteriorized and cleared of all clots and debris. The uterine outline, tubes and ovaries appeared normal.  The uterine incision was closed with running locked sutures of 0-Vicryl.  A second suture of 0-Vicryl was used in an imbricating layer.  Hemostasis was observed. Lavage was carried out until clear. The fascia was then reapproximated with a running suture of 0-Vicryl. The incision was injected with 60 ml of 1.3% Exparel solution. The subcutaneous fat layer was reapproximated with 2-0 Vicryl. The skin was reapproximated with 4-0 Monocryl.  The skin was then injected with an additional 20 ml of the Exparel solution.   Instrument, sponge, and needle counts were correct prior the abdominal closure and at the conclusion of the case.   Findings: Female infant, cephalic presentation, 0017 grams, with Apgar scores of 8 at one minute and 9 at five minutes. Intact placenta with 3 vessel cord.  Clear amniotic fluid at amniotomy The uterine outline, tubes and ovaries appeared normal.   Estimated Blood Loss:  600 ml      Drains: foley catheter to gravity drainage, 100 ml of clear urine at end of the procedure         Total IV Fluids:  1000 ml  Specimens: None  Implants: None         Complications:  None; patient tolerated the procedure well.         Disposition: PACU - hemodynamically stable.         Condition: stable   Rubie Maid, MD [facility]

## 2020-12-24 NOTE — Anesthesia Postprocedure Evaluation (Signed)
Anesthesia Post Note  Patient: Diana Brooks  Procedure(s) Performed: CESAREAN SECTION (N/A )  Patient location during evaluation: L&D Anesthesia Type: Spinal Level of consciousness: awake Pain management: pain level controlled Vital Signs Assessment: post-procedure vital signs reviewed and stable Respiratory status: spontaneous breathing Cardiovascular status: stable Postop Assessment: no apparent nausea or vomiting Anesthetic complications: no   No complications documented.   Last Vitals:  Vitals:   12/24/20 1116 12/24/20 1300  BP:  (!) 105/57  Pulse:  67  Resp: 15 18  Temp:  36.6 C  SpO2:  98%    Last Pain:  Vitals:   12/24/20 1300  TempSrc: Axillary  PainSc: 0-No pain                 Neva Seat

## 2020-12-24 NOTE — Lactation Note (Signed)
This note was copied from a baby's chart. Lactation Consultation Note  Patient Name: Diana Brooks OVFIE'P Date: 12/24/2020 Reason for consult: L&D Initial assessment;Term;Other (Comment) (LGA, c/section) Age:38 hours  Initial lactation assessment. G2P2 mom delivered via c-section 2 hours prior to visit to [place] baby with stable glucose levels thus far. Transition RN assisted with first breastfeed, mom anxious, unsure about baby getting enough. LC in room to introduce herself, answer questions, and provide education to mom and dad while baby is sleeping soundly now on mom's chest. LC reviewed newborn stomach size, feeding behaviors and patterns, output expectations, signs of good intake, and hand expression. LC demonstrated hand expression on L breast; colostrum easily expressed, and mom was able to re-teach and produce colostrum with hand expression. LC encouraged 8-12 feedings/attempts within first 24 hours, hand expression between feedings or if baby is sleepy and doesn't feed. Encouraged ongoing skin to skin, and discussed benefits of baby acclimating and helping maintain temperature and glucose levels. All questions answered at this time, LC to follow-up once moved to [facility].  Maternal Data Has patient been taught Hand Expression?: Yes Does the patient have breastfeeding experience prior to this delivery?: Yes How long did the patient breastfeed?: 60yr  Feeding Mother's Current Feeding Choice: Breast Milk  LATCH Score                    Lactation Tools Discussed/Used    Interventions Interventions: Breast feeding basics reviewed;Education;Hand express  Discharge    Consult Status Consult Status: Follow-up Date: 12/24/20 Follow-up type: [place] 12/24/2020, 11:09 AM

## 2020-12-24 NOTE — Transfer of Care (Signed)
Immediate Anesthesia Transfer of Care Note  Patient: Diana Brooks  Procedure(s) Performed: CESAREAN SECTION (N/A )  Patient Location: LDR 6  Anesthesia Type:Spinal  Level of Consciousness: awake, alert  and oriented  Airway & Oxygen Therapy: Patient Spontanous Breathing  Post-op Assessment: Post -op Vital signs reviewed and stable  Post vital signs: stable  Last Vitals:  Vitals Value Taken Time  BP 96/54   Temp 97.45f   Pulse 90   Resp 12   SpO2 100     Last Pain:  Vitals:   12/24/20 0605  TempSrc: Oral  PainSc: 0-No pain         Complications: No complications documented.

## 2020-12-25 DIAGNOSIS — O09529 Supervision of elderly multigravida, unspecified trimester: Secondary | ICD-10-CM

## 2020-12-25 LAB — CBC
HCT: 31.4 % — ABNORMAL LOW (ref 36.0–46.0)
Hemoglobin: 10.8 g/dL — ABNORMAL LOW (ref 12.0–15.0)
MCH: 32.2 pg (ref 26.0–34.0)
MCHC: 34.4 g/dL (ref 30.0–36.0)
MCV: 93.7 fL (ref 80.0–100.0)
Platelets: 122 10*3/uL — ABNORMAL LOW (ref 150–400)
RBC: 3.35 MIL/uL — ABNORMAL LOW (ref 3.87–5.11)
RDW: 14.9 % (ref 11.5–15.5)
WBC: 14.7 10*3/uL — ABNORMAL HIGH (ref 4.0–10.5)
nRBC: 0 % (ref 0.0–0.2)

## 2020-12-25 LAB — ANTIBODY SCREEN: Antibody Screen: NEGATIVE

## 2020-12-25 LAB — ABO/RH: ABO/RH(D): O NEG

## 2020-12-25 LAB — FETAL SCREEN: Fetal Screen: NEGATIVE

## 2020-12-25 MED ORDER — ACETAMINOPHEN 500 MG PO TABS
1000.0000 mg | ORAL_TABLET | Freq: Four times a day (QID) | ORAL | Status: DC | PRN
  Administered 2020-12-25 – 2020-12-26 (×3): 1000 mg via ORAL
  Filled 2020-12-25 (×3): qty 2

## 2020-12-25 MED ORDER — MEASLES, MUMPS & RUBELLA VAC IJ SOLR
0.5000 mL | Freq: Once | INTRAMUSCULAR | Status: AC
  Administered 2020-12-26: 0.5 mL via SUBCUTANEOUS
  Filled 2020-12-25 (×2): qty 0.5

## 2020-12-25 MED ORDER — IBUPROFEN 800 MG PO TABS: 800.0000 mg | ORAL_TABLET | Freq: Four times a day (QID) | ORAL | 1 refills | Status: DC

## 2020-12-25 MED ORDER — TRAMADOL HCL 50 MG PO TABS: 50.0000 mg | ORAL_TABLET | Freq: Four times a day (QID) | ORAL | 0 refills | Status: DC | PRN

## 2020-12-25 MED ORDER — RHO D IMMUNE GLOBULIN 1500 UNIT/2ML IJ SOSY
300.0000 ug | PREFILLED_SYRINGE | Freq: Once | INTRAMUSCULAR | Status: AC
  Administered 2020-12-25: 300 ug via INTRAVENOUS
  Filled 2020-12-25: qty 2

## 2020-12-25 NOTE — Lactation Note (Signed)
This note was copied from a baby's chart. Lactation Consultation Note  Patient Name: Diana Brooks Date: 12/25/2020 Reason for consult: Follow-up assessment Age:38 hours  Maternal Data  Nipples sl tender with initial latch, discussed flanging lips, chin pressure to widen latch, coconut oil given with instruction in use.  Feeding Mother's Current Feeding Choice: Breast Milk Mom nursing baby on right breast in cradle hold.  LATCH Score Latch: Grasps breast easily, tongue down, lips flanged, rhythmical sucking.  Audible Swallowing: Spontaneous and intermittent  Type of Nipple: Everted at rest and after stimulation  Comfort (Breast/Nipple): Filling, red/small blisters or bruises, mild/mod discomfort  Hold (Positioning): No assistance needed to correctly position infant at breast.  LATCH Score: 9   Lactation Tools Discussed/Used    Interventions Interventions: Adjust position;Comfort gels  Discharge Discharge Education: Other (comment) (Breastfeeding resources and support group info)  Consult Status Consult Status: Follow-up Date: 12/26/20 Follow-up type: In-patient    Ferol Luz 12/25/2020, 5:34 PM

## 2020-12-25 NOTE — Lactation Note (Signed)
This note was copied from a baby's chart. Lactation Consultation Note  Patient Name: Diana Brooks [id number] Date: 12/25/2020 Reason for consult: Follow-up assessment;Term Age:38 hours  Maternal Data Does the patient have breastfeeding experience prior to this delivery?: Yes How long did the patient breastfeed?: 1 yr  Feeding Mother's Current Feeding Choice: Breast Milk Breast fed recently, baby sucking on a pacifier, informed mom that she might want to attempt breastfeeding again as babies frequently cluster feed and that this encouraged milk production  LATCH Score Latch:  (I did not observe a feeding)                  Lactation Tools Discussed/Used  [place] name and no written on white board   Interventions Interventions: Education (about frequent feedings, gassy baby)  Discharge Pump: Personal [place] Program: No  Consult Status Consult Status: PRN Date: 12/25/20 Follow-up type: In-patient    Ferol Luz 12/25/2020, 11:41 AM

## 2020-12-25 NOTE — Progress Notes (Addendum)
Postpartum Day # 1: Cesarean Delivery  Subjective: Patient reports tolerating PO and no problems voiding. Notes minimal pain. Tolerating PO. Breastfeeding going well so far.  She does report some soreness in throat/chest.   Objective: Vital signs in last 24 hours: Temp:  [97.7 F (36.5 C)-98.1 F (36.7 C)] 97.7 F (36.5 C) (02/04 0806) Pulse Rate:  [60-89] 75 (02/04 0806) Resp:  [10-29] 18 (02/04 0806) BP: (91-112)/(45-60) 96/56 (02/04 0806) SpO2:  [91 %-99 %] 99 % (02/04 0806)  Physical Exam:  General: alert and no distress Lungs: clear to auscultation bilaterally Breasts: normal appearance, no masses or tenderness Heart: regular rate and rhythm, S1, S2 normal, no murmur, click, rub or gallop Abdomen: soft, non-tender; bowel sounds normal; no masses,  no organomegaly Pelvis: Lochia appropriate, Uterine Fundus firm, Incision: healing well, no significant drainage, no dehiscence, no significant erythema Extremities: DVT Evaluation: No evidence of DVT seen on physical exam. Negative Homan's sign. No cords or calf tenderness. No significant calf/ankle edema.   Labs:  CBC Latest Ref Rng & Units 12/25/2020 12/21/2020 09/23/2020  WBC 4.0 - 10.5 K/uL 14.7(H) 6.8 7.5  Hemoglobin 12.0 - 15.0 g/dL 10.8(L) 11.9(L) 10.4(L)  Hematocrit 36.0 - 46.0 % 31.4(L) 33.3(L) 31.1(L)  Platelets 150 - 400 K/uL 122(L) 137(L) 184     Assessment/Plan: Status post Cesarean section. Doing well postoperatively.  Breastfeeding, Lactation consult  Contraception OCPs Routine post-op care (regular diet, encourage ambulation, use of incentive spirometer) Continue PO pain management Continue current care. Likely d/c home in a.m.  To inform if throat/chest soreness worsens, or develops a cough, SOB, or fever.  Rubella non-immune, for MMR postpartum. Dispo: likely d/c home in a.m.    Diana Maid, MD [facility]

## 2020-12-25 NOTE — Discharge Summary (Signed)
Postpartum Discharge Summary      Patient Name: Diana Brooks DOB: October 01, 1981 MRN: [id number]  Date of admission: 12/24/2020 Delivery date:12/24/2020  Delivering provider: Rubie Maid  Date of discharge:  12/26/2020  Admitting diagnosis: S/P cesarean section [Z98.891] Intrauterine pregnancy: [redacted]w[redacted]d    Secondary diagnosis:  Principal Problem:   Patient declines vaginal birth after cesarean section (VBAC) Active Problems:   History of gestational diabetes   History of cesarean delivery affecting pregnancy   S/P cesarean section  Additional problems: Advanced maternal age    Discharge diagnosis: Term Pregnancy Delivered                                              Post partum procedures:None Augmentation: N/A Complications: None  Hospital course: Sceduled C/S   40y.o. yo G2P2002 at 358w1das admitted to the hospital 12/24/2020 for scheduled cesarean section with the following indication:Elective Repeat.Delivery details are as follows:  Membrane Rupture Time/Date: 8:12 AM ,12/24/2020   Delivery Method:C-Section, Low Transverse  Details of operation can be found in separate operative note.  Patient had an uncomplicated postpartum course.  She is ambulating, tolerating a regular diet, passing flatus, and urinating well. Patient is discharged home in stable condition on  12/26/2020.         Newborn Data: Birth date:12/24/2020  Birth time:8:13 AM  Gender:Female  Living status:Living  Apgars:8 ,9  Weight:4300 g     Magnesium Sulfate received: No BMZ received: No Rhophylac:No MMR:Yes T-DaP:Given prenatally Flu: Received prenatally Transfusion:No  Physical exam  Vitals:   12/25/20 0806 12/25/20 1130 12/25/20 2307 12/26/20 0839  BP: (!) 96/56 (!) 104/55 (!) 93/53 106/66  Pulse: 75 77 68 95  Resp: _0 Temp: 97.7 F (36.5 C) 97.8 F (36.6 C) 98 F (36.7 C) 98.5 F (36.9 C)  TempSrc: Oral Oral Oral Oral  SpO2: 99%  97% 95%  Weight:      Height:       General:  alert, cooperative and no distress Lochia: appropriate Uterine Fundus: firm Incision: Healing well with no significant drainage DVT Evaluation: No evidence of DVT seen on physical exam. Negative Homan's sign. No cords or calf tenderness. No significant calf/ankle edema. Labs: Lab Results  Component Value Date   WBC 14.7 (H) 12/25/2020   HGB 10.8 (L) 12/25/2020   HCT 31.4 (L) 12/25/2020   MCV 93.7 12/25/2020   PLT 122 (L) 12/25/2020   CMP Latest Ref Rng & Units 01/17/2020  Glucose 65 - 99 mg/dL 86  BUN 6 - 20 mg/dL 9  Creatinine 0.57 - 1.00 mg/dL 0.85  Sodium 134 - 144 mmol/L 140  Potassium 3.5 - 5.2 mmol/L 4.3  Chloride 96 - 106 mmol/L 104  Calcium 8.7 - 10.2 mg/dL 9.2  Total Protein 6.0 - 8.5 g/dL 7.0  Total Bilirubin 0.0 - 1.2 mg/dL 0.4  Alkaline Phos 39 - 117 IU/L 80  AST 0 - 40 IU/L 24  ALT 0 - 32 IU/L 22   Edinburgh Score: Edinburgh Postnatal Depression Scale Screening Tool 12/26/2020  I have been able to laugh and see the funny side of things. 0  I have looked forward with enjoyment to things. 0  I have blamed myself unnecessarily when things went wrong. 0  I have been anxious or worried for no good reason. 0  I  have felt scared or panicky for no good reason. 1  Things have been getting on top of me. 0  I have been so unhappy that I have had difficulty sleeping. 0  I have felt sad or miserable. 0  I have been so unhappy that I have been crying. 0  The thought of harming myself has occurred to me. 0  Edinburgh Postnatal Depression Scale Total 1      After visit meds:  Allergies as of 12/25/2020      Reactions   Codeine Nausea And Vomiting   Levaquin [levofloxacin In D5w] Other (See Comments)   Insomnia, fever, chills   Other Other (See Comments)   General Anesthesia-severe nausea/vomiting   Percocet [oxycodone-acetaminophen] Nausea And Vomiting   Vicodin [hydrocodone-acetaminophen] Nausea And Vomiting   Sulfa Antibiotics Rash, Other (See Comments)   Full  body rash   Suprax [cefixime] Rash      Medication List    STOP taking these medications   aspirin EC 81 MG tablet   ferrous sulfate 325 (65 FE) MG tablet   OneTouch Verio test strip Generic drug: glucose blood     TAKE these medications   acetaminophen 500 MG tablet Commonly known as: TYLENOL Take 500-1,000 mg by mouth every 6 (six) hours as needed (for pain.).   albuterol 108 (90 Base) MCG/ACT inhaler Commonly known as: VENTOLIN HFA Inhale 1-2 puffs into the lungs every 6 (six) hours as needed for wheezing or shortness of breath.   ibuprofen 800 MG tablet Commonly known as: ADVIL Take 1 tablet (800 mg total) by mouth every 6 (six) hours.   multivitamin-prenatal 27-0.8 MG Tabs tablet Take 1 tablet by mouth every evening.   traMADol 50 MG tablet Commonly known as: Ultram Take 1 tablet (50 mg total) by mouth every 6 (six) hours as needed for moderate pain or severe pain (May take 2 tablets every 6 hours prn for severe pain).   Vitamin D-3 125 MCG (5000 UT) Tabs Take 5,000 Units by mouth 3 (three) times a week.        Discharge home in stable condition Infant Feeding: Breast Infant Disposition:home with mother Discharge instruction: per After Visit Summary and Postpartum booklet. Activity: Advance as tolerated. Pelvic rest for 6 weeks.  Diet: routine diet Anticipated Birth Control: OCPs Postpartum Appointment:6 weeks Additional Postpartum F/U: None Future Appointments: Future Appointments  Date Time Provider [place]  03/18/2021  8:00 AM Rubie Maid, MD [facility] None   Follow up Visit:  Follow-up Information    Rubie Maid, MD Follow up.   Specialties: Obstetrics and Gynecology, Radiology Why: 1 week for incision check 6 weeks for postpartum check Contact information: [address] [place] [address] [phone]                   12/28/2020 Rubie Maid, MD

## 2020-12-26 ENCOUNTER — Other Ambulatory Visit: Payer: Self-pay | Admitting: Obstetrics and Gynecology

## 2020-12-26 LAB — RHOGAM INJECTION: Unit division: 0

## 2020-12-26 MED ORDER — IBUPROFEN 800 MG PO TABS: 800.0000 mg | ORAL_TABLET | Freq: Four times a day (QID) | ORAL | 1 refills | Status: DC

## 2020-12-26 MED ORDER — TRAMADOL HCL 50 MG PO TABS: 50.0000 mg | ORAL_TABLET | Freq: Four times a day (QID) | ORAL | 0 refills | Status: DC | PRN

## 2020-12-26 NOTE — Lactation Note (Signed)
This note was copied from a baby's chart. Lactation Consultation Note  Patient Name: Girl Shakeitha Umbaugh GGYIR'S Date: 12/26/2020 Reason for consult: Follow-up assessment;Infant weight loss Age:38 hours  LC visited parents and infant Raylan on day of d/c. Per mom, Raylan is demonstrating feeding cues and breastfeeding often. Mother denies difficulty with positioning/latch. She feels a strong tug during the feeding and is aware that she can open Raylan's mouth wider by gently pulling her chin to improve the latch / milk transfer prn.  Raylan has a 10% weight loss at 49 hours of life. We discussed the possible causes and potential warning signs of inadequate intake in the first few days of life. It is reassuring that Raylan's rate of weight loss decreased in the last 24-hours. She also has adequate voids/stools and demonstrates appropriate feeding interest. Mother provided a supplementation overnight, per her choice. We reviewed feeding and output expectations between d/c and Raylan's ped's appointment on Tuesday. LC encouraged mom to consider an infant weight check Sunday or Monday and provided community resources. Mother has a breast pump at home for use prn. Parents were provided with the opportunity to ask questions. All concerns were addressed.    Feeding Mother's Current Feeding Choice: Breast Milk   Consult Status Scheduled for d/c today. Consult Status: PRN Follow-up type: [place], [place] IBCLC 12/26/2020, 9:55 AM

## 2020-12-26 NOTE — Discharge Instructions (Signed)
Perinatal Depression When a woman feels excessive sadness, anger, or anxiety during pregnancy or during the first 12 months after she gives birth, she has a condition called perinatal depression. This can interfere with work, school, relationships, and other everyday activities. If it is not managed properly, it can also interfere with the woman's ability to take care of the baby. Symptoms of perinatal depression may feel worse when living with a newborn. Sometimes, these symptoms are left untreated because they are thought to be normal mood swings during and right after pregnancy. However, if you have intense symptoms of depression that last for more than 2 weeks, it is important to talk with your health care provider. This may be perinatal depression. What are the causes? The exact cause of this condition is not known. Hormonal changes during and after pregnancy may play a role in causing perinatal depression. What increases the risk? You are more likely to develop this condition if:  You have a personal or family history of depression, anxiety, or mood disorders.  You experience a stressful life event during pregnancy, such as the death of a loved one.  You have additional life stress, such as being a single parent.  You do not have support from family members or loved ones, or you are in an abusive relationship.  You have thyroid problems. What are the signs or symptoms? Symptoms of this condition include:  Emotional symptoms, such as: ? Feeling sad or hopeless. ? Feelings of guilt. ? Feeling irritable or overwhelmed.  Physical symptoms, such as: ? Changes with appetite or sleep. ? Lack of energy or motivation. ? Persistent headaches or stomach problems.  Behavioral symptoms, such as: ? Difficulty concentrating or completing tasks. ? Loss of interest in hobbies or relationships. How is this diagnosed? This condition is diagnosed based on a physical exam and mental  evaluation.  In some cases, your health care provider may use a depression screening tool. This includes a list of questions that can help a health care provider diagnose depression.  You may be referred to a mental health expert who specializes in treating perinatal depression. How is this treated? This condition may be treated with:  Talk therapy with a mental health professional. This may be interpersonal psychotherapy, couples therapy, cognitive behavioral therapy, or mother-child bonding therapy.  Medicines. Your health care provider will discuss the safety of the medicines prescribed during pregnancy and breastfeeding.  Support groups.  Brain stimulation or light therapies.  Stress reduction therapies, such as mindfulness.   Follow these instructions at home: Lifestyle  Do not use any products that contain nicotine or tobacco. These products include cigarettes, chewing tobacco, and vaping devices, such as e-cigarettes. If you need help quitting, ask your health care provider.  Do not drink alcohol when you are pregnant. It is also safest not to drink alcohol if you are breastfeeding.  After your baby is born, if you drink alcohol: ? Limit how much you have to 0-1 drink a day. ? Be aware of how much alcohol is in your drink. In the [place] one drink equals one 12 oz bottle of beer (355 mL), one 5 oz glass of wine (148 mL), or one 1 oz glass of hard liquor (44 mL).  Consider joining a support group for new mothers. Ask your health care provider for recommendations.  Take good care of yourself. Make sure you: ? Get as much sleep as possible. Talk with your partner about sharing the responsibility of getting up with   your baby if possible and sharing child care responsibilities equally. Make sleep a priority. ? Eat a healthy diet. This includes plenty of fruits and vegetables, whole grains, and lean proteins. ? Exercise regularly, as told by your health care provider. Ask your health  care provider what exercises are safe for you. Talk with your partner about making sure you both have opportunities to exercise. General instructions  Take over-the-counter and prescription medicines only as told by your health care provider.  Talk with your partner or family members about your feelings during pregnancy. Share any concerns, needs, or anxieties that you may have. Do not be afraid to ask for help. Find a mental health professional, if needed.  Ask for help with tasks or chores when you need it. Ask friends and family members to provide meals, watch your children, or help with cleaning.  Keep all follow-up visits. This is important. Contact a health care provider if:  You or people close to you notice that you have symptoms of depression.  Your symptoms of depression get worse.  You take medicines and have side effects, such as nausea or sleep problems. Get help right away if:  You feel like hurting yourself, your baby, or someone else. If you feel like you may hurt yourself or others, or have thoughts about taking your own life, get help right away. You can go to your nearest emergency department or:  Call your local emergency services (911 in the [place]  Call a suicide crisis helpline, such as the National Suicide Prevention Lifeline, at 1-800-273-8255. This is open 24 hours a day in the [place]  Text the Crisis Text Line at 741741 (in the [place] Summary  Perinatal depression is when a woman feels excessive sadness, anger, or anxiety during pregnancy or during the first 12 months after she gives birth.  If perinatal depression is not managed properly, it can interfere with the woman's ability to take care of the baby.  This condition is treated with medicines, talk therapy, stress reduction therapies, or a combination of treatments.  Talk with your partner or family members about your feelings. Ask for help when you need it. This information is not intended to replace  advice given to you by your health care provider. Make sure you discuss any questions you have with your health care provider. Document Revised: 05/01/2020 Document Reviewed: 05/01/2020 Elsevier Patient Education  2021 [employer]vier Inc.  

## 2020-12-26 NOTE — Progress Notes (Signed)
Progress Note - Cesarean Delivery  Diana Brooks is a 40 y.o. G2P2002 now PP day 2 s/p C-Section, Low Transverse .   Subjective:  Patient reports no problems with eating, bowel movements, voiding, or their wound    Objective:  Vital signs in last 24 hours: Temp:  [97.8 F (36.6 C)-98.5 F (36.9 C)] 98.5 F (36.9 C) (02/05 0839) Pulse Rate:  [68-95] 95 (02/05 0839) Resp:  [18] 18 (02/05 0839) BP: (93-106)/(53-66) 106/66 (02/05 0839) SpO2:  [95 %-97 %] 95 % (02/05 0839)  Physical Exam:  General: alert, cooperative and no distress Lochia: appropriate Uterine Fundus: firm Incision: honeycomb intact    Data Review Recent Labs    12/25/20 0634  HGB 10.8*  HCT 31.4*    Assessment:  Principal Problem:   Patient declines vaginal birth after cesarean section (VBAC) Active Problems:   History of gestational diabetes   History of cesarean delivery affecting pregnancy   S/P cesarean section   Advanced maternal age in multigravida   Status post Cesarean section. Doing well postoperatively.     Plan:       Discharge home with standard precautions and return to clinic in 1 weeks.    Finis Bud, M.D. 12/26/2020 9:22 AM

## 2020-12-26 NOTE — Progress Notes (Signed)
Pt discharged with infant.  Discharge instructions, prescriptions and follow up appointment given to and reviewed with pt. Pt verbalized understanding. Escorted out by auxillary. 

## 2020-12-29 ENCOUNTER — Encounter: Payer: Self-pay | Admitting: Obstetrics and Gynecology

## 2020-12-29 ENCOUNTER — Ambulatory Visit (INDEPENDENT_AMBULATORY_CARE_PROVIDER_SITE_OTHER): Payer: 59 | Admitting: Obstetrics and Gynecology

## 2020-12-29 ENCOUNTER — Other Ambulatory Visit: Payer: Self-pay

## 2020-12-29 VITALS — BP 96/62 | HR 91 | Ht 64.0 in | Wt 162.4 lb

## 2020-12-29 DIAGNOSIS — Z98891 History of uterine scar from previous surgery: Secondary | ICD-10-CM

## 2020-12-29 DIAGNOSIS — Z09 Encounter for follow-up examination after completed treatment for conditions other than malignant neoplasm: Secondary | ICD-10-CM

## 2020-12-29 NOTE — Progress Notes (Signed)
    OBSTETRICS/GYNECOLOGY POST-OPERATIVE CLINIC VISIT  Subjective:     Diana Brooks is a 40 y.o. female who presents to the clinic 5 days postpartum status post repeat low transverse C-section for desired elective repeat. Eating a regular diet without difficulty. Bowel movements are normal. Pain is controlled with current analgesics. Medications being used: prescription NSAID's including ibuprofen (Motrin) and narcotic analgesics including tramadol (Ultram).  She reports concerns that she may have "overdone it" when attempting to lift her baby out of the car seat the other day, and was concerned that she may have strained a muscle in her abdomen at her incision site. Notes incision does not feel open, nor has she noted any excess drainage. Reports breastfeeding is going well. Bleeding is normal.   The following portions of the patient's history were reviewed and updated as appropriate: allergies, current medications, past family history, past medical history, past social history, past surgical history and problem list.  Review of Systems Review of systems not obtained due to patient factors.    Objective:    BP 96/62   Pulse 91   Ht 5\' 4"  (1.626 m)   Wt 162 lb 6.4 oz (73.7 kg)   Breastfeeding Yes   BMI 27.88 kg/m  General:  alert and no distress  Abdomen: soft, bowel sounds active, non-tender  Incision:   Honeycomb dressing removed, incision noted to be healing well, no drainage, no erythema, no hernia, no seroma, no swelling, no dehiscence, and well approximated. Steri-strips in place.     Pathology:  None  Assessment:    Doing well postoperatively. S/p Cesarean section   Plan:   1. Continue any current medications. 2. Wound care discussed. 3. Operative findings again reviewed. Pathology report discussed. 4. Activity restrictions: no excessive bending, stooping, or squatting and no lifting more than 10-15 pounds 5. Anticipated return to work: 5-8 weeks. 6. Follow up: 5  weeks for postpartum visit    Rubie Maid, MD [facility]

## 2020-12-29 NOTE — Progress Notes (Signed)
Pt present for post op visit after c-section.  Pt stated having nerve pain in the incision area.

## 2020-12-30 ENCOUNTER — Encounter: Payer: Self-pay | Admitting: Obstetrics and Gynecology

## 2021-01-15 ENCOUNTER — Telehealth: Payer: Self-pay | Admitting: Lactation Services

## 2021-01-15 ENCOUNTER — Encounter: Payer: Self-pay | Admitting: Obstetrics and Gynecology

## 2021-01-15 ENCOUNTER — Telehealth: Payer: Self-pay

## 2021-01-15 ENCOUNTER — Other Ambulatory Visit: Payer: Self-pay

## 2021-01-15 ENCOUNTER — Ambulatory Visit (INDEPENDENT_AMBULATORY_CARE_PROVIDER_SITE_OTHER): Payer: 59 | Admitting: Obstetrics and Gynecology

## 2021-01-15 VITALS — BP 111/75 | HR 75 | Ht 64.0 in | Wt 156.4 lb

## 2021-01-15 DIAGNOSIS — O9270 Unspecified disorders of lactation: Secondary | ICD-10-CM

## 2021-01-15 MED ORDER — DICLOXACILLIN SODIUM 500 MG PO CAPS: 500.0000 mg | ORAL_CAPSULE | Freq: Four times a day (QID) | ORAL | 0 refills | Status: AC

## 2021-01-15 NOTE — Telephone Encounter (Signed)
Pt called no answer LM via VM that I had placed her on [facility] schedule for 2:45pm for breast exam due to her breastfeeding issues. Pt was advised to please contact the office if she was unable to keep that appointment.

## 2021-01-15 NOTE — Patient Instructions (Addendum)
Breastfeeding Tips for a Good Latch Breastfeeding can be challenging, especially during the first few weeks after childbirth. It is normal to have some problems when you start to breastfeed your new baby, even if you have breastfed before. Latching is an important part of having a good breastfeeding experience. This refers to how your baby's mouth attaches to your nipple to breastfeed. Your baby may have trouble latching due to:  Poor positioning.  Nipple confusion. This can occur if you introduce a bottle or pacifier too early.  Abnormalities in your baby's mouth, tongue, or lips. This includes conditions like tongue-tie or cleft lip.  The shape of your nipples, such as nipples that are flat or turned in (inverted).  Your baby being born early (prematurely). Small babies often have a weak suck.  Breasts becoming overfilled with milk (engorged breasts). Breasts that are too full can make a latch difficult. If breasts are engorged, express a little milk to help soften the breast. Work with a breastfeeding specialist (Science writer) to find positions and strategies that will help make sure your baby has a good latch. How does this affect me? A poor latch may cause you to have problems such as:  Cracked or sore nipples.  Engorged breasts.  Plugged milk ducts.  Low milk supply.  Breast inflammation or infection. How does this affect my baby? A poor latch may cause your baby to not be able to feed effectively. As a result, he or she may have trouble gaining weight. Follow these instructions at home: How to position your baby  Find a comfortable place to sit or lie down, with your neck and back well supported.  If you are seated, place a pillow or rolled-up blanket under your baby to bring him or her to the level of your breast.  Make sure that your baby's belly is facing your abdomen.  Try different positions to find one that works best for you and your baby. How to help your  baby latch To begin each breastfeeding session, you may find it helpful to gently massage your breast. With your fingertips, massage from your chest wall toward your nipple in a circular motion. This encourages milk flow. If your milk flows slowly, you may need to continue this action during feeding. Position your breast for an ideal latch. Support your breast with four fingers underneath and your thumb above your nipple. Keep your fingers away from your nipple and your baby's mouth. To help your baby latch, follow these steps: 1. Stroke your baby's lips gently with your finger or nipple. 2. When your baby's mouth is open wide enough, quickly bring your baby to your breast and place your entire nipple, and as much of the areolaas possible, into your baby's mouth. The areola is the colored area around your nipple. 3. Your baby's tongue should be between his or her lower gum and your breast. 4. More areola should be visible above your baby's upper lip than below the lower lip. 5. When your baby starts sucking, you will feel a gentle pull on your nipple, but you should not feel pain. Be patient. It is common for a baby to suck for about 2-3 minutes to start the flow of breast milk. 6. Make sure that your baby's mouth is correctly positioned around your nipple. Your baby's lips should create a seal on your breast and be turned outward (everted).   General instructions  Look for the following signs that your baby has successfully latched on  to your nipple: ? The baby is quietly tugging or quietly sucking without causing you pain. ? You hear the baby swallow after every 3 or 4 sucks. ? You see muscle movement above and in front of the baby's ears while he or she is sucking.  Be aware of these signs that your baby has not successfully latched on to your nipple: ? The baby makes sucking sounds or smacking sounds while nursing. ? You have nipple pain.  If your baby is not latched well, insert your little  finger between your baby's gums and your nipple to break the seal. Then try to help your baby latch again. Contact a health care provider if:  You have cracking or soreness in your nipples that lasts longer than 1 week.  You have nipple pain. Nipple cracking and soreness are common during the first week after birth, but nipple pain is never normal.  You have breast engorgement that does not improve after 48-72 hours.  You have a plugged milk duct and a fever.  You follow suggestions for a good latch but you continue to have problems or concerns.  You have pus-like discharge coming from your breast.  Your baby is not gaining weight or loses weight. Summary  Many common breastfeeding challenges are caused by poor latching.  Latching refers to how your baby's mouth attaches to your nipple during breastfeeding.  If problems continue, seek help from a lactation consultant in your community. He or she can assess you and your baby for any latching problems.  The lactation consultant can work with you to develop a plan to overcome any breastfeeding challenges. This information is not intended to replace advice given to you by your health care provider. Make sure you discuss any questions you have with your health care provider. Document Revised: 05/06/2020 Document Reviewed: 05/06/2020 Elsevier Patient Education  [employer].   Breastfeeding and Mastitis Mastitis is inflammation of the breast tissue. It can occur in women who are breastfeeding. This can make breastfeeding painful. Mastitis will sometimes go away on its own, especially if it is not caused by an infection (noninfectious mastitis). A health care provider will help determine if medical treatment is needed. Treatment may be needed if the condition is caused by a bacterial infection (infectious mastitis). What are the causes? Inflammation may be caused by a blocked milkduct, which can happen when too much milk builds up in  the breast. Causes of excess milk in the breast can include:  Poor latch. If your child is not latched on to your breast properly, he or she may not empty your breast completely while breastfeeding.  Allowing too much time to pass between feedings or scheduling feedings instead of feeding on demand.  Wearing a bra or other clothing that is too tight. This puts extra pressure on your milk ducts so milk does not flow through them as it should.  Milk remaining in the breast because it is overfilled (engorged). This is often from oversupply or missed feedings. Mastitis is often caused by a bacterial infection. Bacteria may enter the breast tissue through cuts, cracks, or openings in the skin near the nipple area. Cracks in the skin often develop when your child does not latch on properly to your breast. What increases the risk? The following factors may make you more likely to develop this condition:  History of mastitis.  Poor breastfeeding technique.  Poor nutrition and hydration.  Smoking.  Stress.  Tiredness (fatigue). What are the  signs or symptoms? Symptoms of this condition include:  Swelling, redness, tenderness, and pain in an area of your breast. These usually affect the upper part of the breast, toward the armpit region. In most cases, these symptoms affect only one breast. In some cases, the symptoms may occur in both breasts at the same time and affect a larger portion of breast tissue.  Swelling of the glands under your arm on the same side.  Fatigue, headache, and flu-like muscle aches.  Fever.  Rapid pulse. Symptoms usually last 2-5 days. Breast pain and redness are at their worst on days 2 and 3, and they usually go away by day 5. If an infection is left to worsen, a collection of pus, or an abscess, may develop.   How is this diagnosed? This condition is usually diagnosed based on:  Your symptoms.  A physical exam. In some cases, tests may be done, such  as:  Blood tests to check whether your body is fighting a bacterial infection.  Mammogram or ultrasound tests to rule out other problems or diseases.  Fluid tests. If an abscess has developed, the fluid in the abscess may be removed with a needle. The fluid may be checked to see whether bacteria are present.  Breast milk testing. A sample of your breast milk may be tested for bacteria. How is this treated? This condition will sometimes go away on its own with continued breastfeeding or pumping. Your health care provider may choose to wait 24 hours after first seeing you to decide whether treatment is needed. If treatment is needed, it may include:  Continuing to breastfeed or pump from both breasts to allow adequate milk flow and prevent an abscess from forming. Strategies to support breastfeeding include: ? Breastfeeding from the affected side first. ? Using breast massage. ? Applying warmth to the affected area before breastfeeding. This can help with milk flow. Try a warm cloth or warm shower.  Self-care, such as rest and drinking more fluids.  Pain medicine.  Antibiotic medicine to treat a bacterial infection.  Using a fine needle to remove fluid from an abscess if one has developed. Follow these instructions at home: Medicines  Take over-the-counter and prescription medicines only as told by your health care provider.  If you were prescribed an antibiotic medicine, take it as told by your health care provider. Do not stop taking the antibiotic even if you start to feel better. Managing pain and swelling If directed, put ice on the affected area of your breast right after breastfeeding or pumping. To do this:  Put ice in a plastic bag.  Place a towel between your skin and the bag.  Leave the ice on for 20 minutes, 2-3 times a day.  Take off the ice if your skin turns bright red. This is very important.   Breast care  Keep your nipples clean and dry.  If you go back to  work, pump your breasts while at work to stay on your nursing schedule.  Do not allow your breasts to become engorged.  Do not wear a tight or underwire bra. Wear a soft, supportive bra.  Check your nipples for any cracks or blisters. If you find any, talk with your health care provider or a breastfeeding specialist (lactation consultant) for treatment. Breastfeeding tips  Continue to empty your breasts as often as possible, either by breastfeeding or using an electric breast pump. This is very important for preventing mastitis or treating it if it develops.  Ask your health care provider whether you need to change your breastfeeding or pumping routine.  If directed, apply moist heat to the affected area of your breast right before breastfeeding or pumping. Use the heat source that your health care provider recommends.  During breastfeeding, empty the first breast completely before going to the other breast. If your child is not emptying your breasts completely, use a breast pump to empty your breasts.  Use breast massage during feeding or pumping sessions. General instructions  Drink enough fluid to keep your urine pale yellow. This is especially important if you have a fever.  Get plenty of rest.  Wash your hands often with soap and water for at least 20 seconds.  Wash pump parts with hot and soapy water.  Keep all follow-up visits. This is important. Where to find more information  [employer]: llli.org Contact a health care provider if:  You have pus-like discharge from your breast.  You have a fever.  Your symptoms do not improve within 2 days of starting treatment.  Your symptoms return after you have recovered from a breast infection. Get help right away if:  Your pain and swelling are getting worse.  You have pain that is not controlled with medicine.  You have a red line going from your breast toward your armpit. Summary  Mastitis is  inflammation of the breast tissue. It is often caused by a blocked milk duct or bacteria.  If needed, treatment may include continuing to breastfeed or pump, applying warmth or cold and other breastfeeding strategies, self-care, and medicines.  If you were prescribed an antibiotic medicine, take it as told by your health care provider. Do not stop taking the antibiotic even if you start to feel better.  Continue to empty your breasts as often as possible either by breastfeeding or using an electric breast pump. This information is not intended to replace advice given to you by your health care provider. Make sure you discuss any questions you have with your health care provider. Document Revised: 03/11/2020 Document Reviewed: 03/11/2020 Elsevier Patient Education  2021 [employer].

## 2021-01-15 NOTE — Progress Notes (Signed)
Breast pain and tenederness x 1 week. Pt stated trying warm compression and massaging the breast, but noticed that it helps for a day or two then the pain is back.

## 2021-01-15 NOTE — Telephone Encounter (Signed)
Pt calling from Dr. Andreas Blower office, being seen for possible mastitis, painful right nipple and breast, has questions about what could be going on, mom has been breastfeeding and frequently giving baby pumped breast milk so that son could feed baby, right nipple is red and areola is red, has shooting pains in breast when breastfeeding, has only been pumping recently and not putting baby to breast, advised mom to continue pumping x 24 hrs to allow nipple to heal, this could possibly be a yeast infection in breast, and would need to be treated, advised her to ask Dr. Marcelline Mates for [facility] [employer] nipple ointment script also, has already been precribed an antibx, examine baby's mouth, may need to ask ped for yeast med for baby, use low suction when pumping and use coconut oil in breast shield when pumping, may be a possibility that baby is having issues transiitioning  from breast to bottle, but left nipple is not sore.  If pain with latch not decreased by Monday with above measures, call [place] office for lactation consult

## 2021-01-15 NOTE — Progress Notes (Signed)
    GYNECOLOGY PROGRESS NOTE  Subjective:    Patient ID: Diana Brooks, female    DOB: 01/23/1981, 40 y.o.   MRN: [id number]  HPI  Patient is a 40 y.o. [id number] female who is 3 weeks postpartum who presents for complaints of bilateral breast tenderness, R>L for the past week. She states that the pain was ongoing for several days, she took some Tylenol and used warm compresses which help for 1-2 days, and then the pain returns.  She denies any fevers, breast streaks/redness.  She notes a lot of pain in the nipples. Lately she has only been pumping on the right side due to the pain, but will still put infant to the breast on the left side. She is unsure if it may be due to her frequency of using the pump (notes she sometimes doesn't clean her pump between every single feed).   The following portions of the patient's history were reviewed and updated as appropriate: allergies, current medications, past family history, past medical history, past social history, past surgical history and problem list.  Review of Systems Pertinent items noted in HPI and remainder of comprehensive ROS otherwise negative.   Objective:   Blood pressure 111/75, pulse 75, height 5\' 4"  (1.626 m), weight 156 lb 6.4 oz (70.9 kg), currently breastfeeding. General appearance: alert and no distress Breasts: lactating, no erythema but bilateral tenderness noted, no duct engorgement, nipples appears mildly irritated, tender.   Assessment:   Lactation problem  Plan:   - Although no redness or dryness of nipples, patient with significant pain over the past week.  Discussed cleaning of pump, alternating warm and cold compresses, nipple shields, and will prescribe Dicloxacillin for possible mastitis. Discussed infant evaluation for thrush if suspected.  - Patient connected with lactation consultant, recommended also prescribed all purpose nipple cream.  Will prescribe.  - RTC in 3 weeks for postpartum visit, or sooner if  symptoms worsen or fail to improve.     Rubie Maid, MD [facility]

## 2021-01-28 DIAGNOSIS — D485 Neoplasm of uncertain behavior of skin: Secondary | ICD-10-CM | POA: Diagnosis not present

## 2021-01-28 DIAGNOSIS — C44319 Basal cell carcinoma of skin of other parts of face: Secondary | ICD-10-CM | POA: Diagnosis not present

## 2021-01-28 DIAGNOSIS — B078 Other viral warts: Secondary | ICD-10-CM | POA: Diagnosis not present

## 2021-01-28 DIAGNOSIS — D235 Other benign neoplasm of skin of trunk: Secondary | ICD-10-CM | POA: Diagnosis not present

## 2021-01-28 DIAGNOSIS — Z08 Encounter for follow-up examination after completed treatment for malignant neoplasm: Secondary | ICD-10-CM | POA: Diagnosis not present

## 2021-01-28 DIAGNOSIS — Z85828 Personal history of other malignant neoplasm of skin: Secondary | ICD-10-CM | POA: Diagnosis not present

## 2021-01-28 DIAGNOSIS — D2372 Other benign neoplasm of skin of left lower limb, including hip: Secondary | ICD-10-CM | POA: Diagnosis not present

## 2021-02-03 ENCOUNTER — Ambulatory Visit (INDEPENDENT_AMBULATORY_CARE_PROVIDER_SITE_OTHER): Payer: 59 | Admitting: Obstetrics and Gynecology

## 2021-02-03 ENCOUNTER — Other Ambulatory Visit: Payer: Self-pay

## 2021-02-03 ENCOUNTER — Encounter: Payer: Self-pay | Admitting: Obstetrics and Gynecology

## 2021-02-03 DIAGNOSIS — M6208 Separation of muscle (nontraumatic), other site: Secondary | ICD-10-CM

## 2021-02-03 DIAGNOSIS — R739 Hyperglycemia, unspecified: Secondary | ICD-10-CM

## 2021-02-03 NOTE — Progress Notes (Signed)
Pt present for 5 week postpartum visit. Pt stated that she was doing well. Pt is unsure of which type of birth control and would like to discuss options. EPDS=9.

## 2021-02-03 NOTE — Progress Notes (Signed)
   OBSTETRICS POSTPARTUM CLINIC PROGRESS NOTE  Subjective:     Diana Brooks is a 40 y.o. G46P2002 female who presents for a postpartum visit. She is 6 weeks postpartum following a repeat low cervical transverse Cesarean section. I have fully reviewed the prenatal and intrapartum course. The delivery was at 60 gestational weeks.  Anesthesia: spinal. Postpartum course has been complicated by breast issues (mastitis) Baby's course has been well. Baby is feeding by breast. Bleeding: patient not resumed menses, with No LMP recorded.. Bowel function is normal. Bladder function is normal. Patient is not sexually active. Contraception method desired is tubal ligation. Postpartum depression screening: negative (EDPS = 9).  Does feel like she has some residual postpartum blues. However is improving with leaving the house more.   She notes concerns that she checked her fasting blood sugar this morning  and was 101. Ate barbecue last night around 7:30 pm. Is concerned about staying regimented on her diet now that the baby is born.   Also notes diastasis recti has worsened since the birth of her child. Has bought a girdle but has not noticed much of an effect yet.    The following portions of the patient's history were reviewed and updated as appropriate: allergies, current medications, past family history, past medical history, past social history, past surgical history and problem list.  Review of Systems Pertinent items noted in HPI and remainder of comprehensive ROS otherwise negative.   Objective:    BP (!) 122/95   Pulse 83   Ht 5\' 4"  (1.626 m)   Wt 155 lb 9.6 oz (70.6 kg)   Breastfeeding Yes   BMI 26.71 kg/m   General:  alert and no distress   Breasts:  inspection negative, no nipple discharge or bleeding, no masses or nodularity palpable  Lungs: clear to auscultation bilaterally  Heart:  regular rate and rhythm, S1, S2 normal, no murmur, click, rub or gallop  Abdomen: soft, non-tender;  bowel sounds normal; no masses,  no organomegaly.  Well healed Pfannenstiel incision.  Moderate diastasis present.    Pelvis:  deferred  Rectal Exam: Not performed.       Labs:  Lab Results  Component Value Date   HGB 10.8 (L) 12/25/2020     Assessment:   1. Routine postpartum exam from Cesarean section.  2. Elevated fasting blood sugar 3. Diastasis recti  Plan:   1. Contraception: tubal ligation 2. Elevated fasting blood sugar. Patient with borderline blood sugars during pregnancy, managed like a diet-controlled DM during pregnancy.  Will recheck blood sugars  3. Diastasis recti - patient notes she this has worsened with each pregnancy.  Discussed use of girdle, ab exercises. Also can refer to physical therapy if these measures don't work. Lastly, can send to General Surgery.  4. Follow up in: 3 months or as needed.   Rubie Maid, MD [facility]

## 2021-02-03 NOTE — Patient Instructions (Signed)
Eating Plan for Breastfeeding Women On the days when a woman is breastfeeding, her body burns more calories than usual (about 400-500 extra calories). It is important that you eat enough calories by consuming a variety of healthy foods that includes healthy fats, lean proteins, whole grains, and fruits and vegetables. You do not need to follow a special diet while breastfeeding your baby. Focus on making healthy choices to help support and maintain your milk production. What are tips for following this plan? Meal planning  To take in the extra calories, eat nutrient rich foods and snacks. These are also called nutrient dense foods. They include granola bars, vegetables and fruit with dip, sandwiches, cheese and crackers, yogurt, trail mix, hard boiled eggs, or energy bars.  Plan to have a supply of snacks that you can prepare and eat easily. Consider keeping a basket of non-perishable snacks near you for when you are breastfeeding. This will ensure that you have something healthy to eat when you are hungry.  Try to have at least 3 meals and 2-3 snacks a day, or 5-7 smaller meals throughout the day. Lifestyle  Breastfeeding is demanding on the body, especially in the early weeks and months after you have your baby. This is not the time to diet or worry about losing the weight you may have gained during pregnancy. This is the time to nourish your body and your baby by listening to your hunger and eating and drinking when your body needs it.  By eating a variety of foods, you should be able to meet the additional caloric needs. If you are losing weight too fast or having a hard time getting enough to eat, contact your health care provider or a dietitian. Medicines, vitamins, and supplements  Talk with your health care provider about all medicines that you are taking. Medicines can pass to your baby through your breast milk. Certain medicines may slow, delay, or lessen your milk production and may be  harmful to your baby.  Continue to take your prenatal vitamins and any other supplements as told by your health care provider.  If you follow a special diet, such as a vegan diet, or you have certain health conditions, you may need additional supplements. Talk with your health care provider about this. Amount and variety  Consume enough calories each day to support the increased need to maintain your milk supply. Having snacks and meals that include healthy fats will help you get the extra calories.  Drink plenty of water to stay nourished and to help maintain your milk supply. Aim for 64 oz (2 L) or more a day, or as much as told by your health care provider. Your urine should be pale yellow in color. If you notice that your urine is dark yellow, drink more water.  Continue to follow a healthy, well-balanced diet that includes healthy snacks. The taste of your milk will be affected by what you eat. Eating different foods will expose your baby to different tastes, which may help your baby to accept solid foods more easily later on. What to limit or avoid  Limit your overall intake of foods that have "empty calories." These are foods that have little nutritional value, such as sweets, desserts, candies, and sugar-sweetened beverages.  Avoid drinking more than 16-24 oz (473-710 mL) of caffeinated drinks in a day. Caffeine is dehydrating. It will also enter your breast milk, and this might bother your baby or interfere with his or her sleep.  Avoid all  alcohol while breastfeeding, if you can. If you drink, limit how much you have to one drink a day. Know how much alcohol is in your drink. In the [place] one drink equals one 12 oz bottle of beer (355 mL), one 5 oz glass of wine (148 mL), or one 1 oz glass of hard liquor (44 mL). It is better for your baby if: ? You wait to have your first alcoholic drink until your breastfeeding has been well established, which is usually after 2-3 months. ? You wait  4 hours or longer to breastfeed after you have an alcoholic drink. As an alternative, you may pump your breast milk before you drink alcohol. You can store that milk to feed to your baby at a later time.  You may notice that certain foods cause you or your baby to have increased gas and may cause fussiness in your baby. If you notice increased gas or fussiness in your baby when you eat these foods, consider avoiding them while breastfeeding. Food safety  To prevent food-borne illnesses, practice good food safety and cleanliness, such as washing your hands before you eat and after you prepare raw meat. What foods should I eat? Fruits All fruits. Try to eat a variety of colors and types of fruits to get a full range of vitamins and minerals. Remember to wash your fruits well before eating. Vegetables All vegetables. Try to eat a variety of colors and types of vegetables to get a full range of vitamins and minerals. Remember to wash your vegetables well before eating. Grains All grains. Try to choose whole grains, such as whole-wheat bread, oatmeal, or brown rice. Meats and other protein foods Lean meats. Try to eat chicken, [place], fish, and lean cuts of beef, veal, or pork. If you eat fish or seafood, choose options that are low in mercury, such as salmon, canned light tuna, catfish, shrimp, crab, and lobster. Other good protein sources include tofu, tempeh, beans, eggs, peanut butter, and other nut butters. Dairy Dairy is okay to eat. Continue to drink milk and milk alternatives (such as almond milk), or eat yogurt, cheese, cottage cheese, or sour cream. Beverages Most beverages are okay. Fats and oils Fats are important in helping you get the extra calories you need each day. Focus on healthy fats like olive oil, avocado oil, and fats found in plants like nuts, seeds, and avocado. Sweets and desserts All sweets and desserts are okay. Seasoning and other foods All seasonings are okay. The  items listed above may not be a complete list of foods and beverages you can eat. Contact a dietitian for more information.   What foods should I avoid? Meats and other protein foods Avoid eating fish that have high mercury content, such as tilefish, shark, swordfish, and king mackerel. To learn more about mercury in fish, talk with your health care provider or look for online resources, such as:  [place] Beverages Avoid drinking sugar-sweetened beverages such as sodas, teas, or energy drinks. Limit the amount of caffeine and alcohol you drink. The items listed above may not be a complete list of foods and beverages you should avoid. Contact a dietitian for more information. Where to find more information You can talk with your health care provider or use an online calorie calculator to determine how many calories you need to eat each day while breastfeeding, such as:  [employer].no Summary  Focus on making healthy choices to help support and maintain your milk production. To maintain a  good milk supply and stay nourished yourself, it is also important that you drink plenty of water.  Talk with your health care provider about all medicines that you are taking. Continue to take your prenatal vitamins and any other supplements as told by your health care provider.  Avoid drinking large amounts of caffeine or alcohol.  Continue to follow a healthy, well-balanced diet that includes healthy snacks. The taste of your milk will be affected by what you eat. Eating different foods will expose your baby to different tastes, which may help your baby to accept solid foods more easily later on. This information is not intended to replace advice given to you by your health care provider. Make sure you discuss any questions you have with your health care provider. Document Revised: 04/28/2020 Document Reviewed: 04/28/2020 Elsevier Patient Education  2021 [place]  Diastasis recti is a condition in which the muscles of the abdomen (rectus abdominis muscles) become thin and separate. The result is a wider space between the muscles of the right and left abdomen (abdominal muscles). This wider space between the muscles may cause a bulge in the middle of the abdomen. This bulge may be noticed when a person is straining or when he or she sits up after lying down. Diastasis recti can affect men and women. It is most common among pregnant women, babies, people with obesity, and people who have had abdominal surgery. Exercise or surgery may help correct this condition. What are the causes? Common causes of this condition include:  Pregnancy. As the uterus grows in size, it puts pressure on the abdominal muscles, causing the muscles to separate.  Obesity. Excess fat puts pressure on abdominal muscles.  Weight lifting.  Some exercises of the abdomen.  Advanced age.  Genetics.  Having had surgery on the abdomen before. What increases the risk? This condition is more likely to develop in:  Women.  Newborns, especially newborns who are born early (prematurely). What are the signs or symptoms? Common symptoms of this condition include:  A bulge in the middle of your abdomen. You will notice it most when you sit up or strain.  Pain in your low back, hips, or the area between your hip bones (pelvis).  Constipation.  Being unable to control when you urinate (urinary incontinence).  Bloating.  Poor posture. How is this diagnosed? This condition is diagnosed with a physical exam. During the exam, your health care provider will ask you to lie flat on your back and do a crunch or half sit-up. If you have diastasis recti, a bulge will appear lengthwise between your abdominal muscles in the center of your abdomen. Your health care provider will measure the gap between your muscles with one of the following:  A medical device used to measure the space  between two objects (caliper).  A tape measure.  CT scan.  Ultrasound.  Finger spaces. Your health care provider will measure the space using his or her fingers. How is this treated? If your muscle separation is not too large, you may not need treatment. However, if you are a woman who plans to become pregnant again, you should treat this condition before your next pregnancy. Treatment may include:  Physical therapy exercises to strengthen and tighten your abdominal muscles.  Lifestyle changes such as weight loss and exercise.  Over-the-counter pain medicines as needed.  Surgery to correct the separation. Follow these instructions at home: Activity  Return to your  normal activities as told by your health care provider. Ask your health care provider what activities are safe for you.  Do exercises as told by your health care provider. Make sure you are doing your exercises and movements correctly when lifting weights or doing exercises using your abdominal muscles or the muscles in the center of your body that give stability (core muscles). Proper form can help to prevent this condition from happening again. General instructions  If you are overweight, ask your health care provider for help with weight loss. Losing even a small amount of weight can help to improve your diastasis recti.  Take over-the-counter or prescription medicines only as told by your health care provider.  Do not strain. Straining can make the separation worse. Examples of straining include: ? Pushing hard to have a bowel movement, such as when you have constipation. ? Lifting heavy objects or lifting children. ? Standing up and sitting down.  You may need to take these actions to prevent or treat constipation: ? Drink enough fluid to keep your urine pale yellow. ? Take over-the-counter or prescription medicines. ? Eat foods that are high in fiber, such as beans, whole grains, and fresh fruits and  vegetables. ? Limit foods that are high in fat and processed sugars, such as fried or sweet foods.  Keep all follow-up visits. This is important. Contact a health care provider if:  You notice a new bulge in your abdomen. Get help right away if:  You experience severe discomfort in your abdomen.  You develop severe abdominal pain along with nausea, vomiting, or a fever. Summary  Diastasis recti is a condition in which the muscles of the abdomen (rectus abdominismuscles) become thin and separate. You may notice a bulge in your abdomen because the space has widened between the muscles of the right and left abdomen.  The most common symptom is a bulge in the middle of your abdomen. You will notice it most when you sit up or strain.  This condition is diagnosed with a physical exam.  If the muscle separation is not too big, you may not need treatment. Otherwise, you may need to do physical therapy or have surgery. This information is not intended to replace advice given to you by your health care provider. Make sure you discuss any questions you have with your health care provider. Document Revised: 07/10/2020 Document Reviewed: 07/10/2020 Elsevier Patient Education  [place].

## 2021-02-15 DIAGNOSIS — M9901 Segmental and somatic dysfunction of cervical region: Secondary | ICD-10-CM | POA: Diagnosis not present

## 2021-02-15 DIAGNOSIS — M9902 Segmental and somatic dysfunction of thoracic region: Secondary | ICD-10-CM | POA: Diagnosis not present

## 2021-02-15 DIAGNOSIS — M461 Sacroiliitis, not elsewhere classified: Secondary | ICD-10-CM | POA: Diagnosis not present

## 2021-02-15 DIAGNOSIS — M9903 Segmental and somatic dysfunction of lumbar region: Secondary | ICD-10-CM | POA: Diagnosis not present

## 2021-02-15 DIAGNOSIS — M9904 Segmental and somatic dysfunction of sacral region: Secondary | ICD-10-CM | POA: Diagnosis not present

## 2021-02-15 DIAGNOSIS — M4603 Spinal enthesopathy, cervicothoracic region: Secondary | ICD-10-CM | POA: Diagnosis not present

## 2021-03-10 DIAGNOSIS — C44319 Basal cell carcinoma of skin of other parts of face: Secondary | ICD-10-CM | POA: Diagnosis not present

## 2021-03-10 HISTORY — PX: OTHER SURGICAL HISTORY: SHX169

## 2021-03-18 ENCOUNTER — Encounter: Payer: 59 | Admitting: Obstetrics and Gynecology

## 2021-03-23 DIAGNOSIS — M9902 Segmental and somatic dysfunction of thoracic region: Secondary | ICD-10-CM | POA: Diagnosis not present

## 2021-03-23 DIAGNOSIS — M9903 Segmental and somatic dysfunction of lumbar region: Secondary | ICD-10-CM | POA: Diagnosis not present

## 2021-03-23 DIAGNOSIS — M461 Sacroiliitis, not elsewhere classified: Secondary | ICD-10-CM | POA: Diagnosis not present

## 2021-03-23 DIAGNOSIS — M9904 Segmental and somatic dysfunction of sacral region: Secondary | ICD-10-CM | POA: Diagnosis not present

## 2021-03-23 DIAGNOSIS — M4603 Spinal enthesopathy, cervicothoracic region: Secondary | ICD-10-CM | POA: Diagnosis not present

## 2021-03-23 DIAGNOSIS — M9901 Segmental and somatic dysfunction of cervical region: Secondary | ICD-10-CM | POA: Diagnosis not present

## 2021-04-20 ENCOUNTER — Encounter: Payer: 59 | Admitting: Obstetrics and Gynecology

## 2021-04-21 ENCOUNTER — Encounter: Payer: 59 | Admitting: Obstetrics and Gynecology

## 2021-04-22 ENCOUNTER — Telehealth: Payer: Self-pay | Admitting: Adult Health

## 2021-04-22 DIAGNOSIS — J22 Unspecified acute lower respiratory infection: Secondary | ICD-10-CM

## 2021-04-22 MED ORDER — AZITHROMYCIN 250 MG PO TABS: ORAL_TABLET | ORAL | 0 refills | Status: AC

## 2021-04-22 NOTE — Progress Notes (Signed)
[facility] [address] [place], [place] [address]   Office Visit Note  Patient Name: Diana Brooks  [id number]  [id number]  Date of Service: 04/22/2021  No chief complaint on file.    HPI Pt is here for a sick visit. Seen outside in car.  Patient reports 6 days ago she began to feel poorly. She had fever, started vomiting, and sore throat.  This resolved within a few days, and then she noiced cough, nasal congestin, and PND developed.  She has been taking Tylenol, because she is breast feeding and isn't sure what else she can take.  She reports her husband, son, and infant all had similar symptoms.  No one has tested positive for covid, flu, strep or RSV.  They have since improved and her chest symptoms are lingering.  She has a history of exercise induced asthma.        Current Medication:  Outpatient Encounter Medications as of 04/22/2021  Medication Sig  . azithromycin (ZITHROMAX) 250 MG tablet Take 2 tablets on day 1, then 1 tablet daily on days 2 through 5  . acetaminophen (TYLENOL) 500 MG tablet Take 500-1,000 mg by mouth every 6 (six) hours as needed (for pain.).  [place] Kitchen albuterol (VENTOLIN HFA) 108 (90 Base) MCG/ACT inhaler Inhale 1-2 puffs into the lungs every 6 (six) hours as needed for wheezing or shortness of breath.  . Cholecalciferol (VITAMIN D-3) 125 MCG (5000 UT) TABS Take 5,000 Units by mouth 3 (three) times a week.  . Prenatal Vit-Fe Fumarate-FA (MULTIVITAMIN-PRENATAL) 27-0.8 MG TABS tablet Take 1 tablet by mouth every evening.   No facility-administered encounter medications on file as of 04/22/2021.      Medical History: Past Medical History:  Diagnosis Date  . Anemia   . Anxiety   . Asthma    exercise induced  . Complication of anesthesia   . Dysmenorrhea   . Endometriosis   . Family history of adverse reaction to anesthesia    N/V from mother  . GDM (gestational diabetes mellitus)   . Hyperprolactinemia ([place])   . IBS (irritable bowel  syndrome)   . Infertility, female   . PONV (postoperative nausea and vomiting)    states phenergan helps alot post op n/v     Vital Signs: There were no vitals taken for this visit.   Review of Systems  Constitutional: Positive for fatigue.  Respiratory: Positive for cough, chest tightness and wheezing.     Physical Exam Constitutional:      Appearance: She is normal weight.  HENT:     Head: Normocephalic.     Right Ear: Tympanic membrane, ear canal and external ear normal.     Left Ear: Tympanic membrane, ear canal and external ear normal.     Nose: Congestion present.     Mouth/Throat:     Mouth: Mucous membranes are moist.     Pharynx: Oropharynx is clear.  Eyes:     Pupils: Pupils are equal, round, and reactive to light.  Pulmonary:     Effort: Pulmonary effort is normal.     Comments: Diminished breath sounds in [facility] Neurological:     Mental Status: She is alert.    Assessment/Plan: 1. Lower respiratory infection Use azithromycin as discussed.  Also consider using albuterol inhaler, verify with pediatrician while breast feeding. Take complete course of antibiotics as prescribed.  Take with food.  If symptoms fail to improve, or new/worse symptoms develop follow up in clinic.  -  azithromycin (ZITHROMAX) 250 MG tablet; Take 2 tablets on day 1, then 1 tablet daily on days 2 through 5  Dispense: 6 tablet; Refill: 0      General Counseling: louella medaglia understanding of the findings of todays visit and agrees with plan of treatment. I have discussed any further diagnostic evaluation that may be needed or ordered today. We also reviewed her medications today. she has been encouraged to call the office with any questions or concerns that should arise related to todays visit.   No orders of the defined types were placed in this encounter.   Meds ordered this encounter  Medications  . azithromycin (ZITHROMAX) 250 MG tablet    Sig: Take 2 tablets on day 1, then 1  tablet daily on days 2 through 5    Dispense:  6 tablet    Refill:  0    Time spent: [place] AGNP-C Nurse Practitioner

## 2021-04-27 DIAGNOSIS — M9901 Segmental and somatic dysfunction of cervical region: Secondary | ICD-10-CM | POA: Diagnosis not present

## 2021-04-27 DIAGNOSIS — M9904 Segmental and somatic dysfunction of sacral region: Secondary | ICD-10-CM | POA: Diagnosis not present

## 2021-04-27 DIAGNOSIS — M4603 Spinal enthesopathy, cervicothoracic region: Secondary | ICD-10-CM | POA: Diagnosis not present

## 2021-04-27 DIAGNOSIS — M9903 Segmental and somatic dysfunction of lumbar region: Secondary | ICD-10-CM | POA: Diagnosis not present

## 2021-04-27 DIAGNOSIS — M461 Sacroiliitis, not elsewhere classified: Secondary | ICD-10-CM | POA: Diagnosis not present

## 2021-04-27 DIAGNOSIS — M9902 Segmental and somatic dysfunction of thoracic region: Secondary | ICD-10-CM | POA: Diagnosis not present

## 2021-04-28 ENCOUNTER — Ambulatory Visit: Payer: Self-pay | Admitting: Physician Assistant

## 2021-04-28 ENCOUNTER — Encounter: Payer: Self-pay | Admitting: Physician Assistant

## 2021-04-28 VITALS — BP 116/81 | HR 75 | Temp 97.7°F | Resp 16 | Ht 64.0 in | Wt 145.0 lb

## 2021-04-28 DIAGNOSIS — J069 Acute upper respiratory infection, unspecified: Secondary | ICD-10-CM

## 2021-04-28 NOTE — Progress Notes (Signed)
Pt states that the cough and congestion got better 3 days ago with the Z pack and she feels like the cough and congestion is coming back.

## 2021-04-28 NOTE — Progress Notes (Signed)
   Subjective: Cough    Patient ID: Diana Brooks, female    DOB: [birth year], 40 y.o.   MRN: [id number]  HPI Patient presents for reevaluation of cough and congestion.  Patient was seen on 04/22/2021 and was diagnosed with lower respiratory infection.  Patient was given a Z-Pak.  Patient that she felt better after the second day of taking Z-Pak.  Patient finished antibiotic and noticed that her symptoms were returning.  Patient's was not given any other medication secondary to breast-feeding.  Patient also has not taken antibiotics because she normally developed oral thrush with antibiotics.  Patient denies viral illnesses going through her family.  She is nursing a 76-month-old who is in a daycare center.  She also has children in school.  And her husband has become symptomatic with nonproductive cough.    Review of Systems    Negative septal complaint Objective:   Physical Exam No acute distress.  Temperature 97.7, pulse 75, respiration 16, BP is 116/81, patient 97% O2 sat on room air.   HEENT is unremarkable.  Neck is supple without adenopathy or bruits.  Lungs clear to auscultation.  Heart regular rate and rhythm.       Assessment & Plan: Viral respiratory infection  Discussed with patient rationale for not taking further antibiotics.  Advised conservative treatment with over-the-counter cough medicine secondary to her breast-feeding.  Advised to follow-up in 2 to 3 days if no improvement.

## 2021-05-07 ENCOUNTER — Ambulatory Visit (INDEPENDENT_AMBULATORY_CARE_PROVIDER_SITE_OTHER): Payer: 59 | Admitting: Obstetrics and Gynecology

## 2021-05-07 ENCOUNTER — Other Ambulatory Visit (HOSPITAL_COMMUNITY)
Admission: RE | Admit: 2021-05-07 | Discharge: 2021-05-07 | Disposition: A | Discharge status: Home / Self Care | Payer: 59 | Source: Ambulatory Visit | Attending: Obstetrics and Gynecology | Admitting: Obstetrics and Gynecology

## 2021-05-07 ENCOUNTER — Other Ambulatory Visit: Payer: Self-pay

## 2021-05-07 ENCOUNTER — Encounter: Payer: 59 | Admitting: Obstetrics and Gynecology

## 2021-05-07 ENCOUNTER — Encounter: Payer: Self-pay | Admitting: Obstetrics and Gynecology

## 2021-05-07 VITALS — BP 112/75 | HR 71 | Ht 64.0 in | Wt 148.3 lb

## 2021-05-07 DIAGNOSIS — E785 Hyperlipidemia, unspecified: Secondary | ICD-10-CM | POA: Diagnosis not present

## 2021-05-07 DIAGNOSIS — Z01419 Encounter for gynecological examination (general) (routine) without abnormal findings: Secondary | ICD-10-CM

## 2021-05-07 DIAGNOSIS — Z124 Encounter for screening for malignant neoplasm of cervix: Secondary | ICD-10-CM | POA: Insufficient documentation

## 2021-05-07 DIAGNOSIS — R6882 Decreased libido: Secondary | ICD-10-CM

## 2021-05-07 DIAGNOSIS — R4586 Emotional lability: Secondary | ICD-10-CM | POA: Diagnosis not present

## 2021-05-07 DIAGNOSIS — F439 Reaction to severe stress, unspecified: Secondary | ICD-10-CM

## 2021-05-07 DIAGNOSIS — R69 Illness, unspecified: Secondary | ICD-10-CM | POA: Diagnosis not present

## 2021-05-07 NOTE — Progress Notes (Signed)
Pt present for annual exam. Pt stated that she has no desire to have sex and feels like crap all the time. PHQ-9=3; GAD-7=5.

## 2021-05-07 NOTE — Progress Notes (Signed)
GYNECOLOGY ANNUAL PHYSICAL EXAM PROGRESS NOTE  Subjective:    Diana Brooks is a 40 y.o. G108P2002 female who presents for an annual exam. The patient has no complaints today. The patient is sexually active.  The patient participates in regular exercise: no. Has the patient ever been transfused or tattooed?: no. The patient reports that there is not domestic violence in her life.    Diana Brooks reports the following concerns today:  She complains of persistent decreased libido since the birth of her child approximately 4-1/2 months ago.  Patient is still breast-feeding. Reports feeling overall mildly depressed mood over the past several weeks.  She does report stressors ongoing at work and at home as her child has been sick and her job is [employer]ician, dispensing.   Gynecologic History No LMP recorded.  Patient is lactating. Menarche age: 55 Contraception: none. Cites h/o infertility History of STI's: Denies Last Pap: 12/2017. Results were: normal.  Denies h/o abnormal pap smears.     Upstream - 05/07/21 1126       Pregnancy Intention Screening   Does the patient want to become pregnant in the next year? No    Does the patient's partner want to become pregnant in the next year? No    Would the patient like to discuss contraceptive options today? No      Contraception Wrap Up   Current Method No Method - Other Reason            The pregnancy intention screening data noted above was reviewed. Potential methods of contraception were not discussed. The patient elected to proceed with No Method - Other Reason.   OB History  Gravida Para Term Preterm AB Living  2 2 2  0 0 2  SAB IAB Ectopic Multiple Live Births  0 0 0 0 2    # Outcome Date GA Lbr Len/2nd Weight Sex Delivery Anes PTL Lv  2 Term 12/24/20 [redacted]w[redacted]d  9 lb 7.7 oz (4.3 kg) F CS-LTranv Spinal  LIV     Name: Diana Brooks     Apgar1: 8  Apgar5: 9  1 Term 2013   8 lb 1.6 oz (3.674 kg) M CS-LTranv   LIV    Past Medical  History:  Diagnosis Date   Anemia    Anxiety    Asthma    exercise induced   Complication of anesthesia    Dysmenorrhea    Endometriosis    Family history of adverse reaction to anesthesia    N/V from mother   GDM (gestational diabetes mellitus)    Hyperprolactinemia ([place])    IBS (irritable bowel syndrome)    Infertility, female    PONV (postoperative nausea and vomiting)    states phenergan helps alot post op n/v    Past Surgical History:  Procedure Laterality Date   CESAREAN SECTION  2013   CESAREAN SECTION N/A 12/24/2020   Procedure: CESAREAN SECTION;  Surgeon: Rubie Maid, MD;  Location: [facility];  Service: Obstetrics;  Laterality: N/A;  REPEAT CESAREAN SECTION   CHROMOPERTUBATION N/A 01/11/2016   Procedure: CHROMOPERTUBATION OF FALLOPIAN TUBES;  Surgeon: Brayton Mars, MD;  Location: [facility];  Service: Gynecology;  Laterality: N/A;   CHROMOPERTUBATION  2012   GANGLION CYST EXCISION Left    wrist   LAPAROSCOPY     lap with excision of fulguration of endometriosis   LAPAROSCOPY N/A 01/11/2016   Procedure: LAPAROSCOPY DIAGNOSTIC/ WITH EXCISION AND FULGERATION OF ENDOMETRIOSIS; MYOMECTOMY;  Surgeon: Brayton Mars, MD;  Location: [facility];  Service: Gynecology;  Laterality: N/A;   skin cancer removal  03/10/2021   forehead    Family History  Problem Relation Age of Onset   Endometriosis Mother    Rheum arthritis Mother    Diabetes Paternal Grandfather    Prostate cancer Maternal Uncle    Healthy Father    Heart disease Neg Hx    Breast cancer Neg Hx    Ovarian cancer Neg Hx    Colon cancer Neg Hx     Social History   Socioeconomic History   Marital status: Married    Spouse name: Ryan   Number of children: 2   Years of education: Not on file   Highest education level: Not on file  Occupational History   Not on file  Tobacco Use   Smoking status: Never   Smokeless tobacco: Never  Vaping Use   Vaping Use: Never used  Substance and Sexual  Activity   Alcohol use: Not Currently    Comment: rare   Drug use: No   Sexual activity: Not Currently    Birth control/protection: Other-see comments    Comment: undecided  Other Topics Concern   Not on file  Social History Narrative   Not on file   Social Determinants of Health   Financial Resource Strain: Not on file  Food Insecurity: Not on file  Transportation Needs: Not on file  Physical Activity: Not on file  Stress: Not on file  Social Connections: Not on file  Intimate Partner Violence: Not on file    Current Outpatient Medications on File Prior to Visit  Medication Sig Dispense Refill   acetaminophen (TYLENOL) 500 MG tablet Take 500-1,000 mg by mouth every 6 (six) hours as needed (for pain.).     albuterol (VENTOLIN HFA) 108 (90 Base) MCG/ACT inhaler Inhale 1-2 puffs into the lungs every 6 (six) hours as needed for wheezing or shortness of breath. 6.7 g 1   Cholecalciferol (VITAMIN D-3) 125 MCG (5000 UT) TABS Take 5,000 Units by mouth 3 (three) times a week.     ONETOUCH VERIO test strip SMARTSIG:Via Meter 3-4 Times Daily     Prenatal Vit-Fe Fumarate-FA (MULTIVITAMIN-PRENATAL) 27-0.8 MG TABS tablet Take 1 tablet by mouth every evening.     No current facility-administered medications on file prior to visit.    Allergies  Allergen Reactions   Codeine Nausea And Vomiting   Levaquin [Levofloxacin In D5w] Other (See Comments)    Insomnia, fever, chills   Other Other (See Comments)    General Anesthesia-severe nausea/vomiting   Percocet [Oxycodone-Acetaminophen] Nausea And Vomiting   Vicodin [Hydrocodone-Acetaminophen] Nausea And Vomiting   Sulfa Antibiotics Rash and Other (See Comments)    Full body rash    Suprax [Cefixime] Rash      Review of Systems Constitutional: negative for chills, fatigue, fevers and sweats Eyes: negative for irritation, redness and visual disturbance Ears, nose, mouth, throat, and face: negative for hearing loss, nasal congestion,  snoring and tinnitus Respiratory: negative for asthma, cough, sputum Cardiovascular: negative for chest pain, dyspnea, exertional chest pressure/discomfort, irregular heart beat, palpitations and syncope Gastrointestinal: negative for abdominal pain, change in bowel habits, nausea and vomiting Genitourinary: negative for abnormal menstrual periods, genital lesions, and vaginal discharge, dysuria and urinary incontinence. Positive for low libido Integument/breast: negative for breast lump, breast tenderness and nipple discharge Hematologic/lymphatic: negative for bleeding and easy bruising Musculoskeletal:negative for back pain and muscle weakness Neurological: negative for dizziness, headaches, vertigo and weakness  Endocrine: negative for diabetic symptoms including polydipsia, polyuria and skin dryness Allergic/Immunologic: negative for hay fever and urticaria    Psychological: positive for - mildly depressed mood/"feeling down" negative for - anxiety, hostility, irritability, or suicidal ideation     Objective:  Blood pressure 112/75, pulse 71, height 5\' 4"  (1.626 m), weight 148 lb 4.8 oz (67.3 kg), currently breastfeeding. Body mass index is 25.46 kg/m.     General Appearance:    Alert, cooperative, no distress, appears stated age  Head:    Normocephalic, without obvious abnormality, atraumatic  Eyes:    PERRL, conjunctiva/corneas clear, EOM's intact, both eyes  Ears:    Normal external ear canals, both ears  Nose:   Nares normal, septum midline, mucosa normal, no drainage or sinus tenderness  Throat:   Lips, mucosa, and tongue normal; teeth and gums normal  Neck:   Supple, symmetrical, trachea midline, no adenopathy; thyroid: no enlargement/tenderness/nodules; no carotid bruit or JVD  Back:     Symmetric, no curvature, ROM normal, no CVA tenderness  Lungs:     Clear to auscultation bilaterally, respirations unlabored  Chest Wall:    No tenderness or deformity   Heart:    Regular  rate and rhythm, S1 and S2 normal, no murmur, rub or gallop  Breast Exam:    No tenderness, masses, or nipple abnormality  Abdomen:     Soft, non-tender, bowel sounds active all four quadrants, no masses, no organomegaly.    Genitalia:    Pelvic:external genitalia normal, vagina without lesions, discharge, or tenderness, rectovaginal septum  normal. Cervix normal in appearance, no cervical motion tenderness, no adnexal masses or tenderness.  Uterus normal size, shape, mobile, regular contours, nontender.  Rectal:    Normal external sphincter.  No hemorrhoids appreciated. Internal exam not done.   Extremities:   Extremities normal, atraumatic, no cyanosis or edema  Pulses:   2+ and symmetric all extremities  Skin:   Skin color, texture, turgor normal, no rashes or lesions  Lymph nodes:   Cervical, supraclavicular, and axillary nodes normal  Neurologic:   CNII-XII intact, normal strength, sensation and reflexes throughout       Depression screen PHQ 2/9 05/07/2021  Decreased Interest 0  Down, Depressed, Hopeless 1  PHQ - 2 Score 1  Altered sleeping 0  Tired, decreased energy 1  Change in appetite 0  Feeling bad or failure about yourself  1  Trouble concentrating 0  Moving slowly or fidgety/restless 0  Suicidal thoughts 0  PHQ-9 Score 3    GAD 7 : Generalized Anxiety Score 05/07/2021  Nervous, Anxious, on Edge 1  Control/stop worrying 1  Worry too much - different things 1  Trouble relaxing 1  Restless 0  Easily annoyed or irritable 1  Afraid - awful might happen 0  Total GAD 7 Score 5     Labs:  Lab Results  Component Value Date   WBC 14.7 (H) 12/25/2020   HGB 10.8 (L) 12/25/2020   HCT 31.4 (L) 12/25/2020   MCV 93.7 12/25/2020   PLT 122 (L) 12/25/2020    Lab Results  Component Value Date   CREATININE 0.85 01/17/2020   BUN 9 01/17/2020   NA 140 01/17/2020   K 4.3 01/17/2020   CL 104 01/17/2020    Lab Results  Component Value Date   ALT 22 01/17/2020   AST 24  01/17/2020   GGT 25 01/17/2020   ALKPHOS 80 01/17/2020   BILITOT 0.4 01/17/2020    Lab  Results  Component Value Date   TSH 2.060 01/17/2020     Assessment:   1. Encounter for well woman exam with routine gynecological exam   2. Low libido   3. Mood change   4. Stress   5. Lactating mother   55. Pap smear for cervical cancer screening   7. Hyperlipidemia, unspecified hyperlipidemia type      Plan:    - Blood tests: CBC with diff, Comprehensive metabolic panel, Lipoproteins, TSH, and HgbA1c. - Contraception: None. Patient with h/o infertility. - Discussed healthy lifestyle modifications. - Pap smear performed today.  - COVID vaccination status: up to date. Is due for booster.  - Discussed low libido and mood changes, could be hormonal, patient also is still lactating which can suppress certain chromones. Also will check TSH.  Additional cause could be increase in stress overall. Discussed methods of managing stress (exercise, yoga, mindfulness). Also discussed herbal remedies such as SAM-E, Ashwaganda, and others. To f/u if symptoms persist.  - Follow up in 1 year for annual exam, or sooner for any concerns.     Rubie Maid, MD [facility]

## 2021-05-07 NOTE — Patient Instructions (Signed)
Breast Self-Awareness Breast self-awareness is knowing how your breasts look and feel. Doing breast self-awareness is important. It allows you to catch a breast problem early while it is still small and can be treated. All women should do breast self-awareness, including women who have had breast implants. Tell your doctorif you notice a change in your breasts. What you need: A mirror. A well-lit room. How to do a breast self-exam A breast self-exam is one way to learn what is normal for your breasts and tocheck for changes. To do a breast self-exam: Look for changes  Take off all the clothes above your waist. Stand in front of a mirror in a room with good lighting. Put your hands on your hips. Push your hands down. Look at your breasts and nipples in the mirror to see if one breast or nipple looks different from the other. Check to see if: The shape of one breast is different. The size of one breast is different. There are wrinkles, dips, and bumps in one breast and not the other. Look at each breast for changes in the skin, such as: Redness. Scaly areas. Look for changes in your nipples, such as: Liquid around the nipples. Bleeding. Dimpling. Redness. A change in where the nipples are.  Feel for changes  Lie on your back on the floor. Feel each breast. To do this, follow these steps: Pick a breast to feel. Put the arm closest to that breast above your head. Use your other arm to feel the nipple area of your breast. Feel the area with the pads of your three middle fingers by making small circles with your fingers. For the first circle, press lightly. For the second circle, press harder. For the third circle, press even harder. Keep making circles with your fingers at the different pressures as you move down your breast. Stop when you feel your ribs. Move your fingers a little toward the center of your body. Start making circles with your fingers again, this time going up until  you reach your collarbone. Keep making up-and-down circles until you reach your armpit. Remember to keep using the three pressures. Feel the other breast in the same way. Sit or stand in the tub or shower. With soapy water on your skin, feel each breast the same way you did in step 2 when you were lying on the floor.  Write down what you find Writing down what you find can help you remember what to tell your doctor. Write down: What is normal for each breast. Any changes you find in each breast, including: The kind of changes you find. Whether you have pain. Size and location of any lumps. When you last had your menstrual period. General tips Check your breasts every month. If you are breastfeeding, the best time to check your breasts is after you feed your baby or after you use a breast pump. If you get menstrual periods, the best time to check your breasts is 5-7 days after your menstrual period is over. With time, you will become comfortable with the self-exam, and you will begin to know if there are changes in your breasts. Contact a doctor if you: See a change in the shape or size of your breasts or nipples. See a change in the skin of your breast or nipples, such as red or scaly skin. Have fluid coming from your nipples that is not normal. Find a lump or thick area that was not there before. Have pain in   your breasts. Have any concerns about your breast health. Summary Breast self-awareness includes looking for changes in your breasts, as well as feeling for changes within your breasts. Breast self-awareness should be done in front of a mirror in a well-lit room. You should check your breasts every month. If you get menstrual periods, the best time to check your breasts is 5-7 days after your menstrual period is over. Let your doctor know of any changes you see in your breasts, including changes in size, changes on the skin, pain or tenderness, or fluid from your nipples that is  not normal. This information is not intended to replace advice given to you by your health care provider. Make sure you discuss any questions you have with your healthcare provider. Document Revised: 06/26/2018 Document Reviewed: 06/26/2018 Elsevier Patient Education  2022 Elsevier Inc. Preventive Care 21-39 Years Old, Female Preventive care refers to lifestyle choices and visits with your health care provider that can promote health and wellness. This includes: A yearly physical exam. This is also called an annual wellness visit. Regular dental and eye exams. Immunizations. Screening for certain conditions. Healthy lifestyle choices, such as: Eating a healthy diet. Getting regular exercise. Not using drugs or products that contain nicotine and tobacco. Limiting alcohol use. What can I expect for my preventive care visit? Physical exam Your health care provider may check your: Height and weight. These may be used to calculate your BMI (body mass index). BMI is a measurement that tells if you are at a healthy weight. Heart rate and blood pressure. Body temperature. Skin for abnormal spots. Counseling Your health care provider may ask you questions about your: Past medical problems. Family's medical history. Alcohol, tobacco, and drug use. Emotional well-being. Home life and relationship well-being. Sexual activity. Diet, exercise, and sleep habits. Work and work environment. Access to firearms. Method of birth control. Menstrual cycle. Pregnancy history. What immunizations do I need?  Vaccines are usually given at various ages, according to a schedule. Your health care provider will recommend vaccines for you based on your age, medicalhistory, and lifestyle or other factors, such as travel or where you work. What tests do I need?  Blood tests Lipid and cholesterol levels. These may be checked every 5 years starting at age 20. Hepatitis C test. Hepatitis B  test. Screening Diabetes screening. This is done by checking your blood sugar (glucose) after you have not eaten for a while (fasting). STD (sexually transmitted disease) testing, if you are at risk. BRCA-related cancer screening. This may be done if you have a family history of breast, ovarian, tubal, or peritoneal cancers. Pelvic exam and Pap test. This may be done every 3 years starting at age 21. Starting at age 30, this may be done every 5 years if you have a Pap test in combination with an HPV test. Talk with your health care provider about your test results, treatment options,and if necessary, the need for more tests. Follow these instructions at home: Eating and drinking  Eat a healthy diet that includes fresh fruits and vegetables, whole grains, lean protein, and low-fat dairy products. Take vitamin and mineral supplements as recommended by your health care provider. Do not drink alcohol if: Your health care provider tells you not to drink. You are pregnant, may be pregnant, or are planning to become pregnant. If you drink alcohol: Limit how much you have to 0-1 drink a day. Be aware of how much alcohol is in your drink. In the [place] one   drink equals one 12 oz bottle of beer (355 mL), one 5 oz glass of wine (148 mL), or one 1 oz glass of hard liquor (44 mL).  Lifestyle Take daily care of your teeth and gums. Brush your teeth every morning and night with fluoride toothpaste. Floss one time each day. Stay active. Exercise for at least 30 minutes 5 or more days each week. Do not use any products that contain nicotine or tobacco, such as cigarettes, e-cigarettes, and chewing tobacco. If you need help quitting, ask your health care provider. Do not use drugs. If you are sexually active, practice safe sex. Use a condom or other form of protection to prevent STIs (sexually transmitted infections). If you do not wish to become pregnant, use a form of birth control. If you plan to become  pregnant, see your health care provider for a prepregnancy visit. Find healthy ways to cope with stress, such as: Meditation, yoga, or listening to music. Journaling. Talking to a trusted person. Spending time with friends and family. Safety Always wear your seat belt while driving or riding in a vehicle. Do not drive: If you have been drinking alcohol. Do not ride with someone who has been drinking. When you are tired or distracted. While texting. Wear a helmet and other protective equipment during sports activities. If you have firearms in your house, make sure you follow all gun safety procedures. Seek help if you have been physically or sexually abused. What's next? Go to your health care provider once a year for an annual wellness visit. Ask your health care provider how often you should have your eyes and teeth checked. Stay up to date on all vaccines. This information is not intended to replace advice given to you by your health care provider. Make sure you discuss any questions you have with your healthcare provider. Document Revised: 07/05/2020 Document Reviewed: 07/19/2018 Elsevier Patient Education  2022 Elsevier Inc.  

## 2021-05-08 LAB — CBC
Hematocrit: 40.1 % (ref 34.0–46.6)
Hemoglobin: 13.8 g/dL (ref 11.1–15.9)
MCH: 31 pg (ref 26.6–33.0)
MCHC: 34.4 g/dL (ref 31.5–35.7)
MCV: 90 fL (ref 79–97)
Platelets: 220 10*3/uL (ref 150–450)
RBC: 4.45 x10E6/uL (ref 3.77–5.28)
RDW: 12.2 % (ref 11.7–15.4)
WBC: 5.5 10*3/uL (ref 3.4–10.8)

## 2021-05-08 LAB — COMPREHENSIVE METABOLIC PANEL
ALT: 39 IU/L — ABNORMAL HIGH (ref 0–32)
AST: 22 IU/L (ref 0–40)
Albumin/Globulin Ratio: 1.9 (ref 1.2–2.2)
Albumin: 5 g/dL — ABNORMAL HIGH (ref 3.8–4.8)
Alkaline Phosphatase: 125 IU/L — ABNORMAL HIGH (ref 44–121)
BUN/Creatinine Ratio: 16 (ref 9–23)
BUN: 12 mg/dL (ref 6–20)
Bilirubin Total: 0.5 mg/dL (ref 0.0–1.2)
CO2: 24 mmol/L (ref 20–29)
Calcium: 10 mg/dL (ref 8.7–10.2)
Chloride: 100 mmol/L (ref 96–106)
Creatinine, Ser: 0.74 mg/dL (ref 0.57–1.00)
Globulin, Total: 2.6 g/dL (ref 1.5–4.5)
Glucose: 77 mg/dL (ref 65–99)
Potassium: 4.3 mmol/L (ref 3.5–5.2)
Sodium: 139 mmol/L (ref 134–144)
Total Protein: 7.6 g/dL (ref 6.0–8.5)
eGFR: 107 mL/min/{1.73_m2} (ref >59)

## 2021-05-08 LAB — LIPID PANEL
Chol/HDL Ratio: 3.4 ratio (ref 0.0–4.4)
Cholesterol, Total: 237 mg/dL — ABNORMAL HIGH (ref 100–199)
HDL: 69 mg/dL (ref >39)
LDL Chol Calc (NIH): 141 mg/dL — ABNORMAL HIGH (ref 0–99)
Triglycerides: 154 mg/dL — ABNORMAL HIGH (ref 0–149)
VLDL Cholesterol Cal: 27 mg/dL (ref 5–40)

## 2021-05-08 LAB — HEMOGLOBIN A1C
Est. average glucose Bld gHb Est-mCnc: 103 mg/dL
Hgb A1c MFr Bld: 5.2 % (ref 4.8–5.6)

## 2021-05-08 LAB — TSH: TSH: 1.38 u[IU]/mL (ref 0.450–4.500)

## 2021-05-11 LAB — CYTOLOGY - PAP: Diagnosis: NEGATIVE

## 2021-05-12 ENCOUNTER — Telehealth: Payer: Self-pay | Admitting: Obstetrics and Gynecology

## 2021-05-12 NOTE — Telephone Encounter (Signed)
Patient called requesting update on paperwork confirming she had her physical exam. Please Advise.

## 2021-05-13 NOTE — Telephone Encounter (Signed)
Spoke to pt and she is aware that her information has to been faxed in.

## 2021-05-18 ENCOUNTER — Encounter: Payer: 59 | Admitting: Obstetrics and Gynecology

## 2021-05-18 DIAGNOSIS — M9903 Segmental and somatic dysfunction of lumbar region: Secondary | ICD-10-CM | POA: Diagnosis not present

## 2021-05-18 DIAGNOSIS — M9901 Segmental and somatic dysfunction of cervical region: Secondary | ICD-10-CM | POA: Diagnosis not present

## 2021-05-18 DIAGNOSIS — M461 Sacroiliitis, not elsewhere classified: Secondary | ICD-10-CM | POA: Diagnosis not present

## 2021-05-18 DIAGNOSIS — M9902 Segmental and somatic dysfunction of thoracic region: Secondary | ICD-10-CM | POA: Diagnosis not present

## 2021-05-18 DIAGNOSIS — M9904 Segmental and somatic dysfunction of sacral region: Secondary | ICD-10-CM | POA: Diagnosis not present

## 2021-05-18 DIAGNOSIS — M4603 Spinal enthesopathy, cervicothoracic region: Secondary | ICD-10-CM | POA: Diagnosis not present

## 2021-05-26 ENCOUNTER — Other Ambulatory Visit: Payer: Self-pay

## 2021-05-26 DIAGNOSIS — J45990 Exercise induced bronchospasm: Secondary | ICD-10-CM

## 2021-05-27 MED ORDER — ALBUTEROL SULFATE HFA 108 (90 BASE) MCG/ACT IN AERS: 1.0000 | INHALATION_SPRAY | Freq: Four times a day (QID) | RESPIRATORY_TRACT | 3 refills | Status: DC | PRN

## 2021-06-15 ENCOUNTER — Other Ambulatory Visit: Payer: Self-pay

## 2021-06-15 ENCOUNTER — Telehealth: Payer: 59 | Admitting: Physician Assistant

## 2021-06-15 DIAGNOSIS — R059 Cough, unspecified: Secondary | ICD-10-CM

## 2021-06-15 NOTE — Progress Notes (Signed)
   Subjective: Cough    Patient ID: Diana Brooks, female    DOB: Jul 27, 1981, 40 y.o.   MRN: [id number]  HPI Patient states having persisting cough for 3 days.  Patient states 53-monthold daughter is sick and pediatrician started infant on antibiotics for pneumonia.  Patient is requesting antibiotics.  Patient states cough is worse at night with laying down.  Patient stated no fever or chills.  Patient does not want to be tested for COVID.  Patient did not want to take chest x-ray.  Patient is breast-feeding.  Advised most prescription cough medications on the counter-indicated doing breast-feeding.   Review of Systems Negative except for complaint    Objective:   Physical Exam  This was a virtual visit.      Assessment & Plan: Cough   Discussed with patient rationale for not prescribing antibiotics without clear diagnosis of pneumonia or bacterial infection.  Patient refused chest x-ray.  Patient refused COVID testing.  Patient refused codeine-based cough medication secondary to intolerance as she is breast-feeding.  Patient was given the option to express breastmilk and store for baby taking cough medicine.  Patient advised at this time to consider over-the-counter cough medicine due to her breast-feeding.

## 2021-06-15 NOTE — Progress Notes (Signed)
Cough started Sunday and 70 month old baby is sick and put on antibiotic treating for sinus infection and she is breast feeding. Denies fever.

## 2021-06-16 DIAGNOSIS — M9903 Segmental and somatic dysfunction of lumbar region: Secondary | ICD-10-CM | POA: Diagnosis not present

## 2021-06-16 DIAGNOSIS — M9902 Segmental and somatic dysfunction of thoracic region: Secondary | ICD-10-CM | POA: Diagnosis not present

## 2021-06-16 DIAGNOSIS — M461 Sacroiliitis, not elsewhere classified: Secondary | ICD-10-CM | POA: Diagnosis not present

## 2021-06-16 DIAGNOSIS — M9901 Segmental and somatic dysfunction of cervical region: Secondary | ICD-10-CM | POA: Diagnosis not present

## 2021-06-16 DIAGNOSIS — M4603 Spinal enthesopathy, cervicothoracic region: Secondary | ICD-10-CM | POA: Diagnosis not present

## 2021-06-16 DIAGNOSIS — M9904 Segmental and somatic dysfunction of sacral region: Secondary | ICD-10-CM | POA: Diagnosis not present

## 2021-06-29 DIAGNOSIS — M5387 Other specified dorsopathies, lumbosacral region: Secondary | ICD-10-CM | POA: Diagnosis not present

## 2021-06-29 DIAGNOSIS — M9903 Segmental and somatic dysfunction of lumbar region: Secondary | ICD-10-CM | POA: Diagnosis not present

## 2021-06-29 DIAGNOSIS — M9904 Segmental and somatic dysfunction of sacral region: Secondary | ICD-10-CM | POA: Diagnosis not present

## 2021-06-29 DIAGNOSIS — M9902 Segmental and somatic dysfunction of thoracic region: Secondary | ICD-10-CM | POA: Diagnosis not present

## 2021-06-29 DIAGNOSIS — M4603 Spinal enthesopathy, cervicothoracic region: Secondary | ICD-10-CM | POA: Diagnosis not present

## 2021-06-29 DIAGNOSIS — M9901 Segmental and somatic dysfunction of cervical region: Secondary | ICD-10-CM | POA: Diagnosis not present

## 2021-07-05 DIAGNOSIS — M5387 Other specified dorsopathies, lumbosacral region: Secondary | ICD-10-CM | POA: Diagnosis not present

## 2021-07-05 DIAGNOSIS — M9904 Segmental and somatic dysfunction of sacral region: Secondary | ICD-10-CM | POA: Diagnosis not present

## 2021-07-05 DIAGNOSIS — M9903 Segmental and somatic dysfunction of lumbar region: Secondary | ICD-10-CM | POA: Diagnosis not present

## 2021-07-05 DIAGNOSIS — M9901 Segmental and somatic dysfunction of cervical region: Secondary | ICD-10-CM | POA: Diagnosis not present

## 2021-07-05 DIAGNOSIS — M9902 Segmental and somatic dysfunction of thoracic region: Secondary | ICD-10-CM | POA: Diagnosis not present

## 2021-07-05 DIAGNOSIS — M4603 Spinal enthesopathy, cervicothoracic region: Secondary | ICD-10-CM | POA: Diagnosis not present

## 2021-07-13 DIAGNOSIS — M9901 Segmental and somatic dysfunction of cervical region: Secondary | ICD-10-CM | POA: Diagnosis not present

## 2021-07-13 DIAGNOSIS — M9904 Segmental and somatic dysfunction of sacral region: Secondary | ICD-10-CM | POA: Diagnosis not present

## 2021-07-13 DIAGNOSIS — M5387 Other specified dorsopathies, lumbosacral region: Secondary | ICD-10-CM | POA: Diagnosis not present

## 2021-07-13 DIAGNOSIS — M9903 Segmental and somatic dysfunction of lumbar region: Secondary | ICD-10-CM | POA: Diagnosis not present

## 2021-07-13 DIAGNOSIS — M4603 Spinal enthesopathy, cervicothoracic region: Secondary | ICD-10-CM | POA: Diagnosis not present

## 2021-07-13 DIAGNOSIS — M9902 Segmental and somatic dysfunction of thoracic region: Secondary | ICD-10-CM | POA: Diagnosis not present

## 2021-07-22 ENCOUNTER — Ambulatory Visit: Payer: Self-pay | Admitting: Physician Assistant

## 2021-07-22 ENCOUNTER — Encounter: Payer: Self-pay | Admitting: Physician Assistant

## 2021-07-22 ENCOUNTER — Other Ambulatory Visit: Payer: Self-pay

## 2021-07-22 VITALS — BP 105/73 | HR 78 | Temp 98.2°F | Resp 12 | Ht 64.0 in | Wt 144.0 lb

## 2021-07-22 DIAGNOSIS — R590 Localized enlarged lymph nodes: Secondary | ICD-10-CM

## 2021-07-22 DIAGNOSIS — H5203 Hypermetropia, bilateral: Secondary | ICD-10-CM | POA: Diagnosis not present

## 2021-07-22 NOTE — Progress Notes (Signed)
Pt noticed Small lump under left arm pit about a week ago and mentioned she is breastfeeding. CL,RMA

## 2021-07-22 NOTE — Progress Notes (Signed)
   Subjective: Left axillary lesion    Patient ID: Diana Brooks, female    DOB: 1981-09-09, 40 y.o.   MRN: [id number]  HPI Patient presents with 4 small left axillary lesion x1 week.  Patient states concern for cancer.  Patient is breast-feeding.  Patient also shaved the area last night.   Review of Systems Negative except for complaint.    Objective:   Physical Exam  Appears anxious.  Temperature is 98.2, pulse 78, respiration 12, BP is 105/73, patient 97% O2 sat on room air.  Patient with 144 pounds and BMI is 24.7. Examination left axillary area reveals a 1 to 2 mm palpable but not visible lesion.  Nontender to palpation.  No drainage.      Assessment & Plan: Axillary adenopathy   Patient advised to not shave area for the next 2 weeks.  Apply warm compress to area return back to clinic if lesion grows in size or drainage is noticeable.

## 2021-08-11 DIAGNOSIS — M9902 Segmental and somatic dysfunction of thoracic region: Secondary | ICD-10-CM | POA: Diagnosis not present

## 2021-08-11 DIAGNOSIS — M5387 Other specified dorsopathies, lumbosacral region: Secondary | ICD-10-CM | POA: Diagnosis not present

## 2021-08-11 DIAGNOSIS — M4603 Spinal enthesopathy, cervicothoracic region: Secondary | ICD-10-CM | POA: Diagnosis not present

## 2021-08-11 DIAGNOSIS — M9903 Segmental and somatic dysfunction of lumbar region: Secondary | ICD-10-CM | POA: Diagnosis not present

## 2021-08-11 DIAGNOSIS — M9901 Segmental and somatic dysfunction of cervical region: Secondary | ICD-10-CM | POA: Diagnosis not present

## 2021-08-11 DIAGNOSIS — M9904 Segmental and somatic dysfunction of sacral region: Secondary | ICD-10-CM | POA: Diagnosis not present

## 2021-08-25 DIAGNOSIS — M9904 Segmental and somatic dysfunction of sacral region: Secondary | ICD-10-CM | POA: Diagnosis not present

## 2021-08-25 DIAGNOSIS — M9902 Segmental and somatic dysfunction of thoracic region: Secondary | ICD-10-CM | POA: Diagnosis not present

## 2021-08-25 DIAGNOSIS — M5387 Other specified dorsopathies, lumbosacral region: Secondary | ICD-10-CM | POA: Diagnosis not present

## 2021-08-25 DIAGNOSIS — M4603 Spinal enthesopathy, cervicothoracic region: Secondary | ICD-10-CM | POA: Diagnosis not present

## 2021-08-25 DIAGNOSIS — M9901 Segmental and somatic dysfunction of cervical region: Secondary | ICD-10-CM | POA: Diagnosis not present

## 2021-08-25 DIAGNOSIS — M9903 Segmental and somatic dysfunction of lumbar region: Secondary | ICD-10-CM | POA: Diagnosis not present

## 2021-09-09 ENCOUNTER — Ambulatory Visit: Payer: Self-pay | Admitting: Physician Assistant

## 2021-09-09 ENCOUNTER — Encounter: Payer: Self-pay | Admitting: Physician Assistant

## 2021-09-09 DIAGNOSIS — B349 Viral infection, unspecified: Secondary | ICD-10-CM

## 2021-09-09 DIAGNOSIS — J45990 Exercise induced bronchospasm: Secondary | ICD-10-CM

## 2021-09-09 MED ORDER — ALBUTEROL SULFATE HFA 108 (90 BASE) MCG/ACT IN AERS: 1.0000 | INHALATION_SPRAY | Freq: Four times a day (QID) | RESPIRATORY_TRACT | 3 refills | Status: AC | PRN

## 2021-09-09 MED ORDER — PSEUDOEPH-BROMPHEN-DM 30-2-10 MG/5ML PO SYRP: 5.0000 mL | ORAL_SOLUTION | Freq: Four times a day (QID) | ORAL | 0 refills | Status: DC | PRN

## 2021-09-09 NOTE — Progress Notes (Signed)
   Subjective: Viral illness    Patient ID: Diana Brooks, female    DOB: 1981-09-30, 40 y.o.   MRN: [id number]  HPI Patient was diagnosed with COVID-19 5 days ago.  Patient states she is continued having cough and mild wheezing.  Patient has a history of asthma.  Patient has just finished steroids antibiotics.  The antibiotics and for pending dental procedure.   Review of Systems Reactive airway and vitamin D deficiency.    Objective:   Physical Exam Virtual visit       Assessment & Plan: Viral illness   Per discussed with patient a prescription for Bromfed-DM and Ventolin inhaler was sent to the pharmacy.  Patient advised to follow-up telephonically if there is no improvement in 24 hours.

## 2021-09-09 NOTE — Progress Notes (Signed)
Pt had home test that was positive about 5-6 days ago. Pt stating she has asthma with a bad cough and fatigued. Pt describes it as a dry cough through out the day up until today she see's a light color yellow in mucous this morning.  Pt needs new rx for inhaler.

## 2021-09-15 ENCOUNTER — Encounter: Payer: Self-pay | Admitting: Physician Assistant

## 2021-09-15 ENCOUNTER — Other Ambulatory Visit: Payer: Self-pay | Admitting: Physician Assistant

## 2021-09-15 VITALS — Temp 101.3°F

## 2021-09-15 DIAGNOSIS — R059 Cough, unspecified: Secondary | ICD-10-CM

## 2021-09-15 LAB — POCT INFLUENZA A/B
Influenza A, POC: NEGATIVE
Influenza B, POC: NEGATIVE

## 2021-09-15 MED ORDER — BENZONATATE 200 MG PO CAPS: 200.0000 mg | ORAL_CAPSULE | Freq: Three times a day (TID) | ORAL | 0 refills | Status: DC | PRN

## 2021-09-15 MED ORDER — AZITHROMYCIN 250 MG PO TABS: ORAL_TABLET | ORAL | 0 refills | Status: AC

## 2021-09-15 MED ORDER — FEXOFENADINE-PSEUDOEPHED ER 60-120 MG PO TB12: 1.0000 | ORAL_TABLET | Freq: Two times a day (BID) | ORAL | 0 refills | Status: DC

## 2021-09-15 NOTE — Progress Notes (Signed)
2 wks S/P Covid "Coughing like crazy" this morning "Feels feverish" - hasn't checked temp - No thermometer ":O2 fine" - have oximetry  Lungs clear bilaterally Temp = 101.3 Pressure under eyes Headache  AMD

## 2021-09-15 NOTE — Progress Notes (Signed)
   Subjective: Sinus congestion and cough    Patient ID: Diana Brooks, female    DOB: 1981-04-25, 40 y.o.   MRN: [id number]  HPI Patient is status post 2 weeks diagnosed with COVID-19.  States the pain is 4 days she has had increased sinus pressure postnasal drainage and nonproductive cough.  Patient also states fever/chills.  Patient states intermitting nausea and diarrhea.  No vomiting.  Patient tested negative for influenza today.   Review of Systems Gestational diabetes and hyperlipidemia.    Objective:   Physical Exam This was a virtual visit.       Assessment & Plan: Sinusitis.   Patient given discharge care instruction and prescription for Cipro, Tessalon pearls, fexofenadine with Sudafed.  Patient advised to follow-up in 3 to 5 days if no improvement or worsening complaint.

## 2021-09-24 ENCOUNTER — Encounter: Payer: Self-pay | Admitting: Emergency Medicine

## 2021-09-24 ENCOUNTER — Ambulatory Visit (INDEPENDENT_AMBULATORY_CARE_PROVIDER_SITE_OTHER): Payer: 59

## 2021-09-24 ENCOUNTER — Ambulatory Visit
Admission: EM | Admit: 2021-09-24 | Discharge: 2021-09-24 | Disposition: A | Discharge status: Home / Self Care | Payer: 59 | Attending: Emergency Medicine | Admitting: Emergency Medicine

## 2021-09-24 ENCOUNTER — Telehealth: Payer: Self-pay

## 2021-09-24 ENCOUNTER — Other Ambulatory Visit: Payer: Self-pay

## 2021-09-24 DIAGNOSIS — R093 Abnormal sputum: Secondary | ICD-10-CM

## 2021-09-24 DIAGNOSIS — R059 Cough, unspecified: Secondary | ICD-10-CM

## 2021-09-24 DIAGNOSIS — R058 Other specified cough: Secondary | ICD-10-CM

## 2021-09-24 DIAGNOSIS — J0100 Acute maxillary sinusitis, unspecified: Secondary | ICD-10-CM

## 2021-09-24 DIAGNOSIS — J069 Acute upper respiratory infection, unspecified: Secondary | ICD-10-CM | POA: Diagnosis not present

## 2021-09-24 MED ORDER — DOXYCYCLINE HYCLATE 100 MG PO CAPS: 100.0000 mg | ORAL_CAPSULE | Freq: Two times a day (BID) | ORAL | 0 refills | Status: DC

## 2021-09-24 NOTE — Telephone Encounter (Signed)
Reviewed Nyoka's info with Ron.  He states she's been on two antibiotics (Clindamycin & Zpack) that would treat sinusitis.  She just completed the Zpack 11/19/2021 - zpack in your system for 10 days upon completion & feels too soon to be put on another antibiotic.  Support measures - continue meds he previously prescribed, OTC Cold Meds, Increase po fluids, warm steamy showers, humidifier, warm tea, peppermint.  States she can schedule an appt with him on Monday.  Called Curt Bears.  Reviewed Ron's instructions & offered to schedule an appt for Monday with him.  States she's presently at an Urgent Care & they just did a chest xray & she's waiting for the provider to come back to see her.  AMD

## 2021-09-24 NOTE — Discharge Instructions (Addendum)
Your chest x-ray is normal.  Take the Tessalon Perles as needed for cough.  Take the doxycycline as directed.  Follow up with your primary care provider if your symptoms are not improving.

## 2021-09-24 NOTE — Telephone Encounter (Signed)
Diana Brooks called today around 11:40 am stating she just had a telehealth visit through [facility] provider told her there is nothing she can do for her without being seen.  Very upset & almost tearful.  09/15/2021 you prescribed a Z-pack which she completed on 09/19/2021.  States she felt good until Wednesday (09/22/2021) & then on Wednesday night symptoms returned.  S/Sx: Coughing up green phlegm Nasal congestion Cheeks hurting some Ears felt stuffy this morning Tooth on top left hurting more than usual (had Clindamycin from dentist at beginning of October) Denies fever  States she felt good while taking the Zpack & for a couple of days after. Requesting to be on another antibiotic.  States "I just want to be over this".  Please advise.  AMD

## 2021-09-24 NOTE — ED Triage Notes (Signed)
Pt had Covid 3 weeks ago. 2 weeks after being dx she developed cold sxs and was prescribed a zpak. She felt better and 3 days after the zpak was completed her sxs returned. She has a productive cough with green mucus, runny nose and sinus pressure.

## 2021-09-24 NOTE — ED Provider Notes (Signed)
Diana Brooks    CSN: [id number] Arrival date & time: 09/24/21  1438      History   Chief Complaint Chief Complaint  Patient presents with   Cough   Nasal Congestion    HPI Diana Brooks is a 40 y.o. female.  Patient presents with cough productive of green sputum, sinus congestion, sinus pressure, runny nose x2 days.  She tested positive for COVID at home on 09/04/2021.  She was seen at another clinic on 09/09/2021; diagnosed with viral illness and mild exercise-induced asthma; treated symptomatically.  She was seen by occupational health at her employer last week and treated with Zithromax for a sinus infection.  Her symptoms improved but then she became worse 2 days ago.  No fever, shortness of breath, or other symptoms.  She is taking Best boy as needed for her cough.  Her medical history includes asthma, migraine headaches, IBS, anemia.  The history is provided by the patient and medical records.   Past Medical History:  Diagnosis Date   Anemia    Anxiety    Asthma    exercise induced   Complication of anesthesia    Dysmenorrhea    Endometriosis    Family history of adverse reaction to anesthesia    N/V from mother   GDM (gestational diabetes mellitus)    Hyperprolactinemia ([place])    IBS (irritable bowel syndrome)    Infertility, female    PONV (postoperative nausea and vomiting)    states phenergan helps alot post op n/v    Patient Active Problem List   Diagnosis Date Noted   Hx of migraines 06/27/2020   Mild exercise-induced asthma 01/23/2020   BMI 25.0-25.9,adult 01/23/2020   Hyperlipidemia LDL goal <100 01/23/2020   History of gestational diabetes 01/23/2020   History of squamous cell carcinoma excision 01/23/2020   History of dysplastic nevus 01/23/2020   Ganglion of wrist 01/22/2020   Endometriosis 10/22/2015   Infertility, female, secondary 10/22/2015   Cyst of nipple 10/22/2015    Past Surgical History:  Procedure Laterality Date    CESAREAN SECTION  2013   CESAREAN SECTION N/A 12/24/2020   Procedure: CESAREAN SECTION;  Surgeon: Diana Maid, MD;  Location: [facility] OR[facility];  Service: Obstetrics;  Laterality: N/A;  REPEAT CESAREAN SECTION   CHROMOPERTUBATION N/A 01/11/2016   Procedure: CHROMOPERTUBATION OF FALLOPIAN TUBES;  Surgeon: Diana Mars, MD;  Location: [facility] OR[facility];  Service: Gynecology;  Laterality: N/A;   CHROMOPERTUBATION  2012   GANGLION CYST EXCISION Left    wrist   LAPAROSCOPY     lap with excision of fulguration of endometriosis   LAPAROSCOPY N/A 01/11/2016   Procedure: LAPAROSCOPY DIAGNOSTIC/ WITH EXCISION AND FULGERATION OF ENDOMETRIOSIS; MYOMECTOMY;  Surgeon: Diana Mars, MD;  Location: [facility];  Service: Gynecology;  Laterality: N/A;   skin cancer removal  03/10/2021   forehead    OB History     Gravida  2   Para  2   Term  2   Preterm      AB      Living  2      SAB      IAB      Ectopic      Multiple  0   Live Births  2            Home Medications    Prior to Admission medications   Medication Sig Start Date End Date Taking? Authorizing Provider  doxycycline (VIBRAMYCIN) 100 MG capsule Take  1 capsule (100 mg total) by mouth 2 (two) times daily. 09/24/21  Yes Sharion Balloon, NP  acetaminophen (TYLENOL) 500 MG tablet Take 500-1,000 mg by mouth every 6 (six) hours as needed (for pain.). Patient not taking: Reported on 09/15/2021    [provider]  albuterol (VENTOLIN HFA) 108 (90 Base) MCG/ACT inhaler Inhale 1-2 puffs into the lungs every 6 (six) hours as needed for wheezing or shortness of breath. 09/09/21   Sable Feil, PA-C  benzonatate (TESSALON) 200 MG capsule Take 1 capsule (200 mg total) by mouth 3 (three) times daily as needed for cough. 09/15/21   Sable Feil, PA-C  brompheniramine-pseudoephedrine-DM 30-2-10 MG/5ML syrup Take 5 mLs by mouth 4 (four) times daily as needed. 09/09/21   Sable Feil, PA-C  Cholecalciferol (VITAMIN D-3)  125 MCG (5000 UT) TABS Take 5,000 Units by mouth 3 (three) times a week. Patient not taking: Reported on 09/09/2021    [provider]  clindamycin (CLEOCIN) 300 MG capsule Take 300 mg by mouth every 6 (six) hours. Finishing last dose today. 08/24/21   [provider]  fexofenadine-pseudoephedrine (ALLEGRA-D) 60-120 MG 12 hr tablet Take 1 tablet by mouth 2 (two) times daily. 09/15/21   Sable Feil, PA-C  ibuprofen (ADVIL) 800 MG tablet Take 800 mg by mouth 3 (three) times daily. As needed. Patient not taking: Reported on 09/15/2021 08/24/21   [provider]  Roma Schanz test strip SMARTSIG:Via Meter 3-4 Times Daily 03/15/21   [provider]  Prenatal Vit-Fe Fumarate-FA (MULTIVITAMIN-PRENATAL) 27-0.8 MG TABS tablet Take 1 tablet by mouth every evening.    [provider]    Family History Family History  Problem Relation Age of Onset   Endometriosis Mother    Rheum arthritis Mother    Diabetes Paternal Grandfather    Prostate cancer Maternal Uncle    Healthy Father    Heart disease Neg Hx    Breast cancer Neg Hx    Ovarian cancer Neg Hx    Colon cancer Neg Hx     Social History Social History   Tobacco Use   Smoking status: Never   Smokeless tobacco: Never  Vaping Use   Vaping Use: Never used  Substance Use Topics   Alcohol use: Not Currently    Comment: rare   Drug use: No     Allergies   Levaquin [levofloxacin in d5w], Other, Percocet [oxycodone-acetaminophen], Vicodin [hydrocodone-acetaminophen], Codeine, Sulfa antibiotics, and Suprax [cefixime]   Review of Systems Review of Systems  Constitutional:  Negative for chills and fever.  HENT:  Positive for congestion and rhinorrhea. Negative for ear pain and sore throat.   Respiratory:  Positive for cough. Negative for shortness of breath.   Cardiovascular:  Negative for chest pain and palpitations.  Gastrointestinal:  Negative for diarrhea and vomiting.  Skin:   Negative for color change and rash.  All other systems reviewed and are negative.   Physical Exam Triage Vital Signs ED Triage Vitals  Enc Vitals Group     BP      Pulse      Resp      Temp      Temp src      SpO2      Weight      Height      Head Circumference      Peak Flow      Pain Score      Pain Loc      Pain  Edu?      Excl. in [place]?    No data found.  Updated Vital Signs BP 132/80 (BP Location: Left Arm)   Pulse 80   Temp 98.9 F (37.2 C) (Oral)   Resp 18   LMP 08/31/2021   SpO2 98%   Visual Acuity Right Eye Distance:   Left Eye Distance:   Bilateral Distance:    Right Eye Near:   Left Eye Near:    Bilateral Near:     Physical Exam Vitals and nursing note reviewed.  Constitutional:      General: She is not in acute distress.    Appearance: She is well-developed. She is not ill-appearing.  HENT:     Head: Normocephalic and atraumatic.     Right Ear: Tympanic membrane normal.     Left Ear: Tympanic membrane normal.     Nose: Congestion and rhinorrhea present.     Mouth/Throat:     Mouth: Mucous membranes are moist.     Pharynx: Oropharynx is clear.  Eyes:     Conjunctiva/sclera: Conjunctivae normal.  Cardiovascular:     Rate and Rhythm: Normal rate and regular rhythm.     Heart sounds: Normal heart sounds.  Pulmonary:     Effort: Pulmonary effort is normal. No respiratory distress.     Breath sounds: Normal breath sounds. No wheezing or rhonchi.  Abdominal:     Palpations: Abdomen is soft.     Tenderness: There is no abdominal tenderness.  Musculoskeletal:     Cervical back: Neck supple.  Skin:    General: Skin is warm and dry.  Neurological:     Mental Status: She is alert.  Psychiatric:        Mood and Affect: Mood normal.        Behavior: Behavior normal.     UC Treatments / Results  Labs (all labs ordered are listed, but only abnormal results are displayed) Labs Reviewed - No data to display  EKG   Radiology DG Chest 2  View  Result Date: 09/24/2021 CLINICAL DATA:  Had COVID-19 3 weeks ago, productive cough with green mucus, runny nose, sinus pressure EXAM: CHEST - 2 VIEW COMPARISON:  11/07/2012 FINDINGS: Normal heart size, mediastinal contours, and pulmonary vascularity. Lungs clear. No pleural effusion or pneumothorax. Bones unremarkable. IMPRESSION: Normal exam. Electronically Signed   By: Lavonia Dana M.D.   On: 09/24/2021 16:20    Procedures Procedures (including critical care time)  Medications Ordered in UC Medications - No data to display  Initial Impression / Assessment and Plan / UC Course  I have reviewed the triage vital signs and the nursing notes.  Pertinent labs & imaging results that were available during my care of the patient were reviewed by me and considered in my medical decision making (see chart for details).  Acute sinusitis, productive cough.  Chest x-ray normal.  Instructed patient to continue taking the [facility] as needed for cough.  Treating sinusitis with doxycycline.  Instructed patient to follow-up with her PCP if her symptoms are not improving.  She agrees to plan of care.   Final Clinical Impressions(s) / UC Diagnoses   Final diagnoses:  Acute non-recurrent maxillary sinusitis  Productive cough     Discharge Instructions      Your chest x-ray is normal.  Take the Tessalon Perles as needed for cough.  Take the doxycycline as directed.  Follow up with your primary care provider if your symptoms are not improving.  ED Prescriptions     Medication Sig Dispense Auth. Provider   doxycycline (VIBRAMYCIN) 100 MG capsule Take 1 capsule (100 mg total) by mouth 2 (two) times daily. 20 capsule Sharion Balloon, NP      I have reviewed the PDMP during this encounter.   Sharion Balloon, NP 09/24/21 [phone]

## 2021-10-05 ENCOUNTER — Other Ambulatory Visit: Payer: Self-pay

## 2021-10-05 DIAGNOSIS — Z0000 Encounter for general adult medical examination without abnormal findings: Secondary | ICD-10-CM

## 2021-10-05 NOTE — Progress Notes (Signed)
Labs at patients request, S/P covid.

## 2021-10-06 LAB — CMP12+LP+TP+TSH+6AC+CBC/D/PLT
ALT: 27 IU/L (ref 0–32)
AST: 26 IU/L (ref 0–40)
Albumin/Globulin Ratio: 1.6 (ref 1.2–2.2)
Albumin: 4.4 g/dL (ref 3.8–4.8)
Alkaline Phosphatase: 106 IU/L (ref 44–121)
BUN/Creatinine Ratio: 18 (ref 9–23)
BUN: 14 mg/dL (ref 6–20)
Basophils Absolute: 0 10*3/uL (ref 0.0–0.2)
Basos: 1 %
Bilirubin Total: 0.3 mg/dL (ref 0.0–1.2)
Calcium: 9.6 mg/dL (ref 8.7–10.2)
Chloride: 104 mmol/L (ref 96–106)
Chol/HDL Ratio: 4.6 ratio — ABNORMAL HIGH (ref 0.0–4.4)
Cholesterol, Total: 207 mg/dL — ABNORMAL HIGH (ref 100–199)
Creatinine, Ser: 0.76 mg/dL (ref 0.57–1.00)
EOS (ABSOLUTE): 0.1 10*3/uL (ref 0.0–0.4)
Eos: 2 %
Estimated CHD Risk: 1.1 times avg. — ABNORMAL HIGH (ref 0.0–1.0)
Free Thyroxine Index: 2 (ref 1.2–4.9)
GGT: 22 IU/L (ref 0–60)
Globulin, Total: 2.8 g/dL (ref 1.5–4.5)
Glucose: 92 mg/dL (ref 70–99)
HDL: 45 mg/dL (ref >39)
Hematocrit: 40.5 % (ref 34.0–46.6)
Hemoglobin: 13.6 g/dL (ref 11.1–15.9)
Immature Grans (Abs): 0 10*3/uL (ref 0.0–0.1)
Immature Granulocytes: 0 %
Iron: 53 ug/dL (ref 27–159)
LDH: 136 IU/L (ref 119–226)
LDL Chol Calc (NIH): 138 mg/dL — ABNORMAL HIGH (ref 0–99)
Lymphocytes Absolute: 1.8 10*3/uL (ref 0.7–3.1)
Lymphs: 37 %
MCH: 29.4 pg (ref 26.6–33.0)
MCHC: 33.6 g/dL (ref 31.5–35.7)
MCV: 88 fL (ref 79–97)
Monocytes Absolute: 0.3 10*3/uL (ref 0.1–0.9)
Monocytes: 7 %
Neutrophils Absolute: 2.5 10*3/uL (ref 1.4–7.0)
Neutrophils: 53 %
Phosphorus: 3.4 mg/dL (ref 3.0–4.3)
Platelets: 255 10*3/uL (ref 150–450)
Potassium: 4.2 mmol/L (ref 3.5–5.2)
RBC: 4.63 x10E6/uL (ref 3.77–5.28)
RDW: 12.2 % (ref 11.7–15.4)
Sodium: 139 mmol/L (ref 134–144)
T3 Uptake Ratio: 27 % (ref 24–39)
T4, Total: 7.5 ug/dL (ref 4.5–12.0)
TSH: 1.82 u[IU]/mL (ref 0.450–4.500)
Total Protein: 7.2 g/dL (ref 6.0–8.5)
Triglycerides: 133 mg/dL (ref 0–149)
Uric Acid: 3.3 mg/dL (ref 2.6–6.2)
VLDL Cholesterol Cal: 24 mg/dL (ref 5–40)
WBC: 4.7 10*3/uL (ref 3.4–10.8)
eGFR: 103 mL/min/{1.73_m2} (ref >59)

## 2021-10-06 LAB — VITAMIN D 25 HYDROXY (VIT D DEFICIENCY, FRACTURES): Vit D, 25-Hydroxy: 36.5 ng/mL (ref 30.0–100.0)

## 2021-10-12 DIAGNOSIS — M9904 Segmental and somatic dysfunction of sacral region: Secondary | ICD-10-CM | POA: Diagnosis not present

## 2021-10-12 DIAGNOSIS — M9903 Segmental and somatic dysfunction of lumbar region: Secondary | ICD-10-CM | POA: Diagnosis not present

## 2021-10-12 DIAGNOSIS — M9902 Segmental and somatic dysfunction of thoracic region: Secondary | ICD-10-CM | POA: Diagnosis not present

## 2021-10-12 DIAGNOSIS — M5387 Other specified dorsopathies, lumbosacral region: Secondary | ICD-10-CM | POA: Diagnosis not present

## 2021-10-12 DIAGNOSIS — M9901 Segmental and somatic dysfunction of cervical region: Secondary | ICD-10-CM | POA: Diagnosis not present

## 2021-10-12 DIAGNOSIS — M4603 Spinal enthesopathy, cervicothoracic region: Secondary | ICD-10-CM | POA: Diagnosis not present

## 2021-10-26 DIAGNOSIS — M5387 Other specified dorsopathies, lumbosacral region: Secondary | ICD-10-CM | POA: Diagnosis not present

## 2021-10-26 DIAGNOSIS — M9901 Segmental and somatic dysfunction of cervical region: Secondary | ICD-10-CM | POA: Diagnosis not present

## 2021-10-26 DIAGNOSIS — M9902 Segmental and somatic dysfunction of thoracic region: Secondary | ICD-10-CM | POA: Diagnosis not present

## 2021-10-26 DIAGNOSIS — M9904 Segmental and somatic dysfunction of sacral region: Secondary | ICD-10-CM | POA: Diagnosis not present

## 2021-10-26 DIAGNOSIS — M9903 Segmental and somatic dysfunction of lumbar region: Secondary | ICD-10-CM | POA: Diagnosis not present

## 2021-10-26 DIAGNOSIS — M4603 Spinal enthesopathy, cervicothoracic region: Secondary | ICD-10-CM | POA: Diagnosis not present

## 2021-11-11 DIAGNOSIS — M9904 Segmental and somatic dysfunction of sacral region: Secondary | ICD-10-CM | POA: Diagnosis not present

## 2021-11-11 DIAGNOSIS — M9902 Segmental and somatic dysfunction of thoracic region: Secondary | ICD-10-CM | POA: Diagnosis not present

## 2021-11-11 DIAGNOSIS — M9903 Segmental and somatic dysfunction of lumbar region: Secondary | ICD-10-CM | POA: Diagnosis not present

## 2021-11-11 DIAGNOSIS — M9901 Segmental and somatic dysfunction of cervical region: Secondary | ICD-10-CM | POA: Diagnosis not present

## 2021-11-11 DIAGNOSIS — M5387 Other specified dorsopathies, lumbosacral region: Secondary | ICD-10-CM | POA: Diagnosis not present

## 2021-11-11 DIAGNOSIS — M4603 Spinal enthesopathy, cervicothoracic region: Secondary | ICD-10-CM | POA: Diagnosis not present

## 2021-11-16 DIAGNOSIS — M9901 Segmental and somatic dysfunction of cervical region: Secondary | ICD-10-CM | POA: Diagnosis not present

## 2021-11-16 DIAGNOSIS — M4603 Spinal enthesopathy, cervicothoracic region: Secondary | ICD-10-CM | POA: Diagnosis not present

## 2021-11-16 DIAGNOSIS — M9903 Segmental and somatic dysfunction of lumbar region: Secondary | ICD-10-CM | POA: Diagnosis not present

## 2021-11-16 DIAGNOSIS — M5387 Other specified dorsopathies, lumbosacral region: Secondary | ICD-10-CM | POA: Diagnosis not present

## 2021-11-16 DIAGNOSIS — M9902 Segmental and somatic dysfunction of thoracic region: Secondary | ICD-10-CM | POA: Diagnosis not present

## 2021-11-16 DIAGNOSIS — M9904 Segmental and somatic dysfunction of sacral region: Secondary | ICD-10-CM | POA: Diagnosis not present

## 2021-11-18 DIAGNOSIS — M9903 Segmental and somatic dysfunction of lumbar region: Secondary | ICD-10-CM | POA: Diagnosis not present

## 2021-11-18 DIAGNOSIS — M4603 Spinal enthesopathy, cervicothoracic region: Secondary | ICD-10-CM | POA: Diagnosis not present

## 2021-11-18 DIAGNOSIS — M5387 Other specified dorsopathies, lumbosacral region: Secondary | ICD-10-CM | POA: Diagnosis not present

## 2021-11-18 DIAGNOSIS — M9902 Segmental and somatic dysfunction of thoracic region: Secondary | ICD-10-CM | POA: Diagnosis not present

## 2021-11-18 DIAGNOSIS — M9901 Segmental and somatic dysfunction of cervical region: Secondary | ICD-10-CM | POA: Diagnosis not present

## 2021-11-18 DIAGNOSIS — M9904 Segmental and somatic dysfunction of sacral region: Secondary | ICD-10-CM | POA: Diagnosis not present

## 2021-11-22 DIAGNOSIS — M5387 Other specified dorsopathies, lumbosacral region: Secondary | ICD-10-CM | POA: Diagnosis not present

## 2021-11-22 DIAGNOSIS — M9901 Segmental and somatic dysfunction of cervical region: Secondary | ICD-10-CM | POA: Diagnosis not present

## 2021-11-22 DIAGNOSIS — M9903 Segmental and somatic dysfunction of lumbar region: Secondary | ICD-10-CM | POA: Diagnosis not present

## 2021-11-22 DIAGNOSIS — M9902 Segmental and somatic dysfunction of thoracic region: Secondary | ICD-10-CM | POA: Diagnosis not present

## 2021-11-22 DIAGNOSIS — M9904 Segmental and somatic dysfunction of sacral region: Secondary | ICD-10-CM | POA: Diagnosis not present

## 2021-11-22 DIAGNOSIS — M4603 Spinal enthesopathy, cervicothoracic region: Secondary | ICD-10-CM | POA: Diagnosis not present

## 2021-11-24 DIAGNOSIS — M4603 Spinal enthesopathy, cervicothoracic region: Secondary | ICD-10-CM | POA: Diagnosis not present

## 2021-11-24 DIAGNOSIS — M9904 Segmental and somatic dysfunction of sacral region: Secondary | ICD-10-CM | POA: Diagnosis not present

## 2021-11-24 DIAGNOSIS — M9901 Segmental and somatic dysfunction of cervical region: Secondary | ICD-10-CM | POA: Diagnosis not present

## 2021-11-24 DIAGNOSIS — M9903 Segmental and somatic dysfunction of lumbar region: Secondary | ICD-10-CM | POA: Diagnosis not present

## 2021-11-24 DIAGNOSIS — M5387 Other specified dorsopathies, lumbosacral region: Secondary | ICD-10-CM | POA: Diagnosis not present

## 2021-11-24 DIAGNOSIS — M9902 Segmental and somatic dysfunction of thoracic region: Secondary | ICD-10-CM | POA: Diagnosis not present

## 2021-12-01 DIAGNOSIS — M5387 Other specified dorsopathies, lumbosacral region: Secondary | ICD-10-CM | POA: Diagnosis not present

## 2021-12-01 DIAGNOSIS — M9903 Segmental and somatic dysfunction of lumbar region: Secondary | ICD-10-CM | POA: Diagnosis not present

## 2021-12-01 DIAGNOSIS — M4603 Spinal enthesopathy, cervicothoracic region: Secondary | ICD-10-CM | POA: Diagnosis not present

## 2021-12-01 DIAGNOSIS — M9901 Segmental and somatic dysfunction of cervical region: Secondary | ICD-10-CM | POA: Diagnosis not present

## 2021-12-01 DIAGNOSIS — M9904 Segmental and somatic dysfunction of sacral region: Secondary | ICD-10-CM | POA: Diagnosis not present

## 2021-12-01 DIAGNOSIS — M9902 Segmental and somatic dysfunction of thoracic region: Secondary | ICD-10-CM | POA: Diagnosis not present

## 2021-12-15 DIAGNOSIS — M4603 Spinal enthesopathy, cervicothoracic region: Secondary | ICD-10-CM | POA: Diagnosis not present

## 2021-12-15 DIAGNOSIS — M9902 Segmental and somatic dysfunction of thoracic region: Secondary | ICD-10-CM | POA: Diagnosis not present

## 2021-12-15 DIAGNOSIS — M9903 Segmental and somatic dysfunction of lumbar region: Secondary | ICD-10-CM | POA: Diagnosis not present

## 2021-12-15 DIAGNOSIS — M9901 Segmental and somatic dysfunction of cervical region: Secondary | ICD-10-CM | POA: Diagnosis not present

## 2021-12-15 DIAGNOSIS — M5387 Other specified dorsopathies, lumbosacral region: Secondary | ICD-10-CM | POA: Diagnosis not present

## 2021-12-15 DIAGNOSIS — M9904 Segmental and somatic dysfunction of sacral region: Secondary | ICD-10-CM | POA: Diagnosis not present

## 2021-12-29 DIAGNOSIS — M9903 Segmental and somatic dysfunction of lumbar region: Secondary | ICD-10-CM | POA: Diagnosis not present

## 2021-12-29 DIAGNOSIS — M9904 Segmental and somatic dysfunction of sacral region: Secondary | ICD-10-CM | POA: Diagnosis not present

## 2021-12-29 DIAGNOSIS — M5387 Other specified dorsopathies, lumbosacral region: Secondary | ICD-10-CM | POA: Diagnosis not present

## 2021-12-29 DIAGNOSIS — M4603 Spinal enthesopathy, cervicothoracic region: Secondary | ICD-10-CM | POA: Diagnosis not present

## 2021-12-29 DIAGNOSIS — M9901 Segmental and somatic dysfunction of cervical region: Secondary | ICD-10-CM | POA: Diagnosis not present

## 2021-12-29 DIAGNOSIS — M9902 Segmental and somatic dysfunction of thoracic region: Secondary | ICD-10-CM | POA: Diagnosis not present

## 2021-12-30 DIAGNOSIS — M9903 Segmental and somatic dysfunction of lumbar region: Secondary | ICD-10-CM | POA: Diagnosis not present

## 2021-12-30 DIAGNOSIS — M4603 Spinal enthesopathy, cervicothoracic region: Secondary | ICD-10-CM | POA: Diagnosis not present

## 2021-12-30 DIAGNOSIS — M9902 Segmental and somatic dysfunction of thoracic region: Secondary | ICD-10-CM | POA: Diagnosis not present

## 2021-12-30 DIAGNOSIS — M9901 Segmental and somatic dysfunction of cervical region: Secondary | ICD-10-CM | POA: Diagnosis not present

## 2021-12-30 DIAGNOSIS — M9904 Segmental and somatic dysfunction of sacral region: Secondary | ICD-10-CM | POA: Diagnosis not present

## 2021-12-30 DIAGNOSIS — M5387 Other specified dorsopathies, lumbosacral region: Secondary | ICD-10-CM | POA: Diagnosis not present

## 2022-01-03 DIAGNOSIS — M5387 Other specified dorsopathies, lumbosacral region: Secondary | ICD-10-CM | POA: Diagnosis not present

## 2022-01-03 DIAGNOSIS — M9903 Segmental and somatic dysfunction of lumbar region: Secondary | ICD-10-CM | POA: Diagnosis not present

## 2022-01-03 DIAGNOSIS — M9902 Segmental and somatic dysfunction of thoracic region: Secondary | ICD-10-CM | POA: Diagnosis not present

## 2022-01-03 DIAGNOSIS — M9901 Segmental and somatic dysfunction of cervical region: Secondary | ICD-10-CM | POA: Diagnosis not present

## 2022-01-03 DIAGNOSIS — M4603 Spinal enthesopathy, cervicothoracic region: Secondary | ICD-10-CM | POA: Diagnosis not present

## 2022-01-03 DIAGNOSIS — M9904 Segmental and somatic dysfunction of sacral region: Secondary | ICD-10-CM | POA: Diagnosis not present

## 2022-01-06 DIAGNOSIS — M9903 Segmental and somatic dysfunction of lumbar region: Secondary | ICD-10-CM | POA: Diagnosis not present

## 2022-01-06 DIAGNOSIS — M9902 Segmental and somatic dysfunction of thoracic region: Secondary | ICD-10-CM | POA: Diagnosis not present

## 2022-01-06 DIAGNOSIS — M5387 Other specified dorsopathies, lumbosacral region: Secondary | ICD-10-CM | POA: Diagnosis not present

## 2022-01-06 DIAGNOSIS — M9904 Segmental and somatic dysfunction of sacral region: Secondary | ICD-10-CM | POA: Diagnosis not present

## 2022-01-06 DIAGNOSIS — M4603 Spinal enthesopathy, cervicothoracic region: Secondary | ICD-10-CM | POA: Diagnosis not present

## 2022-01-06 DIAGNOSIS — M9901 Segmental and somatic dysfunction of cervical region: Secondary | ICD-10-CM | POA: Diagnosis not present

## 2022-01-12 DIAGNOSIS — M9903 Segmental and somatic dysfunction of lumbar region: Secondary | ICD-10-CM | POA: Diagnosis not present

## 2022-01-12 DIAGNOSIS — M9904 Segmental and somatic dysfunction of sacral region: Secondary | ICD-10-CM | POA: Diagnosis not present

## 2022-01-12 DIAGNOSIS — M5387 Other specified dorsopathies, lumbosacral region: Secondary | ICD-10-CM | POA: Diagnosis not present

## 2022-01-12 DIAGNOSIS — M9902 Segmental and somatic dysfunction of thoracic region: Secondary | ICD-10-CM | POA: Diagnosis not present

## 2022-01-12 DIAGNOSIS — M9901 Segmental and somatic dysfunction of cervical region: Secondary | ICD-10-CM | POA: Diagnosis not present

## 2022-01-12 DIAGNOSIS — M4603 Spinal enthesopathy, cervicothoracic region: Secondary | ICD-10-CM | POA: Diagnosis not present

## 2022-01-19 DIAGNOSIS — M9904 Segmental and somatic dysfunction of sacral region: Secondary | ICD-10-CM | POA: Diagnosis not present

## 2022-01-19 DIAGNOSIS — M9903 Segmental and somatic dysfunction of lumbar region: Secondary | ICD-10-CM | POA: Diagnosis not present

## 2022-01-19 DIAGNOSIS — M9901 Segmental and somatic dysfunction of cervical region: Secondary | ICD-10-CM | POA: Diagnosis not present

## 2022-01-19 DIAGNOSIS — M4603 Spinal enthesopathy, cervicothoracic region: Secondary | ICD-10-CM | POA: Diagnosis not present

## 2022-01-19 DIAGNOSIS — M9902 Segmental and somatic dysfunction of thoracic region: Secondary | ICD-10-CM | POA: Diagnosis not present

## 2022-01-19 DIAGNOSIS — M5387 Other specified dorsopathies, lumbosacral region: Secondary | ICD-10-CM | POA: Diagnosis not present

## 2022-01-26 DIAGNOSIS — M9902 Segmental and somatic dysfunction of thoracic region: Secondary | ICD-10-CM | POA: Diagnosis not present

## 2022-01-26 DIAGNOSIS — M9903 Segmental and somatic dysfunction of lumbar region: Secondary | ICD-10-CM | POA: Diagnosis not present

## 2022-01-26 DIAGNOSIS — M9901 Segmental and somatic dysfunction of cervical region: Secondary | ICD-10-CM | POA: Diagnosis not present

## 2022-01-26 DIAGNOSIS — M9904 Segmental and somatic dysfunction of sacral region: Secondary | ICD-10-CM | POA: Diagnosis not present

## 2022-01-26 DIAGNOSIS — M4603 Spinal enthesopathy, cervicothoracic region: Secondary | ICD-10-CM | POA: Diagnosis not present

## 2022-01-26 DIAGNOSIS — M5387 Other specified dorsopathies, lumbosacral region: Secondary | ICD-10-CM | POA: Diagnosis not present

## 2022-02-09 DIAGNOSIS — M9901 Segmental and somatic dysfunction of cervical region: Secondary | ICD-10-CM | POA: Diagnosis not present

## 2022-02-09 DIAGNOSIS — M9904 Segmental and somatic dysfunction of sacral region: Secondary | ICD-10-CM | POA: Diagnosis not present

## 2022-02-09 DIAGNOSIS — M9903 Segmental and somatic dysfunction of lumbar region: Secondary | ICD-10-CM | POA: Diagnosis not present

## 2022-02-09 DIAGNOSIS — M9902 Segmental and somatic dysfunction of thoracic region: Secondary | ICD-10-CM | POA: Diagnosis not present

## 2022-02-09 DIAGNOSIS — M4603 Spinal enthesopathy, cervicothoracic region: Secondary | ICD-10-CM | POA: Diagnosis not present

## 2022-02-09 DIAGNOSIS — M5387 Other specified dorsopathies, lumbosacral region: Secondary | ICD-10-CM | POA: Diagnosis not present

## 2022-02-28 DIAGNOSIS — M9901 Segmental and somatic dysfunction of cervical region: Secondary | ICD-10-CM | POA: Diagnosis not present

## 2022-02-28 DIAGNOSIS — M9902 Segmental and somatic dysfunction of thoracic region: Secondary | ICD-10-CM | POA: Diagnosis not present

## 2022-02-28 DIAGNOSIS — M9904 Segmental and somatic dysfunction of sacral region: Secondary | ICD-10-CM | POA: Diagnosis not present

## 2022-02-28 DIAGNOSIS — M9903 Segmental and somatic dysfunction of lumbar region: Secondary | ICD-10-CM | POA: Diagnosis not present

## 2022-02-28 DIAGNOSIS — M5387 Other specified dorsopathies, lumbosacral region: Secondary | ICD-10-CM | POA: Diagnosis not present

## 2022-02-28 DIAGNOSIS — M4603 Spinal enthesopathy, cervicothoracic region: Secondary | ICD-10-CM | POA: Diagnosis not present

## 2022-03-02 DIAGNOSIS — M4603 Spinal enthesopathy, cervicothoracic region: Secondary | ICD-10-CM | POA: Diagnosis not present

## 2022-03-02 DIAGNOSIS — M9901 Segmental and somatic dysfunction of cervical region: Secondary | ICD-10-CM | POA: Diagnosis not present

## 2022-03-02 DIAGNOSIS — M9903 Segmental and somatic dysfunction of lumbar region: Secondary | ICD-10-CM | POA: Diagnosis not present

## 2022-03-02 DIAGNOSIS — M5387 Other specified dorsopathies, lumbosacral region: Secondary | ICD-10-CM | POA: Diagnosis not present

## 2022-03-02 DIAGNOSIS — M9904 Segmental and somatic dysfunction of sacral region: Secondary | ICD-10-CM | POA: Diagnosis not present

## 2022-03-02 DIAGNOSIS — M9902 Segmental and somatic dysfunction of thoracic region: Secondary | ICD-10-CM | POA: Diagnosis not present

## 2022-03-03 ENCOUNTER — Telehealth: Payer: Self-pay | Admitting: Obstetrics and Gynecology

## 2022-03-03 NOTE — Telephone Encounter (Signed)
Pt called stating htat she has dysasis, she has had 2 children, last borth was 12/24/20- she is asking about who she need to see for corrective surgery. She mentioned that Dr.Cherry had mentioned physical therapy - but pt states that it will not completely close gap- and will take a long time- she states that she is suffering from sever back pain. Please advise.  ?

## 2022-03-08 ENCOUNTER — Ambulatory Visit: Payer: Self-pay | Admitting: Physician Assistant

## 2022-03-08 ENCOUNTER — Encounter: Payer: Self-pay | Admitting: Physician Assistant

## 2022-03-08 ENCOUNTER — Other Ambulatory Visit: Payer: Self-pay

## 2022-03-08 DIAGNOSIS — J020 Streptococcal pharyngitis: Secondary | ICD-10-CM

## 2022-03-08 LAB — POCT RAPID STREP A (OFFICE): Rapid Strep A Screen: POSITIVE — AB

## 2022-03-08 LAB — POC COVID19 BINAXNOW: SARS Coronavirus 2 Ag: NEGATIVE

## 2022-03-08 MED ORDER — LIDOCAINE VISCOUS HCL 2 % MT SOLN: 5.0000 mL | Freq: Four times a day (QID) | OROMUCOSAL | 0 refills | Status: DC | PRN

## 2022-03-08 MED ORDER — AMOXICILLIN 875 MG PO TABS: 875.0000 mg | ORAL_TABLET | Freq: Two times a day (BID) | ORAL | 0 refills | Status: AC

## 2022-03-08 MED ORDER — PSEUDOEPH-BROMPHEN-DM 30-2-10 MG/5ML PO SYRP: 5.0000 mL | ORAL_SOLUTION | Freq: Four times a day (QID) | ORAL | 0 refills | Status: DC | PRN

## 2022-03-08 NOTE — Telephone Encounter (Signed)
Spoke with pt- she is scheduled for an apt 5/5 ?

## 2022-03-08 NOTE — Progress Notes (Signed)
Positive for strept, negative for rapid covid. ?

## 2022-03-08 NOTE — Progress Notes (Signed)
Pt presents today with sore throat,headache, fever 101,neck pain for 2 days. ?

## 2022-03-08 NOTE — Progress Notes (Signed)
? ?  Subjective: Sore throat  ? ? Patient ID: Diana Brooks, female    DOB: 08/25/1981, 41 y.o.   MRN: [id number] ? ?HPI ? ?Patient complaining of 1 day of sore throat.  Patient states pain with swallowing.  Patient also states mild cough and headache.  Patient test positive for strep pharyngitis and negative for COVID-19. ? ?Review of Systems ?Negative except for chief complaint ?   ?Objective:  ? Physical Exam ?This is a virtual visit.  Patient test positive for strep pharyngitis. ? ? ? ?   ?Assessment & Plan: Strep pharyngitis  ?Review of patient records show disease has an allergy to Suprax and sulfa antibiotics.  Patient states she has taken amoxicillin in the past with no side effects.  Patient given a prescription for amoxicillin, viscous lidocaine, and Bromfed-DM.  Patient to follow-up if no improvement or worsening complaint. ? ?

## 2022-03-16 DIAGNOSIS — M9902 Segmental and somatic dysfunction of thoracic region: Secondary | ICD-10-CM | POA: Diagnosis not present

## 2022-03-16 DIAGNOSIS — M9904 Segmental and somatic dysfunction of sacral region: Secondary | ICD-10-CM | POA: Diagnosis not present

## 2022-03-16 DIAGNOSIS — M9903 Segmental and somatic dysfunction of lumbar region: Secondary | ICD-10-CM | POA: Diagnosis not present

## 2022-03-16 DIAGNOSIS — M9901 Segmental and somatic dysfunction of cervical region: Secondary | ICD-10-CM | POA: Diagnosis not present

## 2022-03-16 DIAGNOSIS — M5387 Other specified dorsopathies, lumbosacral region: Secondary | ICD-10-CM | POA: Diagnosis not present

## 2022-03-16 DIAGNOSIS — M4603 Spinal enthesopathy, cervicothoracic region: Secondary | ICD-10-CM | POA: Diagnosis not present

## 2022-03-25 ENCOUNTER — Encounter: Payer: 59 | Admitting: Obstetrics and Gynecology

## 2022-04-01 DIAGNOSIS — D235 Other benign neoplasm of skin of trunk: Secondary | ICD-10-CM | POA: Diagnosis not present

## 2022-04-01 DIAGNOSIS — Z09 Encounter for follow-up examination after completed treatment for conditions other than malignant neoplasm: Secondary | ICD-10-CM | POA: Diagnosis not present

## 2022-04-01 DIAGNOSIS — Z872 Personal history of diseases of the skin and subcutaneous tissue: Secondary | ICD-10-CM | POA: Diagnosis not present

## 2022-04-01 DIAGNOSIS — Z08 Encounter for follow-up examination after completed treatment for malignant neoplasm: Secondary | ICD-10-CM | POA: Diagnosis not present

## 2022-04-01 DIAGNOSIS — Z85828 Personal history of other malignant neoplasm of skin: Secondary | ICD-10-CM | POA: Diagnosis not present

## 2022-04-04 DIAGNOSIS — M7051 Other bursitis of knee, right knee: Secondary | ICD-10-CM | POA: Diagnosis not present

## 2022-04-05 NOTE — Patient Instructions (Addendum)

## 2022-04-05 NOTE — Progress Notes (Signed)
GYNECOLOGY ANNUAL PHYSICAL EXAM PROGRESS NOTE  Subjective:    Diana Brooks is a 41 y.o. [id number] female who presents for an annual exam. The patient has no complaints today. The patient is sexually active. The patient participates in regular exercise: no. Has the patient ever been transfused or tattooed?: no. The patient reports that there is not domestic violence in her life.   The patient wishes to discuss the following today: Has questions about contraception.   Desires to discuss management for her diastasis recti  Menstrual History: Menarche age: 3 Patient's last menstrual period was 03/16/2022 (exact date). Period Cycle (Days): 26 Period Duration (Days): 7 days Period Pattern: Regular Menstrual Flow: Heavy Menstrual Control: Maxi pad, Tampon Menstrual Control Change Freq (Hours): 1-2 hours Dysmenorrhea: (!) Mild   Gynecologic History:  Contraception: none History of STI's: Denies Last Pap: 12/2017. Results were: normal.  Denies h/o abnormal pap smears.    Upstream - 04/08/22 0914       Pregnancy Intention Screening   Does the patient want to become pregnant in the next year? No    Does the patient's partner want to become pregnant in the next year? No    Would the patient like to discuss contraceptive options today? No      Contraception Wrap Up   Current Method No Method - Other Reason    End Method No Method - Other Reason    Contraception Counseling Provided Yes            The pregnancy intention screening data noted above was reviewed. Potential methods of contraception were discussed. The patient elected to proceed with No Method - Other Reason.  OB History  Gravida Para Term Preterm AB Living  '2 2 2 '$ 0 0 2  SAB IAB Ectopic Multiple Live Births  0 0 0 0 2    # Outcome Date GA Lbr Len/2nd Weight Sex Delivery Anes PTL Lv  2 Term 12/24/20 [redacted]w[redacted]d 9 lb 7.7 oz (4.3 kg) F CS-LTranv Spinal  LIV     Name: Giarratano,GIRL Afia     Apgar1: 8   Apgar5: 9  1 Term 2013   8 lb 1.6 oz (3.674 kg) M CS-LTranv   LIV    Past Medical History:  Diagnosis Date   Anemia    Anxiety    Asthma    exercise induced   Complication of anesthesia    Dysmenorrhea    Endometriosis    Family history of adverse reaction to anesthesia    N/V from mother   GDM (gestational diabetes mellitus)    [place]    IBS (irritable bowel syndrome)    Infertility, female    PONV (postoperative nausea and vomiting)    states phenergan helps alot post op n/v    Past Surgical History:  Procedure Laterality Date   CESAREAN SECTION  2013   CESAREAN SECTION N/A 12/24/2020   Procedure: CESAREAN SECTION;  Surgeon: CRubie Maid MD;  Location: [facility] ORS;  Service: Obstetrics;  Laterality: N/A;  REPEAT CESAREAN SECTION   CHROMOPERTUBATION N/A 01/11/2016   Procedure: CHROMOPERTUBATION OF FALLOPIAN TUBES;  Surgeon: MBrayton Mars MD;  Location: [facility];  Service: Gynecology;  Laterality: N/A;   CHROMOPERTUBATION  2012   GANGLION CYST EXCISION Left    wrist   LAPAROSCOPY     lap with excision of fulguration of endometriosis   LAPAROSCOPY N/A 01/11/2016   Procedure: LAPAROSCOPY DIAGNOSTIC/ WITH EXCISION AND FULGERATION OF ENDOMETRIOSIS; MYOMECTOMY;  Surgeon: Brayton Mars, MD;  Location: [facility] OR[facility];  Service: Gynecology;  Laterality: N/A;   skin cancer removal  03/10/2021   forehead    Family History  Problem Relation Age of Onset   Endometriosis Mother    Rheum arthritis Mother    Diabetes Paternal Grandfather    Prostate cancer Maternal Uncle    Healthy Father    Heart disease Neg Hx    Breast cancer Neg Hx    Ovarian cancer Neg Hx    Colon cancer Neg Hx     Social History   Socioeconomic History   Marital status: Married    Spouse name: Ryan   Number of children: 2   Years of education: Not on file   Highest education level: Not on file  Occupational History   Not on file  Tobacco Use   Smoking status: Never    Smokeless tobacco: Never  Vaping Use   Vaping Use: Never used  Substance and Sexual Activity   Alcohol use: Not Currently    Comment: rare   Drug use: No   Sexual activity: Not Currently    Birth control/protection: Other-see comments    Comment: undecided  Other Topics Concern   Not on file  Social History Narrative   Not on file   Social Determinants of Health   Financial Resource Strain: Not on file  Food Insecurity: Not on file  Transportation Needs: Not on file  Physical Activity: Not on file  Stress: Not on file  Social Connections: Not on file  Intimate Partner Violence: Not on file    Current Outpatient Medications on File Prior to Visit  Medication Sig Dispense Refill   albuterol (VENTOLIN HFA) 108 (90 Base) MCG/ACT inhaler Inhale 1-2 puffs into the lungs every 6 (six) hours as needed for wheezing or shortness of breath. 6.7 g 3   brompheniramine-pseudoephedrine-DM 30-2-10 MG/5ML syrup Take 5 mLs by mouth 4 (four) times daily as needed. Mix with 5 mL of viscous lidocaine for swish and swallow 120 mL 0   Cholecalciferol (VITAMIN D-3) 125 MCG (5000 UT) TABS Take 5,000 Units by mouth 3 (three) times a week.     ibuprofen (ADVIL) 800 MG tablet Take 800 mg by mouth 3 (three) times daily. As needed.     lidocaine (XYLOCAINE) 2 % solution Use as directed 5 mLs in the mouth or throat every 6 (six) hours as needed for mouth pain. Mix with 5 mL of Bromfed-DM for swish and swallow 100 mL 0   ONETOUCH VERIO test strip SMARTSIG:Via Meter 3-4 Times Daily     No current facility-administered medications on file prior to visit.    Allergies  Allergen Reactions   Levaquin [Levofloxacin In D5w] Other (See Comments)    Insomnia, fever, chills   Other Other (See Comments)    General Anesthesia-severe nausea/vomiting   Percocet [Oxycodone-Acetaminophen] Nausea And Vomiting   Vicodin [Hydrocodone-Acetaminophen] Nausea And Vomiting   Codeine Nausea And Vomiting   Sulfa Antibiotics  Rash and Other (See Comments)    Full body rash  Full body rash   Suprax [Cefixime] Rash     Review of Systems Constitutional: negative for chills, fatigue, fevers and sweats Eyes: negative for irritation, redness and visual disturbance Ears, nose, mouth, throat, and face: negative for hearing loss, nasal congestion, snoring and tinnitus Respiratory: negative for asthma, cough, sputum Cardiovascular: negative for chest pain, dyspnea, exertional chest pressure/discomfort, irregular heart beat, palpitations and syncope Gastrointestinal: negative for abdominal pain, change  in bowel habits, nausea and vomiting Genitourinary: negative for abnormal menstrual periods, genital lesions, sexual problems and vaginal discharge, dysuria and urinary incontinence Integument/breast: negative for breast lump, breast tenderness and nipple discharge Hematologic/lymphatic: negative for bleeding and easy bruising Musculoskeletal:negative for back pain and muscle weakness Neurological: negative for dizziness, headaches, vertigo and weakness Endocrine: negative for diabetic symptoms including polydipsia, polyuria and skin dryness Allergic/Immunologic: negative for hay fever and urticaria      Objective:   Blood pressure 130/81, pulse 78, height '5\' 4"'$  (1.626 m), weight 158 lb (71.7 kg), last menstrual period 03/16/2022, currently breastfeeding. Body mass index is 27.12 kg/m.    General Appearance:    Alert, cooperative, no distress, appears stated age, overweight  Head:    Normocephalic, without obvious abnormality, atraumatic  Eyes:    PERRL, conjunctiva/corneas clear, EOM's intact, both eyes  Ears:    Normal external ear canals, both ears  Nose:   Nares normal, septum midline, mucosa normal, no drainage or sinus tenderness  Throat:   Lips, mucosa, and tongue normal; teeth and gums normal  Neck:   Supple, symmetrical, trachea midline, no adenopathy; thyroid: no enlargement/tenderness/nodules; no carotid  bruit or JVD  Back:     Symmetric, no curvature, ROM normal, no CVA tenderness  Lungs:     Clear to auscultation bilaterally, respirations unlabored  Chest Wall:    No tenderness or deformity   Heart:    Regular rate and rhythm, S1 and S2 normal, no murmur, rub or gallop  Breast Exam:    No tenderness, masses, or nipple abnormality  Abdomen:     Soft, non-tender, bowel sounds active all four quadrants, no masses, no organomegaly.  Moderate diastasis on Valsalva.  Genitalia:    Pelvic:external genitalia normal, vagina without lesions, discharge, or tenderness, rectovaginal septum  normal. Cervix normal in appearance, no cervical motion tenderness, no adnexal masses or tenderness.  Uterus normal size, shape, mobile, regular contours, nontender.  Rectal:    Normal external sphincter.  No hemorrhoids appreciated. Internal exam not done.   Extremities:   Extremities normal, atraumatic, no cyanosis or edema  Pulses:   2+ and symmetric all extremities  Skin:   Skin color, texture, turgor normal, no rashes or lesions  Lymph nodes:   Cervical, supraclavicular, and axillary nodes normal  Neurologic:   CNII-XII intact, normal strength, sensation and reflexes throughout   .  Labs:  Lab Results  Component Value Date   WBC 4.7 10/05/2021   HGB 13.6 10/05/2021   HCT 40.5 10/05/2021   MCV 88 10/05/2021   PLT 255 10/05/2021    Lab Results  Component Value Date   CREATININE 0.76 10/05/2021   BUN 14 10/05/2021   NA 139 10/05/2021   K 4.2 10/05/2021   CL 104 10/05/2021   CO2 24 05/07/2021    Lab Results  Component Value Date   ALT 27 10/05/2021   AST 26 10/05/2021   GGT 22 10/05/2021   ALKPHOS 106 10/05/2021   BILITOT 0.3 10/05/2021    Lab Results  Component Value Date   TSH 1.820 10/05/2021    Lipid Panel     Component Value Date/Time   CHOL 207 (H) 10/05/2021 0835   TRIG 133 10/05/2021 0835   HDL 45 10/05/2021 0835   CHOLHDL 4.6 (H) 10/05/2021 0835   LDLCALC 138 (H) 10/05/2021  0835   LABVLDL 24 10/05/2021 0835    Assessment:   1. Encounter for well woman exam with routine gynecological exam  2. Diastasis of rectus abdominis   3. General counseling and advice for contraceptive management   4. BMI 25.0-25.9,adult   5. Hyperlipidemia, unspecified hyperlipidemia type      Plan:  - Blood tests: None ordered. Labs up to date - Breast self exam technique reviewed and patient encouraged to perform self-exam monthly. - Contraception: none.  Discussion had with patient regarding contraception options.  Notes that she would like something that did not a lot of hormonal influence. I discussed all nonhormonal forms of contraception, and also discussed low hormonal options such as low-dose pill or IUD.  Patient notes that she may be interested in either the copper IUD or low-dose.  Given information on both.  Also discussed permanent methods of contraception including vasectomy and tubal ligation.  Patient notes that her husband was concerned about vasectomy causing low libido as he already deals with testosterone issues.  I discussed with her that a vasectomy would not influence his hormonal status. - Discussed healthy lifestyle modifications. - Mammogram: Patient due to begin screenings at age 70. - Pap smear  performed today. . -Diastases recti, discussed that this is typically repaired surgically with plastic surgeon.  I also discussed the option of noninvasive approach with physical therapy.  Patient notes she would like to try this first.  Referral placed. -Hyperlipidemia, mild.  Would encourage lifestyle and dietary modifications. Follow up in 1 year for annual exam   Rubie Maid, MD [facility].

## 2022-04-08 ENCOUNTER — Ambulatory Visit (INDEPENDENT_AMBULATORY_CARE_PROVIDER_SITE_OTHER): Payer: 59 | Admitting: Obstetrics and Gynecology

## 2022-04-08 ENCOUNTER — Encounter: Payer: Self-pay | Admitting: Obstetrics and Gynecology

## 2022-04-08 VITALS — BP 130/81 | HR 78 | Ht 64.0 in | Wt 158.0 lb

## 2022-04-08 DIAGNOSIS — Z6825 Body mass index (BMI) 25.0-25.9, adult: Secondary | ICD-10-CM | POA: Diagnosis not present

## 2022-04-08 DIAGNOSIS — Z01419 Encounter for gynecological examination (general) (routine) without abnormal findings: Secondary | ICD-10-CM | POA: Diagnosis not present

## 2022-04-08 DIAGNOSIS — Z13220 Encounter for screening for lipoid disorders: Secondary | ICD-10-CM

## 2022-04-08 DIAGNOSIS — Z3009 Encounter for other general counseling and advice on contraception: Secondary | ICD-10-CM

## 2022-04-08 DIAGNOSIS — E785 Hyperlipidemia, unspecified: Secondary | ICD-10-CM

## 2022-04-08 DIAGNOSIS — Z131 Encounter for screening for diabetes mellitus: Secondary | ICD-10-CM

## 2022-04-08 DIAGNOSIS — M6208 Separation of muscle (nontraumatic), other site: Secondary | ICD-10-CM

## 2022-04-20 ENCOUNTER — Encounter: Payer: Self-pay | Admitting: Obstetrics and Gynecology

## 2022-04-27 ENCOUNTER — Encounter: Payer: Self-pay | Admitting: Obstetrics and Gynecology

## 2022-04-28 ENCOUNTER — Encounter: Payer: Self-pay | Admitting: Physician Assistant

## 2022-04-28 ENCOUNTER — Ambulatory Visit: Payer: Self-pay | Admitting: Physician Assistant

## 2022-04-28 DIAGNOSIS — F4323 Adjustment disorder with mixed anxiety and depressed mood: Secondary | ICD-10-CM

## 2022-04-28 MED ORDER — ESCITALOPRAM OXALATE 5 MG PO TABS: 5.0000 mg | ORAL_TABLET | Freq: Every day | ORAL | 0 refills | Status: DC

## 2022-04-28 NOTE — Progress Notes (Signed)
   Subjective: Anxiety and depression    Patient ID: JAMES LAFALCE, female    DOB: 1981/11/18, 41 y.o.   MRN: [id number]  HPI Patient complaining of anxiety and depression for greater than 3 months.  Patient states she was treated for anxiety and use Xanax on an as-needed basis.  Patient described her anxiety as a chronic condition secondary to work and home issues.  Patient states work is constant increased anxiety as she becomes depressed when she is at home because she complete her tasks.  Patient also has a 11-monthold child. Review of Systems     Objective:   Physical Exam  Vital signs not taken. Patient appears depressed.  Hesitancy in discussing her condition.      Assessment & Plan: Anxiety and depression.  Discussed with patient rationale for not taking as needed antianxiety medication due to the association with depression.  Patient voiced hesitancy about taking long-term medication.  Advised patient to have evaluation by EAP and then return for further treatment options.  A prescription for Lexapro will be generated and patient will make decision to start the medication after discussion with EAP.  Advised to follow-up in 2 weeks at the latest.

## 2022-04-28 NOTE — Progress Notes (Signed)
Pt requesting medication for depression/anxiety past several months have been over whelming and hard.

## 2022-04-29 ENCOUNTER — Other Ambulatory Visit (HOSPITAL_COMMUNITY)
Admission: RE | Admit: 2022-04-29 | Discharge: 2022-04-29 | Disposition: A | Discharge status: Home / Self Care | Payer: 59 | Source: Ambulatory Visit | Attending: Obstetrics and Gynecology | Admitting: Obstetrics and Gynecology

## 2022-04-29 ENCOUNTER — Other Ambulatory Visit: Payer: Self-pay | Admitting: Obstetrics and Gynecology

## 2022-04-29 ENCOUNTER — Ambulatory Visit (INDEPENDENT_AMBULATORY_CARE_PROVIDER_SITE_OTHER): Payer: 59 | Admitting: Obstetrics and Gynecology

## 2022-04-29 ENCOUNTER — Encounter: Payer: Self-pay | Admitting: Obstetrics and Gynecology

## 2022-04-29 VITALS — BP 128/80 | HR 72 | Resp 16 | Ht 64.0 in | Wt 158.0 lb

## 2022-04-29 DIAGNOSIS — Z124 Encounter for screening for malignant neoplasm of cervix: Secondary | ICD-10-CM

## 2022-04-29 DIAGNOSIS — Z01419 Encounter for gynecological examination (general) (routine) without abnormal findings: Secondary | ICD-10-CM | POA: Insufficient documentation

## 2022-04-29 DIAGNOSIS — N644 Mastodynia: Secondary | ICD-10-CM

## 2022-04-29 DIAGNOSIS — R69 Illness, unspecified: Secondary | ICD-10-CM | POA: Diagnosis not present

## 2022-04-29 DIAGNOSIS — F419 Anxiety disorder, unspecified: Secondary | ICD-10-CM | POA: Diagnosis not present

## 2022-04-29 MED ORDER — HYDROXYZINE HCL 50 MG PO TABS
50.0000 mg | ORAL_TABLET | Freq: Three times a day (TID) | ORAL | 2 refills | Status: DC | PRN
Start: 2022-04-29 — End: 2023-04-11

## 2022-04-29 NOTE — Progress Notes (Signed)
   Subjective:     Diana Brooks is a 41 y.o. [phone] woman who comes in today for a  pap smear only. Her most recent annual exam was on 04/08/2022. Her most recent Pap smear was performed on this day, however specimen sample was lost.    The following portions of the patient's history were reviewed and updated as appropriate: allergies, current medications, past family history, past medical history, past social history, past surgical history, and problem list.  Review of Systems A comprehensive review of systems was negative except for: Integument/breast: positive for breast tenderness on the right, feels like sharp twinges of pain. Behavioral/Psych: positive for anxiety   Objective:    LMP 03/16/2022 (Exact Date)  Pelvic Exam: cervix normal in appearance, external genitalia normal, and vagina normal without discharge. Pap smear obtained.   Assessment:   1. Cervical cancer screening   2. Lactating mother   3. Anxiety   4. Mastalgia      Plan:   - Follow up in 1 year for annual exam -  Discussed patient's anxiety, reports remote history of anxiety management with a as needed medication.  Patient currently feels that she needs something more baseline daily however wants something mild.  Discussed options of use of BuSpar, hydroxyzine, or SSRIs.  Patient notes that she would like to hold off on use of SSRIs if possible.  Will prescribe hydroxyzine. -Breast pain, discussed that as patient is still currently breast-feeding, it is possible to experience these well twinges.  She does note that her left breast does produce more than her right.  Also advised on decreasing caffeine intake, increasing magnesium intake use warm compresses to the breast as needed.   Rubie Maid, MD [facility]

## 2022-04-29 NOTE — Patient Instructions (Signed)

## 2022-05-03 LAB — CYTOLOGY - PAP
Comment: NEGATIVE
Diagnosis: NEGATIVE
High risk HPV: NEGATIVE

## 2022-05-03 NOTE — Telephone Encounter (Signed)
Patient came in for a repeat pap smear and she was billed. How can we take this off.

## 2022-05-05 DIAGNOSIS — L814 Other melanin hyperpigmentation: Secondary | ICD-10-CM | POA: Diagnosis not present

## 2022-05-05 DIAGNOSIS — D485 Neoplasm of uncertain behavior of skin: Secondary | ICD-10-CM | POA: Diagnosis not present

## 2022-05-05 DIAGNOSIS — L538 Other specified erythematous conditions: Secondary | ICD-10-CM | POA: Diagnosis not present

## 2022-05-05 DIAGNOSIS — D225 Melanocytic nevi of trunk: Secondary | ICD-10-CM | POA: Diagnosis not present

## 2022-05-05 DIAGNOSIS — C44612 Basal cell carcinoma of skin of right upper limb, including shoulder: Secondary | ICD-10-CM | POA: Diagnosis not present

## 2022-05-12 ENCOUNTER — Ambulatory Visit: Payer: 59 | Attending: Obstetrics and Gynecology

## 2022-05-12 DIAGNOSIS — M6281 Muscle weakness (generalized): Secondary | ICD-10-CM | POA: Diagnosis not present

## 2022-05-12 DIAGNOSIS — R278 Other lack of coordination: Secondary | ICD-10-CM

## 2022-05-12 DIAGNOSIS — M6208 Separation of muscle (nontraumatic), other site: Secondary | ICD-10-CM | POA: Diagnosis not present

## 2022-05-12 DIAGNOSIS — M5459 Other low back pain: Secondary | ICD-10-CM

## 2022-05-12 NOTE — Therapy (Signed)
OUTPATIENT PHYSICAL THERAPY FEMALE PELVIC EVALUATION   Patient Name: Diana Brooks MRN: [id number] DOB:[birth year], 41 y.o., female Today's Date: 05/12/2022   PT End of Session - 05/12/22 0756     Visit Number 1    Number of Visits 12    Date for PT Re-Evaluation 08/04/22    Authorization Type IE: 05/12/22    PT Start Time 0800    PT Stop Time 0849    PT Time Calculation (min) 49 min    Activity Tolerance Patient tolerated treatment well             Past Medical History:  Diagnosis Date   Anemia    Anxiety    Asthma    exercise induced   Complication of anesthesia    Dysmenorrhea    Endometriosis    Family history of adverse reaction to anesthesia    N/V from mother   GDM (gestational diabetes mellitus)    Hyperprolactinemia ([place])    IBS (irritable bowel syndrome)    Infertility, female    PONV (postoperative nausea and vomiting)    states phenergan helps alot post op n/v   Past Surgical History:  Procedure Laterality Date   CESAREAN SECTION  2013   CESAREAN SECTION N/A 12/24/2020   Procedure: CESAREAN SECTION;  Surgeon: Rubie Maid, MD;  Location: [facility];  Service: Obstetrics;  Laterality: N/A;  REPEAT CESAREAN SECTION   CHROMOPERTUBATION N/A 01/11/2016   Procedure: CHROMOPERTUBATION OF FALLOPIAN TUBES;  Surgeon: Brayton Mars, MD;  Location: [facility];  Service: Gynecology;  Laterality: N/A;   CHROMOPERTUBATION  2012   GANGLION CYST EXCISION Left    wrist   LAPAROSCOPY     lap with excision of fulguration of endometriosis   LAPAROSCOPY N/A 01/11/2016   Procedure: LAPAROSCOPY DIAGNOSTIC/ WITH EXCISION AND FULGERATION OF ENDOMETRIOSIS; MYOMECTOMY;  Surgeon: Brayton Mars, MD;  Location: [facility];  Service: Gynecology;  Laterality: N/A;   skin cancer removal  03/10/2021   forehead   Patient Active Problem List   Diagnosis Date Noted   Hx of migraines 06/27/2020   Mild exercise-induced asthma 01/23/2020   BMI 25.0-25.9,adult 01/23/2020    Hyperlipidemia LDL goal <100 01/23/2020   History of gestational diabetes 01/23/2020   History of squamous cell carcinoma excision 01/23/2020   History of dysplastic nevus 01/23/2020   Ganglion of wrist 01/22/2020   Endometriosis 10/22/2015   Infertility, female, secondary 10/22/2015   Cyst of nipple 10/22/2015    PCP: Randel Pigg, MD (thru employer)  REFERRING PROVIDER: Rubie Maid, MD  REFERRING DIAG: M62.08 (ICD-10-CM) - Diastasis of rectus abdominis  THERAPY DIAG:  Muscle weakness (generalized)  Other lack of coordination  Other low back pain  Rationale for Evaluation and Treatment: Rehabilitation  ONSET DATE:   RED FLAGS: N/A  Have you had any night sweats? Unexplained weight loss? Saddle anesthesia? Unexplained changes in bowel or bladder habits?   SUBJECTIVE: Patient confirms identification and approves PT to assess pelvic floor and treatment Yes  PRECAUTIONS: None  WEIGHT BEARING RESTRICTIONS: No  FALLS:  Has patient fallen in last 6 months? 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 Pt has pretty regular low back pain but worsened after having children. The back pain did not necessarily get worse with the second child but it can be constant at times. Children are 70 y.o and 32 months old. Pt does have a hx of doing crunches before the 1st child but has learned from information online to avoid motions such as trunk flexion. Pt is not breastfeeding anymore. Pt is also seeing a chiropractor for low back pain and some days has  no pain at all. She has tried doing some exercises she found online, supine with toe taps, and did feel sore the next day as if she did work her abdominals.    PAIN:  Are you having pain? Yes NPRS scale: 6/10 (worst), 0/10 (best)  Pain location: low back pain  Pain type: aching and dull Pain description: dull and aching     LIVING ENVIRONMENT: Lives with: lives with their family and lives with their spouse Lives in: House/apartment   PATIENT GOALS: I want to get rid of the diastasis and be able to feel comfortable wearing clothes and in my body.     UROLOGICAL HISTORY  Leakage: Sneezing, very rarely but does happen and leakage is very little    GASTROINTESTINAL HISTORY Pt has no concerns    SEXUAL HISTORY/FUNCTION Pt has no concerns    OBSTETRICAL HISTORY Vaginal deliveries: G2P2 C-section deliveries: both children  Currently pregnant: No  GYNECOLOGICAL HISTORY Pelvic Organ Prolapse: None Heaviness/pressure: no    OBJECTIVE:    COGNITION: Overall cognitive status: Within functional limits for tasks assessed     POSTURE:  Grossly WNL, in sitting crossing of legs and occasional B plantarflexion   Iliac crest height: equal Pelvic obliquity: equal    LUMBAR AROM/PROM:  AROM (Normal range in degrees) AROM  05/12/22  Flexion (65) WNL  Extension (25-30) WNL *  Right lateral flexion (25) WNL  Left lateral flexion (25) WNL  Right rotation (30) WNL  Left rotation (30) WNL   (*= pain, Blank rows = not tested)  LOWER EXTREMITY ROM: Deferred 2/2 time constraints    ROM (Normal range in degrees) Right  Left   Hip flexion (0-125)    Hip extension (0-15)    Hip abduction (0-40)    Hip adduction    Hip internal rotation (0-45)    Hip external rotation (0-45)    Knee flexion    Knee extension    Ankle dorsiflexion    Ankle plantarflexion    Ankle inversion    Ankle eversion     (*= pain, Blank rows = not tested)  LOWER EXTREMITY MMT:  MMT  Right 05/12/22 Left 05/12/22  Hip flexion 5 5  Hip extension    Hip abduction    Hip adduction    Hip internal rotation 5 5  Hip external rotation 5 5  Knee flexion 5 5  Knee extension 5 5  Ankle dorsiflexion (in sitting) 5 5  Ankle plantarflexion    Ankle inversion    Ankle eversion    (*= pain, Blank rows = not tested)  PHYSICAL PERFORMANCE MEASURES:  RLE SLS: PSIS mobility WNL  LLE SLS: PSIS mobility WNL    PALPATION: Abdominal:  Diastasis: 3 finger above umbilicus with depth to DIP, 2.5 fingers at umbilicus with depth to DIP and 2 fingers below umbilicus, observed breath holding and doming  Scar: present from c-sections, Pt is not concerned or having pain but will assess in future sessions   EXTERNAL PELVIC EXAM: Patient educated on the purpose of the pelvic exam and articulated understanding; patient consented to the exam verbally. Deferred at this time.  Palpation: Breath coordination: present/absent/inconsistent Voluntary Contraction: present/absent Relaxation: full/delayed/non-relaxing Perineal movement with sustained IAP increase ("bear down"): descent/no change/elevation/excessive descent Perineal movement with rapid IAP increase ("cough"): elevation/no change/descent Pubic symphysis: (0= no contraction, 1= flicker, 2= weak squeeze, 3= fair squeeze with lift, 4= good squeeze and lift against resistance, 5= strong squeeze against strong resistance)     TODAY'S TREATMENT:   Neuromuscular Re-education: Supine hooklying diaphragmatic breathing for improved IAP management and pain modulation technique for low back   Supine TrA contraction coordinated with exhale and DRAM compression  VCs and tactile feedback required for proper technique   Discussion and demonstration of log rolling technique to decrease worsening of diastasis recti as well as pain modulation technique for low back   Patient Education:  Patient educated on what to expect during course of  physical therapy, POC, and provided with HEP including: [id number] and informational handout on scar massage for c-section. Patient verbalized understanding and returned demonstration. Patient will benefit from further education in order to maximize compliance and understanding for long-term therapeutic gains.   Patient Surveys:  Will assess next visit     ASSESSMENT:  Clinical Impression: Patient is a 41 y.o. who was seen today for physical therapy evaluation and treatment for a chief complaint of diastasis recti. Upon physical assessment patient presents with deficits in IAP management, posture, and pain as evidenced by worst pain 6/10 (NPRS scale) of the low back, increased breath holding during abdominal examination, increased pressure in the low back and abdomen during sit<>supine transfers, and presence of diastasis (3 fingers above umbilicus with DIP depth, 2.5 at umbilicus with DIP depth, and 2 fingers below umbilicus). Today's evaluation also suggests deficits in scar mobility from a hx of 2 cesarean births.  Pt with basic understanding of the deep core musculature and its function in IAP management, breathing coordination, and log rolling technique to reduce pressure on the body. Patient's progress may be limited due to time since onset; however, patient's motivation is advantageous. Patient will benefit from skilled therapeutic intervention to address deficits in IAP management, posture, scar mobility, and pain in order to increase PLOF and improve overall QOL.    Objective Impairments: decreased coordination, decreased endurance, decreased mobility, decreased strength, improper body mechanics, and pain.   Activity Limitations: carrying, lifting, bending, squatting, transfers, and caring for others  Personal Factors: Behavior pattern, Time since onset of injury/illness/exacerbation, and 1 comorbidity: HTN  are also affecting patient's functional outcome.   Rehab Potential:  Good  Clinical Decision Making: Evolving/moderate complexity  Evaluation Complexity: Moderate   GOALS: Goals reviewed with patient? Yes  SHORT TERM GOALS: Target date: 06/23/2022  Patient will improve score on FOTO Low Back Pain by at least 5 points in order to demonstrate decreased pain, improved IAP management improved and improved overall QOL.  Baseline: will assess next visit Goal status: INITIAL    LONG TERM GOALS: Target date: 08/04/2022    Pt will be able  to coordinate activation of deep core musculature with diaphragmatic breathing while performing activities such as, but not limited to: lifting, bending, squatting, caring for young children, and physical activity in order to reduce worst pain by at least 2 points and participate in activities in the home and in the community.  Baseline: 6/10 and demonstrates increased breath holding Goal status: INITIAL  2.  Patient will be independent in the performance of a HEP in order to participate fully, without limitation or disruptions, in activities at home and in the community.  Baseline: TrA activation with DRAM compression Goal status: INITIAL  3.  Patient will be able to demonstrate improved abdominal strengthening with closure of the diastasis recti to 2 fingers or less in order to return to caring for young children and physical activity without limitation or distress.  Baseline: 3 fingers above/DIP depth, 2.5 at/DIP depth, 2 below  Goal status: INITIAL    PLAN: PT Frequency: 1x/week  PT Duration: 12 weeks  Planned Interventions: Therapeutic exercises, Therapeutic activity, Neuromuscular re-education, Balance training, Gait training, Patient/Family education, Joint mobilization, Spinal mobilization, Moist heat, scar mobilization, Taping, and Manual therapy  Plan For Next Session: FOTO, continue deep core, possible scar techniques    Krystle Polcyn, PT, DPT  05/12/2022, 9:39 AM

## 2022-05-19 ENCOUNTER — Ambulatory Visit: Payer: 59

## 2022-05-19 DIAGNOSIS — R278 Other lack of coordination: Secondary | ICD-10-CM

## 2022-05-19 DIAGNOSIS — C44612 Basal cell carcinoma of skin of right upper limb, including shoulder: Secondary | ICD-10-CM | POA: Diagnosis not present

## 2022-05-19 DIAGNOSIS — M6281 Muscle weakness (generalized): Secondary | ICD-10-CM

## 2022-05-19 DIAGNOSIS — M5459 Other low back pain: Secondary | ICD-10-CM | POA: Diagnosis not present

## 2022-05-19 DIAGNOSIS — M6208 Separation of muscle (nontraumatic), other site: Secondary | ICD-10-CM | POA: Diagnosis not present

## 2022-05-19 NOTE — Therapy (Signed)
OUTPATIENT PHYSICAL THERAPY FEMALE PELVIC TREATMENT   Patient Name: Diana Brooks MRN: [id number] DOB:Apr 03, 1981, 41 y.o., female Today's Date: 05/19/2022   PT End of Session - 05/19/22 0802     Visit Number 2    Number of Visits 12    Date for PT Re-Evaluation 08/04/22    PT Start Time 0801    PT Stop Time 0845    PT Time Calculation (min) 44 min    Activity Tolerance Patient tolerated treatment well             Past Medical History:  Diagnosis Date   Anemia    Anxiety    Asthma    exercise induced   Complication of anesthesia    Dysmenorrhea    Endometriosis    Family history of adverse reaction to anesthesia    N/V from mother   GDM (gestational diabetes mellitus)    Hyperprolactinemia ([place])    IBS (irritable bowel syndrome)    Infertility, female    PONV (postoperative nausea and vomiting)    states phenergan helps alot post op n/v   Past Surgical History:  Procedure Laterality Date   CESAREAN SECTION  2013   CESAREAN SECTION N/A 12/24/2020   Procedure: CESAREAN SECTION;  Surgeon: Rubie Maid, MD;  Location: [facility];  Service: Obstetrics;  Laterality: N/A;  REPEAT CESAREAN SECTION   CHROMOPERTUBATION N/A 01/11/2016   Procedure: CHROMOPERTUBATION OF FALLOPIAN TUBES;  Surgeon: Brayton Mars, MD;  Location: [facility];  Service: Gynecology;  Laterality: N/A;   CHROMOPERTUBATION  2012   GANGLION CYST EXCISION Left    wrist   LAPAROSCOPY     lap with excision of fulguration of endometriosis   LAPAROSCOPY N/A 01/11/2016   Procedure: LAPAROSCOPY DIAGNOSTIC/ WITH EXCISION AND FULGERATION OF ENDOMETRIOSIS; MYOMECTOMY;  Surgeon: Brayton Mars, MD;  Location: [facility];  Service: Gynecology;  Laterality: N/A;   skin cancer removal  03/10/2021   forehead   Patient Active Problem List   Diagnosis Date Noted   Hx of migraines 06/27/2020   Mild exercise-induced asthma 01/23/2020   BMI 25.0-25.9,adult 01/23/2020   Hyperlipidemia LDL goal <100  01/23/2020   History of gestational diabetes 01/23/2020   History of squamous cell carcinoma excision 01/23/2020   History of dysplastic nevus 01/23/2020   Ganglion of wrist 01/22/2020   Endometriosis 10/22/2015   Infertility, female, secondary 10/22/2015   Cyst of nipple 10/22/2015    PCP: Randel Pigg, MD (thru employer)  REFERRING PROVIDER: Rubie Maid, MD  REFERRING DIAG: M62.08 (ICD-10-CM) - Diastasis of rectus abdominis  THERAPY DIAG:  Muscle weakness (generalized)  Other lack of coordination  Other low back pain  Rationale for Evaluation and Treatment: Rehabilitation  ONSET DATE: A few years  PRECAUTIONS: None  WEIGHT BEARING RESTRICTIONS: No  FALLS:  Has patient fallen in last 6 months? 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 Pt has pretty regular low back pain but worsened after having children. The back pain did not necessarily get worse with the second child but it can be constant at times. Children are 69 y.o and 26 months old. Pt does have a hx of doing crunches before the 1st child but has learned from information online to avoid motions such as trunk flexion. Pt is not breastfeeding anymore. Pt is also seeing a chiropractor for low back pain and some days has no pain at all. She has tried doing some exercises she found online, supine with toe taps, and did feel sore the next day as if she did work her abdominals.     LIVING ENVIRONMENT: Lives with: lives with their family and lives with their spouse Lives in: House/apartment   PATIENT GOALS: I  want to get rid of the diastasis and be able to feel comfortable wearing clothes and in my body.     SUBJECTIVE:  Pt reports trying to activate the TrA muscle but feels like the diastasis is getting worse. Pt also reports feeling a little sore in the lower abdomen the following day after first trying the HEP, but felt better after rest and the following days after more practice. Also, this past the week the Pt noticed some leakage with sneezing and did not have a pad so had to change underwear. Pt is concerned that she is going in the opposite direction.    PAIN:  Are you having pain? Yes NPRS scale: 1/10  Pain location: low back pain  Pain type: aching and dull Pain description: dull and aching    OBJECTIVE:    COGNITION: (05/12/22) Overall cognitive status: Within functional limits for tasks assessed     POSTURE: (05/12/22) Grossly WNL, in sitting crossing of legs and occasional B plantarflexion   Iliac crest height: equal Pelvic obliquity: equal    LUMBAR AROM/PROM:  AROM (Normal range in degrees) AROM  05/12/22  Flexion (65) WNL  Extension (25-30) WNL *  Right lateral flexion (25) WNL  Left lateral flexion (25) WNL  Right rotation (30) WNL  Left rotation (30) WNL   (*= pain, Blank rows = not tested)  LOWER EXTREMITY ROM:    ROM (Normal range in degrees) Right 05/19/22 Left 05/19/22  Hip flexion (0-125) WNL WNL  Hip extension (0-15)    Hip abduction (0-40) WNL WNL  Hip adduction    Hip internal rotation (0-45) WNL* WNL*  Hip external rotation (0-45) WNL WNL  Knee flexion WNL WNL  Knee extension    Ankle dorsiflexion    Ankle plantarflexion    Ankle inversion    Ankle eversion     (*= pain, Blank rows = not tested)  LOWER EXTREMITY MMT:  MMT Right 05/12/22 Left 05/12/22  Hip flexion 5 5  Hip extension 5 5  Hip abduction    Hip adduction    Hip internal rotation 5 5  Hip external rotation 5 5  Knee flexion 5 5  Knee extension 5 5  Ankle dorsiflexion  (in sitting) 5 5  Ankle plantarflexion  Ankle inversion    Ankle eversion    (*= pain, Blank rows = not tested)   PHYSICAL PERFORMANCE MEASURES:  RLE SLS: PSIS mobility WNL  LLE SLS: PSIS mobility WNL    PALPATION: Abdominal:  Diastasis: 3 finger above umbilicus with depth to DIP, 2.5 fingers at umbilicus with depth to DIP and 2 fingers below umbilicus, observed breath holding and doming  Scar: present from c-sections, Pt is not concerned or having pain but will assess in future sessions   EXTERNAL PELVIC EXAM: Patient educated on the purpose of the pelvic exam and articulated understanding; patient consented to the exam verbally. (05/19/22) Breath coordination: present but inconsistent Voluntary Contraction: 2/5 MMT Relaxation: full Perineal movement with sustained IAP increase ("bear down"): no change, gluteal activation Perineal movement with rapid IAP increase ("cough"): no change (0= no contraction, 1= flicker, 2= weak squeeze, 3= fair squeeze with lift, 4= good squeeze and lift against resistance, 5= strong squeeze against strong resistance)     TODAY'S TREATMENT:  Pre-treatment assessment included: LE ROM, external PFM exam   Neuromuscular Re-education: Supine hooklying diaphragmatic breathing for improved IAP management and pain modulation technique for low back   Review of supine TrA contraction coordinated with exhale and DRAM compression  VCs and tactile feedback required for proper technique   Supine hooklying TrA activation with UE challenge (serratus punch) with coordinated exhale, VCs and TCs as needed   Discussion on going over proper lifting techniques and how deep core stability and breath coordination is important to avoid further complications at the pelvic floor.    Patient Education:  Added intervention to HEP: supine TrA activation with UE challenge. Patient educated throughout session on appropriate technique and form using multi-modal cueing, HEP,  and activity modification. Patient continue to benefit from further education in order to maximize compliance and understanding for long-term therapeutic gains.    Patient Surveys:  (05/19/22): FOTO Lumbar Spine - 69    ASSESSMENT:  Clinical Impression: Patient presents to clinic with excellent motivation to participate in today's session. Upon further physical assessment, Pt demonstrates deficits in IAP management, PFM coordination, PFM strength, scar mobility, pain and posture as evidenced by presence of diastasis (3 fingers above umbilicus with DIP depth, 2.5 at umbilicus with DIP depth), increased pain in low back with PROM of B hip IR, constant LBP currently 1/10 in sitting, present but inconsistent breath coordination, 2/5 MMT PFM with gluteal activation, no change in sustained IAP increase (bearing down) with presence of gluteal activity. Pt required moderate cueing for TrA activation with UE challenge to avoid bodily compensations and achieve proper coordination. In addition, discussion on quality vs. quantity approach during active interventions/HEP. Pt responded well to all active and educational interventions. Patient will continue to benefit from skilled therapeutic intervention to address deficits in IAP management, PFM coordination, PFM strength, posture, scar mobility, and pain in order to increase PLOF and improve overall QOL.    Objective Impairments: decreased coordination, decreased endurance, decreased mobility, decreased strength, improper body mechanics, and pain.   Activity Limitations: carrying, lifting, bending, squatting, transfers, and caring for others  Personal Factors: Behavior pattern, Time since onset of injury/illness/exacerbation, and 1 comorbidity: HTN  are also affecting patient's functional outcome.   Rehab Potential: Good  Clinical Decision Making: Evolving/moderate complexity  Evaluation Complexity: Moderate   GOALS: Goals reviewed with patient?  Yes  SHORT TERM GOALS: Target date: 06/30/2022  Patient will improve score on FOTO Low Back Pain by at least 5 points in  order to demonstrate decreased pain, improved IAP management improved and improved overall QOL.  Baseline: (05/19/22)- 69 (target is 74) Goal status: INITIAL    LONG TERM GOALS: Target date: 08/11/2022    Pt will be able to coordinate activation of deep core musculature with diaphragmatic breathing while performing activities such as, but not limited to: lifting, bending, squatting, caring for young children, and physical activity in order to reduce worst pain by at least 2 points and participate in activities in the home and in the community.  Baseline: 6/10 and demonstrates increased breath holding Goal status: INITIAL  2.  Patient will be independent in the performance of a HEP in order to participate fully, without limitation or disruptions, in activities at home and in the community.  Baseline: TrA activation with DRAM compression Goal status: INITIAL  3.  Patient will be able to demonstrate improved abdominal strengthening with closure of the diastasis recti to 2 fingers or less in order to return to caring for young children and physical activity without limitation or distress.  Baseline: 3 fingers above/DIP depth, 2.5 at/DIP depth, 2 below  Goal status: INITIAL    PLAN: PT Frequency: 1x/week  PT Duration: 12 weeks  Planned Interventions: Therapeutic exercises, Therapeutic activity, Neuromuscular re-education, Balance training, Gait training, Patient/Family education, Joint mobilization, Spinal mobilization, Moist heat, scar mobilization, Taping, and Manual therapy  Plan For Next Session: progress deep core, manual technique for DRAM, c-section?   Finn Altemose, PT, DPT  05/19/2022, 11:16 AM

## 2022-05-25 ENCOUNTER — Ambulatory Visit: Payer: 59 | Attending: Obstetrics and Gynecology

## 2022-05-25 DIAGNOSIS — M5459 Other low back pain: Secondary | ICD-10-CM | POA: Insufficient documentation

## 2022-05-25 DIAGNOSIS — R278 Other lack of coordination: Secondary | ICD-10-CM | POA: Diagnosis not present

## 2022-05-25 DIAGNOSIS — M6281 Muscle weakness (generalized): Secondary | ICD-10-CM | POA: Diagnosis not present

## 2022-05-25 NOTE — Therapy (Signed)
OUTPATIENT PHYSICAL THERAPY FEMALE PELVIC TREATMENT   Patient Name: Diana Brooks MRN: [id number] DOB:1981-01-15, 41 y.o., female Today's Date: 05/25/2022   PT End of Session - 05/25/22 1103     Visit Number 3    Number of Visits 12    Date for PT Re-Evaluation 08/04/22    PT Start Time 67    PT Stop Time 1143    PT Time Calculation (min) 41 min    Activity Tolerance Patient tolerated treatment well             Past Medical History:  Diagnosis Date   Anemia    Anxiety    Asthma    exercise induced   Complication of anesthesia    Dysmenorrhea    Endometriosis    Family history of adverse reaction to anesthesia    N/V from mother   GDM (gestational diabetes mellitus)    Hyperprolactinemia ([place])    IBS (irritable bowel syndrome)    Infertility, female    PONV (postoperative nausea and vomiting)    states phenergan helps alot post op n/v   Past Surgical History:  Procedure Laterality Date   CESAREAN SECTION  2013   CESAREAN SECTION N/A 12/24/2020   Procedure: CESAREAN SECTION;  Surgeon: Rubie Maid, MD;  Location: [facility];  Service: Obstetrics;  Laterality: N/A;  REPEAT CESAREAN SECTION   CHROMOPERTUBATION N/A 01/11/2016   Procedure: CHROMOPERTUBATION OF FALLOPIAN TUBES;  Surgeon: Brayton Mars, MD;  Location: [facility];  Service: Gynecology;  Laterality: N/A;   CHROMOPERTUBATION  2012   GANGLION CYST EXCISION Left    wrist   LAPAROSCOPY     lap with excision of fulguration of endometriosis   LAPAROSCOPY N/A 01/11/2016   Procedure: LAPAROSCOPY DIAGNOSTIC/ WITH EXCISION AND FULGERATION OF ENDOMETRIOSIS; MYOMECTOMY;  Surgeon: Brayton Mars, MD;  Location: [facility];  Service: Gynecology;  Laterality: N/A;   skin cancer removal  03/10/2021   forehead   Patient Active Problem List   Diagnosis Date Noted   Hx of migraines 06/27/2020   Mild exercise-induced asthma 01/23/2020   BMI 25.0-25.9,adult 01/23/2020   Hyperlipidemia LDL goal <100  01/23/2020   History of gestational diabetes 01/23/2020   History of squamous cell carcinoma excision 01/23/2020   History of dysplastic nevus 01/23/2020   Ganglion of wrist 01/22/2020   Endometriosis 10/22/2015   Infertility, female, secondary 10/22/2015   Cyst of nipple 10/22/2015    PCP: Randel Pigg, MD (thru employer)  REFERRING PROVIDER: Rubie Maid, MD  REFERRING DIAG: M62.08 (ICD-10-CM) - Diastasis of rectus abdominis  THERAPY DIAG:  Muscle weakness (generalized)  Other lack of coordination  Other low back pain  Rationale for Evaluation and Treatment: Rehabilitation  ONSET DATE: A few years  PRECAUTIONS: None  WEIGHT BEARING RESTRICTIONS: No  FALLS:  Has patient fallen in last 6 months? 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 Pt has pretty regular low back pain but worsened after having children. The back pain did not necessarily get worse with the second child but it can be constant at times. Children are 44 y.o and 13 months old. Pt does have a hx of doing crunches before the 1st child but has learned from information online to avoid motions such as trunk flexion. Pt is not breastfeeding anymore. Pt is also seeing a chiropractor for low back pain and some days has no pain at all. She has tried doing some exercises she found online, supine with toe taps, and did feel sore the next day as if she did work her abdominals.     LIVING ENVIRONMENT: Lives with: lives with their family and lives with their spouse Lives in: House/apartment   PATIENT GOALS: I  want to get rid of the diastasis and be able to feel comfortable wearing clothes and in my body.     SUBJECTIVE:  Pt reports being able to feel the TrA muscle working better this week and still feels the soreness slightly. Pt is unsure if the soreness means she is doing the exercise correctly.   PAIN:  Are you having pain? Yes NPRS scale: 1/10  Pain location: low back pain  Pain type: aching and dull Pain description: dull and aching    OBJECTIVE:    COGNITION: (05/12/22) Overall cognitive status: Within functional limits for tasks assessed     POSTURE: (05/12/22) Grossly WNL, in sitting crossing of legs and occasional B plantarflexion   Iliac crest height: equal Pelvic obliquity: equal    LUMBAR AROM/PROM:  AROM (Normal range in degrees) AROM  05/12/22  Flexion (65) WNL  Extension (25-30) WNL *  Right lateral flexion (25) WNL  Left lateral flexion (25) WNL  Right rotation (30) WNL  Left rotation (30) WNL   (*= pain, Blank rows = not tested)  LOWER EXTREMITY ROM:    ROM (Normal range in degrees) Right 05/19/22 Left 05/19/22  Hip flexion (0-125) WNL WNL  Hip extension (0-15)    Hip abduction (0-40) WNL WNL  Hip adduction    Hip internal rotation (0-45) WNL* WNL*  Hip external rotation (0-45) WNL WNL  Knee flexion WNL WNL  Knee extension    Ankle dorsiflexion    Ankle plantarflexion    Ankle inversion    Ankle eversion     (*= pain, Blank rows = not tested)  LOWER EXTREMITY MMT:  MMT Right 05/12/22 Left 05/12/22  Hip flexion 5 5  Hip extension 5 5  Hip abduction    Hip adduction    Hip internal rotation 5 5  Hip external rotation 5 5  Knee flexion 5 5  Knee extension 5 5  Ankle dorsiflexion (in sitting) 5 5  Ankle plantarflexion    Ankle inversion    Ankle eversion    (*= pain, Blank rows = not tested)   PHYSICAL PERFORMANCE MEASURES:  RLE SLS: PSIS mobility WNL  LLE SLS: PSIS mobility WNL    PALPATION: Abdominal:  Diastasis: 3 finger  above umbilicus with depth to  DIP, 2.5 fingers at umbilicus with depth to DIP and 2 fingers below umbilicus, observed breath holding and doming  Scar: present from c-sections, Pt is not concerned or having pain but will assess in future sessions   EXTERNAL PELVIC EXAM: Patient educated on the purpose of the pelvic exam and articulated understanding; patient consented to the exam verbally. (05/19/22) Breath coordination: present but inconsistent Voluntary Contraction: 2/5 MMT Relaxation: full Perineal movement with sustained IAP increase ("bear down"): no change, gluteal activation Perineal movement with rapid IAP increase ("cough"): no change (0= no contraction, 1= flicker, 2= weak squeeze, 3= fair squeeze with lift, 4= good squeeze and lift against resistance, 5= strong squeeze against strong resistance)     TODAY'S TREATMENT:  Manual Therapy: Quadruped DRAM technique with UE flexion and rotation, 3x5 with compression above, at, and below umbilicus   Taping of DRAM for improved proprioceptive feedback and awareness of posture Pt guided on when to remove tape if redness, bumps, or itchiness occurs   Neuromuscular Re-education: Quadruped diaphragmatic breathing with VCs and TCs to decrease drawing in of abdominals particularly the stomach   Attempted quadruped TrA activation but Pt unable to progress due to bodily compensations (forceful drawing in of abdominals with exhale)  Supine hooklying diaphragmatic breathing for improved IAP management  Review of supine TrA contraction coordinated with exhale and DRAM compression  VCs and tactile feedback required for proper technique, decrease forceful drawing in of lower abdomen and compression with hands  Gentle slide of hands instead of "pulling in" or "pushing down" of hands into abdomen   Seated TrA activation with diaphragmatic breath coordination for improved IAP management and postural stability TCs and VCs required for proper technique    Patient response to interventions:  Pt found quadruped diaphragmatic breathing to be difficult because she has breathed a certain way for so long. Also, Pt found difficult not forcefully drawing in abdominals when performing TrA activation, but after a few repetitions was able to report feeling a difference.     Patient Education:  Added intervention to HEP: quadruped diaphragmatic breathing. Patient educated throughout session on appropriate technique and form using multi-modal cueing, HEP, and activity modification. Patient will continue to benefit from further education in order to maximize compliance and understanding for long-term therapeutic gains.      ASSESSMENT:  Clinical Impression: Patient presents to clinic with excellent motivation to participate in today's session. Pt continues to demonstrates deficit in IAP management, PFM coordination, PFM strength, scar mobility, pain and posture. Pt reported that she has a history of drawing in her abdominal musculature when sitting, standing, and walking. During quadruped diaphragmatic breathing, Pt required significant cueing to allow the abdominals to fully relax upon inhalation and naturally return to position with exhalation. Pt unable to further progression of TrA activation in quadruped due to bodily compensations; however, Pt able to progress without compensations to a seated position. Pt responded well to all other active, manual and educational interventions. Patient will continue to benefit from skilled therapeutic intervention to address deficits in IAP management, PFM coordination, PFM strength, posture, scar mobility, and pain in order to increase PLOF and improve overall QOL.    Objective Impairments: decreased coordination, decreased endurance, decreased mobility, decreased strength, improper body mechanics, and pain.   Activity Limitations: carrying, lifting, bending, squatting, transfers, and caring for others  Personal  Factors: Behavior pattern, Time since onset of injury/illness/exacerbation, and 1 comorbidity: HTN  are also affecting patient's functional outcome.   Rehab Potential: Good  Clinical Decision Making: Evolving/moderate complexity  Evaluation Complexity: Moderate   GOALS: Goals reviewed with patient? Yes  SHORT TERM GOALS: Target date: 07/06/2022  Patient will improve score on FOTO Low Back Pain by at least 5 points in order to demonstrate decreased pain, improved IAP management improved and improved overall QOL.  Baseline: (05/19/22)- 69 (target is 74) Goal status: INITIAL    LONG TERM GOALS: Target date: 08/17/2022    Pt will be able to coordinate activation of deep core musculature with diaphragmatic breathing while performing activities such as, but not limited to: lifting, bending, squatting, caring for young children, and physical activity in order to reduce worst pain by at least 2 points and participate in activities in the home and in the community.  Baseline: 6/10 and demonstrates increased breath holding Goal status: INITIAL  2.  Patient will be independent in the performance of a HEP in order to participate fully, without limitation or disruptions, in activities at home and in the community.  Baseline: TrA activation with DRAM compression Goal status: INITIAL  3.  Patient will be able to demonstrate improved abdominal strengthening with closure of the diastasis recti to 2 fingers or less in order to return to caring for young children and physical activity without limitation or distress.  Baseline: 3 fingers above/DIP depth, 2.5 at/DIP depth, 2 below  Goal status: INITIAL    PLAN: PT Frequency: 1x/week  PT Duration: 12 weeks  Planned Interventions: Therapeutic exercises, Therapeutic activity, Neuromuscular re-education, Balance training, Gait training, Patient/Family education, Joint mobilization, Spinal mobilization, Moist heat, scar mobilization, Taping, and Manual  therapy  Plan For Next Session: manual DRAM technique, myofascial release, continue deep core    Maeva Dant, PT, DPT  05/25/2022, 12:49 PM

## 2022-05-31 DIAGNOSIS — M4603 Spinal enthesopathy, cervicothoracic region: Secondary | ICD-10-CM | POA: Diagnosis not present

## 2022-05-31 DIAGNOSIS — M9901 Segmental and somatic dysfunction of cervical region: Secondary | ICD-10-CM | POA: Diagnosis not present

## 2022-05-31 DIAGNOSIS — M9902 Segmental and somatic dysfunction of thoracic region: Secondary | ICD-10-CM | POA: Diagnosis not present

## 2022-05-31 DIAGNOSIS — M9904 Segmental and somatic dysfunction of sacral region: Secondary | ICD-10-CM | POA: Diagnosis not present

## 2022-05-31 DIAGNOSIS — M9903 Segmental and somatic dysfunction of lumbar region: Secondary | ICD-10-CM | POA: Diagnosis not present

## 2022-05-31 DIAGNOSIS — M5387 Other specified dorsopathies, lumbosacral region: Secondary | ICD-10-CM | POA: Diagnosis not present

## 2022-06-02 ENCOUNTER — Ambulatory Visit: Payer: 59

## 2022-06-02 DIAGNOSIS — M5459 Other low back pain: Secondary | ICD-10-CM

## 2022-06-02 DIAGNOSIS — M6281 Muscle weakness (generalized): Secondary | ICD-10-CM | POA: Diagnosis not present

## 2022-06-02 DIAGNOSIS — R278 Other lack of coordination: Secondary | ICD-10-CM

## 2022-06-02 NOTE — Therapy (Signed)
OUTPATIENT PHYSICAL THERAPY FEMALE PELVIC TREATMENT   Patient Name: Diana Brooks MRN: [id number] DOB:07-11-1981, 41 y.o., female Today's Date: 06/03/2022   PT End of Session - 06/02/22 1618     Visit Number 4    Number of Visits 12    Date for PT Re-Evaluation 08/04/22    PT Start Time 0416    PT Stop Time 0458    PT Time Calculation (min) 42 min    Activity Tolerance Patient tolerated treatment well             Past Medical History:  Diagnosis Date   Anemia    Anxiety    Asthma    exercise induced   Complication of anesthesia    Dysmenorrhea    Endometriosis    Family history of adverse reaction to anesthesia    N/V from mother   GDM (gestational diabetes mellitus)    Hyperprolactinemia ([place])    IBS (irritable bowel syndrome)    Infertility, female    PONV (postoperative nausea and vomiting)    states phenergan helps alot post op n/v   Past Surgical History:  Procedure Laterality Date   CESAREAN SECTION  2013   CESAREAN SECTION N/A 12/24/2020   Procedure: CESAREAN SECTION;  Surgeon: Rubie Maid, MD;  Location: [facility];  Service: Obstetrics;  Laterality: N/A;  REPEAT CESAREAN SECTION   CHROMOPERTUBATION N/A 01/11/2016   Procedure: CHROMOPERTUBATION OF FALLOPIAN TUBES;  Surgeon: Brayton Mars, MD;  Location: [facility];  Service: Gynecology;  Laterality: N/A;   CHROMOPERTUBATION  2012   GANGLION CYST EXCISION Left    wrist   LAPAROSCOPY     lap with excision of fulguration of endometriosis   LAPAROSCOPY N/A 01/11/2016   Procedure: LAPAROSCOPY DIAGNOSTIC/ WITH EXCISION AND FULGERATION OF ENDOMETRIOSIS; MYOMECTOMY;  Surgeon: Brayton Mars, MD;  Location: [facility];  Service: Gynecology;  Laterality: N/A;   skin cancer removal  03/10/2021   forehead   Patient Active Problem List   Diagnosis Date Noted   Hx of migraines 06/27/2020   Mild exercise-induced asthma 01/23/2020   BMI 25.0-25.9,adult 01/23/2020   Hyperlipidemia LDL goal <100  01/23/2020   History of gestational diabetes 01/23/2020   History of squamous cell carcinoma excision 01/23/2020   History of dysplastic nevus 01/23/2020   Ganglion of wrist 01/22/2020   Endometriosis 10/22/2015   Infertility, female, secondary 10/22/2015   Cyst of nipple 10/22/2015    PCP: Randel Pigg, MD (thru employer)  REFERRING PROVIDER: Rubie Maid, MD  REFERRING DIAG: M62.08 (ICD-10-CM) - Diastasis of rectus abdominis  THERAPY DIAG:  Muscle weakness (generalized)  Other lack of coordination  Other low back pain  Rationale for Evaluation and Treatment: Rehabilitation  ONSET DATE: A few years  PRECAUTIONS: None  WEIGHT BEARING RESTRICTIONS: No  FALLS:  Has patient fallen in last 6 months? 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 Pt has pretty regular low back pain but worsened after having children. The back pain did not necessarily get worse with the second child but it can be constant at times. Children are 30 y.o and 24 months old. Pt does have a hx of doing crunches before the 1st child but has learned from information online to avoid motions such as trunk flexion. Pt is not breastfeeding anymore. Pt is also seeing a chiropractor for low back pain and some days has no pain at all. She has tried doing some exercises she found online, supine with toe taps, and did feel sore the next day as if she did work her abdominals.     LIVING ENVIRONMENT: Lives with: lives with their family and lives with their spouse Lives in: House/apartment   PATIENT GOALS: I  want to get rid of the diastasis and be able to feel comfortable wearing clothes and in my body.     SUBJECTIVE:  Pt reports that taping lasted 2 days and started to itch. Pt felt it helped with her postural awareness. Pt reports not feeling LBP as much as she usually has.   PAIN:  Are you having pain? Yes NPRS scale: 1/10  Pain location: low back pain  Pain type: aching and dull Pain description: dull and aching    OBJECTIVE:    COGNITION: (05/12/22) Overall cognitive status: Within functional limits for tasks assessed     POSTURE: (05/12/22) Grossly WNL, in sitting crossing of legs and occasional B plantarflexion   Iliac crest height: equal Pelvic obliquity: equal    LUMBAR AROM/PROM:  AROM (Normal range in degrees) AROM  05/12/22  Flexion (65) WNL  Extension (25-30) WNL *  Right lateral flexion (25) WNL  Left lateral flexion (25) WNL  Right rotation (30) WNL  Left rotation (30) WNL   (*= pain, Blank rows = not tested)  LOWER EXTREMITY ROM:    ROM (Normal range in degrees) Right 05/19/22 Left 05/19/22  Hip flexion (0-125) WNL WNL  Hip extension (0-15)    Hip abduction (0-40) WNL WNL  Hip adduction    Hip internal rotation (0-45) WNL* WNL*  Hip external rotation (0-45) WNL WNL  Knee flexion WNL WNL  Knee extension    Ankle dorsiflexion    Ankle plantarflexion    Ankle inversion    Ankle eversion     (*= pain, Blank rows = not tested)  LOWER EXTREMITY MMT:  MMT Right 05/12/22 Left 05/12/22  Hip flexion 5 5  Hip extension 5 5  Hip abduction    Hip adduction    Hip internal rotation 5 5  Hip external rotation 5 5  Knee flexion 5 5  Knee extension 5 5  Ankle dorsiflexion (in sitting) 5 5  Ankle plantarflexion    Ankle inversion    Ankle eversion    (*= pain, Blank rows = not tested)   PHYSICAL PERFORMANCE MEASURES:  RLE SLS: PSIS mobility WNL  LLE SLS: PSIS mobility WNL    PALPATION: Abdominal:  Diastasis: 3 finger above umbilicus with  depth to DIP, 2.5  fingers at umbilicus with depth to DIP and 2 fingers below umbilicus, observed breath holding and doming  Scar: present from c-sections, Pt is not concerned or having pain but will assess in future sessions   EXTERNAL PELVIC EXAM: Patient educated on the purpose of the pelvic exam and articulated understanding; patient consented to the exam verbally. (05/19/22) Breath coordination: present but inconsistent Voluntary Contraction: 2/5 MMT Relaxation: full Perineal movement with sustained IAP increase ("bear down"): no change, gluteal activation Perineal movement with rapid IAP increase ("cough"): no change (0= no contraction, 1= flicker, 2= weak squeeze, 3= fair squeeze with lift, 4= good squeeze and lift against resistance, 5= strong squeeze against strong resistance)     TODAY'S TREATMENT:  Manual Therapy: Quadruped DRAM technique with UE flexion and rotation, 3x5 with compression above, at, and below umbilicus   Myofascial abdominal techinque for improved abdominal tension and mobility of fascial slings   Scar mobilization at c-section scar for improved scar restriction   Neuromuscular Re-education: Reassessment of DRA:  Diastasis: Very close to 2.5 fingers above umbilicus with decreased depth, 2.5 fingers at umbilicus with depth to DIP and 1.5 fingers below umbilicus, decreased doming    Standing modified wall plank with mini squat (TrA contraction) and coordinated breath for improved IAP management  VCs for proper technique    Patient response to interventions:  Pt enjoyed standing active intervention because she could feel the subtle tightening of the deep core.     Patient Education:  Added intervention to HEP: modified wall plank with mini squat. Patient educated throughout session on appropriate technique and form using multi-modal cueing, HEP, and activity modification. Patient will continue to benefit from further education in order to maximize  compliance and understanding for long-term therapeutic gains.      ASSESSMENT:  Clinical Impression: Patient presents to clinic with excellent motivation to participate in today's session. Pt continues to demonstrates deficit in IAP management, PFM coordination, PFM strength, scar mobility, pain and posture. Pt reports a decrease in low back pain (1/10) from initial evaluation and throughout the day as she has suffered from LBP for many years. Upon brief re-assessment, Pt has improved in IAP management as DRA is now almost to 2.5 fingers with decreased depth above the umbilicus (IE: 3 fingers/DIP depth) and 1.5 fingers below the umbilicus (IE: 2 fingers), and demonstrating decreased doming. Pt with increased myofascial tension along the LLQ/RLQ and scar restriction along c-section scar causing discomfort upon deep palpation. Pt with improved tenderness and tension with increased time of manual technique. Pt responded well to all other active, manual and educational interventions. Patient will continue to benefit from skilled therapeutic intervention to address deficits in IAP management, PFM coordination, PFM strength, posture, scar mobility, and pain in order to increase PLOF and improve overall QOL.    Objective Impairments: decreased coordination, decreased endurance, decreased mobility, decreased strength, improper body mechanics, and pain.   Activity Limitations: carrying, lifting, bending, squatting, transfers, and caring for others  Personal Factors: Behavior pattern, Time since onset of injury/illness/exacerbation, and 1 comorbidity: HTN  are also affecting patient's functional outcome.   Rehab Potential: Good  Clinical Decision Making: Evolving/moderate complexity  Evaluation Complexity: Moderate   GOALS: Goals reviewed with patient? Yes  SHORT TERM GOALS: Target date: 07/15/2022  Patient will improve score on FOTO Low Back Pain by at least 5 points in order to demonstrate  decreased pain, improved IAP management improved and improved overall QOL.  Baseline: (05/19/22)- 69 (target is 74) Goal  status: INITIAL    LONG TERM GOALS: Target date: 08/26/2022    Pt will be able to coordinate activation of deep core musculature with diaphragmatic breathing while performing activities such as, but not limited to: lifting, bending, squatting, caring for young children, and physical activity in order to reduce worst pain by at least 2 points and participate in activities in the home and in the community.  Baseline: 6/10 and demonstrates increased breath holding Goal status: INITIAL  2.  Patient will be independent in the performance of a HEP in order to participate fully, without limitation or disruptions, in activities at home and in the community.  Baseline: TrA activation with DRAM compression Goal status: INITIAL  3.  Patient will be able to demonstrate improved abdominal strengthening with closure of the diastasis recti to 2 fingers or less in order to return to caring for young children and physical activity without limitation or distress.  Baseline: 3 fingers above/DIP depth, 2.5 at/DIP depth, 2 below  Goal status: INITIAL    PLAN: PT Frequency: 1x/week  PT Duration: 12 weeks  Planned Interventions: Therapeutic exercises, Therapeutic activity, Neuromuscular re-education, Balance training, Gait training, Patient/Family education, Joint mobilization, Spinal mobilization, Moist heat, scar mobilization, Taping, and Manual therapy  Plan For Next Session: myofascial technique/scar mobs, continue deep core progression    Kaidyn Javid, PT, DPT  06/03/2022, 8:41 AM

## 2022-06-07 DIAGNOSIS — M9902 Segmental and somatic dysfunction of thoracic region: Secondary | ICD-10-CM | POA: Diagnosis not present

## 2022-06-07 DIAGNOSIS — M9903 Segmental and somatic dysfunction of lumbar region: Secondary | ICD-10-CM | POA: Diagnosis not present

## 2022-06-07 DIAGNOSIS — M4603 Spinal enthesopathy, cervicothoracic region: Secondary | ICD-10-CM | POA: Diagnosis not present

## 2022-06-07 DIAGNOSIS — M9904 Segmental and somatic dysfunction of sacral region: Secondary | ICD-10-CM | POA: Diagnosis not present

## 2022-06-07 DIAGNOSIS — M5387 Other specified dorsopathies, lumbosacral region: Secondary | ICD-10-CM | POA: Diagnosis not present

## 2022-06-07 DIAGNOSIS — M9901 Segmental and somatic dysfunction of cervical region: Secondary | ICD-10-CM | POA: Diagnosis not present

## 2022-06-09 ENCOUNTER — Ambulatory Visit: Payer: 59

## 2022-06-09 DIAGNOSIS — R278 Other lack of coordination: Secondary | ICD-10-CM

## 2022-06-09 DIAGNOSIS — M6281 Muscle weakness (generalized): Secondary | ICD-10-CM

## 2022-06-09 DIAGNOSIS — M5459 Other low back pain: Secondary | ICD-10-CM

## 2022-06-09 NOTE — Therapy (Signed)
OUTPATIENT PHYSICAL THERAPY FEMALE PELVIC TREATMENT   Patient Name: Diana Brooks MRN: [id number] DOB:[birth year], 41 y.o., female Today's Date: 06/09/2022   PT End of Session - 06/09/22 1624     Visit Number 5    Number of Visits 12    Date for PT Re-Evaluation 08/04/22    PT Start Time 52    PT Stop Time 1703    PT Time Calculation (min) 38 min    Activity Tolerance Patient tolerated treatment well             Past Medical History:  Diagnosis Date   Anemia    Anxiety    Asthma    exercise induced   Complication of anesthesia    Dysmenorrhea    Endometriosis    Family history of adverse reaction to anesthesia    N/V from mother   GDM (gestational diabetes mellitus)    Hyperprolactinemia ([place])    IBS (irritable bowel syndrome)    Infertility, female    PONV (postoperative nausea and vomiting)    states phenergan helps alot post op n/v   Past Surgical History:  Procedure Laterality Date   CESAREAN SECTION  2013   CESAREAN SECTION N/A 12/24/2020   Procedure: CESAREAN SECTION;  Surgeon: Rubie Maid, MD;  Location: [facility];  Service: Obstetrics;  Laterality: N/A;  REPEAT CESAREAN SECTION   CHROMOPERTUBATION N/A 01/11/2016   Procedure: CHROMOPERTUBATION OF FALLOPIAN TUBES;  Surgeon: Brayton Mars, MD;  Location: [facility];  Service: Gynecology;  Laterality: N/A;   CHROMOPERTUBATION  2012   GANGLION CYST EXCISION Left    wrist   LAPAROSCOPY     lap with excision of fulguration of endometriosis   LAPAROSCOPY N/A 01/11/2016   Procedure: LAPAROSCOPY DIAGNOSTIC/ WITH EXCISION AND FULGERATION OF ENDOMETRIOSIS; MYOMECTOMY;  Surgeon: Brayton Mars, MD;  Location: [facility];  Service: Gynecology;  Laterality: N/A;   skin cancer removal  03/10/2021   forehead   Patient Active Problem List   Diagnosis Date Noted   Hx of migraines 06/27/2020   Mild exercise-induced asthma 01/23/2020   BMI 25.0-25.9,adult 01/23/2020   Hyperlipidemia LDL goal <100  01/23/2020   History of gestational diabetes 01/23/2020   History of squamous cell carcinoma excision 01/23/2020   History of dysplastic nevus 01/23/2020   Ganglion of wrist 01/22/2020   Endometriosis 10/22/2015   Infertility, female, secondary 10/22/2015   Cyst of nipple 10/22/2015    PCP: Randel Pigg, MD (thru employer)  REFERRING PROVIDER: Rubie Maid, MD  REFERRING DIAG: M62.08 (ICD-10-CM) - Diastasis of rectus abdominis  THERAPY DIAG:  Muscle weakness (generalized)  Other lack of coordination  Other low back pain  Rationale for Evaluation and Treatment: Rehabilitation  ONSET DATE: A few years  PRECAUTIONS: None  WEIGHT BEARING RESTRICTIONS: No  FALLS:  Has patient fallen in last 6 months? 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 Pt has pretty regular low back pain but worsened after having children. The back pain did not necessarily get worse with the second child but it can be constant at times. Children are 53 y.o and 72 months old. Pt does have a hx of doing crunches before the 1st child but has learned from information online to avoid motions such as trunk flexion. Pt is not breastfeeding anymore. Pt is also seeing a chiropractor for low back pain and some days has no pain at all. She has tried doing some exercises she found online, supine with toe taps, and did feel sore the next day as if she did work her abdominals.     LIVING ENVIRONMENT: Lives with: lives with their family and lives with their spouse Lives in: House/apartment   PATIENT GOALS: I  want to get rid of the diastasis and be able to feel comfortable wearing clothes and in my body.     SUBJECTIVE:  Pt reports HEP is going well and did not feel soreness after manual interventions from last session.    PAIN:  Are you having pain? No NPRS scale: 0/10 Pain location: low back    OBJECTIVE:    COGNITION: (05/12/22) Overall cognitive status: Within functional limits for tasks assessed     POSTURE: (05/12/22) Grossly WNL, in sitting crossing of legs and occasional B plantarflexion   Iliac crest height: equal Pelvic obliquity: equal    LUMBAR AROM/PROM:  AROM (Normal range in degrees) AROM  05/12/22  Flexion (65) WNL  Extension (25-30) WNL *  Right lateral flexion (25) WNL  Left lateral flexion (25) WNL  Right rotation (30) WNL  Left rotation (30) WNL   (*= pain, Blank rows = not tested)  LOWER EXTREMITY ROM:    ROM (Normal range in degrees) Right 05/19/22 Left 05/19/22  Hip flexion (0-125) WNL WNL  Hip extension (0-15)    Hip abduction (0-40) WNL WNL  Hip adduction    Hip internal rotation (0-45) WNL* WNL*  Hip external rotation (0-45) WNL WNL  Knee flexion WNL WNL  Knee extension    Ankle dorsiflexion    Ankle plantarflexion    Ankle inversion    Ankle eversion     (*= pain, Blank rows = not tested)  LOWER EXTREMITY MMT:  MMT Right 05/12/22 Left 05/12/22  Hip flexion 5 5  Hip extension 5 5  Hip abduction    Hip adduction    Hip internal rotation 5 5  Hip external rotation 5 5  Knee flexion 5 5  Knee extension 5 5  Ankle dorsiflexion (in sitting) 5 5  Ankle plantarflexion    Ankle inversion    Ankle eversion    (*= pain, Blank rows = not tested)   PHYSICAL PERFORMANCE MEASURES:  RLE SLS: PSIS mobility WNL  LLE SLS: PSIS mobility WNL    PALPATION: Abdominal:  Diastasis: 3 finger above umbilicus with depth to DIP, 2.5 fingers at umbilicus with depth to DIP and 2 fingers below umbilicus, observed breath holding and doming  Scar:  present from c-sections, Pt is not  concerned or having pain but will assess in future sessions   EXTERNAL PELVIC EXAM: Patient educated on the purpose of the pelvic exam and articulated understanding; patient consented to the exam verbally. (05/19/22) Breath coordination: present but inconsistent Voluntary Contraction: 2/5 MMT Relaxation: full Perineal movement with sustained IAP increase ("bear down"): no change, gluteal activation Perineal movement with rapid IAP increase ("cough"): no change (0= no contraction, 1= flicker, 2= weak squeeze, 3= fair squeeze with lift, 4= good squeeze and lift against resistance, 5= strong squeeze against strong resistance)     TODAY'S TREATMENT:  Manual Therapy: Myofascial abdominal techinque for improved abdominal tension and mobility of fascial slings   Scar mobilization at c-section scar for improved scar restriction   Neuromuscular Re-education: Supine hooklying diaphragmatic breathing with VCs and TCs for downregulation of the nervous system and improved management of IAP  Supine hooklying modified bird dog with LE movement and UE stability (in supine punch position) for improved IAP management  With breath coordination  Pallof press with GTB and coordinated breath, B x15, for improved IAP management, VCs and TCs for proper technique    Patient response to interventions:  Pt with some increased tension in abdomen more centrally this visit compared to RLQ.     Patient Education:  Added intervention to HEP: pallof press, modified bird dog. Patient educated throughout session on appropriate technique and form using multi-modal cueing, HEP, and activity modification. Patient will continue to benefit from further education in order to maximize compliance and understanding for long-term therapeutic gains.      ASSESSMENT:  Clinical Impression: Patient presents to clinic with excellent motivation to participate in today's session. Pt continues  to demonstrates deficit in IAP management, PFM coordination, PFM strength, scar mobility, pain and posture. Pt reports no current pain in the low back. Pt with improved myofascial tension along the LLQ/RLQ and improved scar restriction at c-section scar. Pt did have increased tension more centrally this visit but improved with increased time of manual technique. Pt required moderate VCs and TCs for proper technique and to reduce bodily compensations. Pt responded well to progression of the deep core and all manual and educational interventions. Patient will continue to benefit from skilled therapeutic intervention to address deficits in IAP management, PFM coordination, PFM strength, posture, scar mobility, and pain in order to increase PLOF and improve overall QOL.    Objective Impairments: decreased coordination, decreased endurance, decreased mobility, decreased strength, improper body mechanics, and pain.   Activity Limitations: carrying, lifting, bending, squatting, transfers, and caring for others  Personal Factors: Behavior pattern, Time since onset of injury/illness/exacerbation, and 1 comorbidity: HTN  are also affecting patient's functional outcome.   Rehab Potential: Good  Clinical Decision Making: Evolving/moderate complexity  Evaluation Complexity: Moderate   GOALS: Goals reviewed with patient? Yes  SHORT TERM GOALS: Target date: 07/21/2022  Patient will improve score on FOTO Low Back Pain by at least 5 points in order to demonstrate decreased pain, improved IAP management improved and improved overall QOL.  Baseline: (05/19/22)- 69 (target is 74) Goal status: INITIAL    LONG TERM GOALS: Target date: 09/01/2022    Pt will be able to coordinate activation of deep core musculature with diaphragmatic breathing while performing activities such as, but not limited to: lifting, bending, squatting, caring for young children, and physical activity in order to reduce worst pain by at  least 2 points and participate in activities in the home and in the community.  Baseline: 6/10 and demonstrates  increased breath holding Goal status: INITIAL  2.  Patient will be independent in the performance of a HEP in order to participate fully, without limitation or disruptions, in activities at home and in the community.  Baseline: TrA activation with DRAM compression Goal status: INITIAL  3.  Patient will be able to demonstrate improved abdominal strengthening with closure of the diastasis recti to 2 fingers or less in order to return to caring for young children and physical activity without limitation or distress.  Baseline: 3 fingers above/DIP depth, 2.5 at/DIP depth, 2 below  Goal status: INITIAL    PLAN: PT Frequency: 1x/week  PT Duration: 12 weeks  Planned Interventions: Therapeutic exercises, Therapeutic activity, Neuromuscular re-education, Balance training, Gait training, Patient/Family education, Joint mobilization, Spinal mobilization, Moist heat, scar mobilization, Taping, and Manual therapy  Plan For Next Session: FOTO, progress deep core    Taneshia Lorence, PT, DPT  06/09/2022, 5:06 PM

## 2022-06-10 DIAGNOSIS — J0100 Acute maxillary sinusitis, unspecified: Secondary | ICD-10-CM | POA: Diagnosis not present

## 2022-06-14 ENCOUNTER — Ambulatory Visit: Payer: 59

## 2022-06-14 DIAGNOSIS — M5459 Other low back pain: Secondary | ICD-10-CM | POA: Diagnosis not present

## 2022-06-14 DIAGNOSIS — R278 Other lack of coordination: Secondary | ICD-10-CM | POA: Diagnosis not present

## 2022-06-14 DIAGNOSIS — M6281 Muscle weakness (generalized): Secondary | ICD-10-CM

## 2022-06-14 NOTE — Therapy (Signed)
OUTPATIENT PHYSICAL THERAPY FEMALE PELVIC TREATMENT   Patient Name: Diana Brooks MRN: [id number] DOB:[birth year], 41 y.o., female Today's Date: 06/14/2022   PT End of Session - 06/14/22 1103     Visit Number 6    Number of Visits 12    Date for PT Re-Evaluation 08/04/22    PT Start Time 1101    PT Stop Time 1141    PT Time Calculation (min) 40 min    Activity Tolerance Patient tolerated treatment well             Past Medical History:  Diagnosis Date   Anemia    Anxiety    Asthma    exercise induced   Complication of anesthesia    Dysmenorrhea    Endometriosis    Family history of adverse reaction to anesthesia    N/V from mother   GDM (gestational diabetes mellitus)    Hyperprolactinemia ([place])    IBS (irritable bowel syndrome)    Infertility, female    PONV (postoperative nausea and vomiting)    states phenergan helps alot post op n/v   Past Surgical History:  Procedure Laterality Date   CESAREAN SECTION  2013   CESAREAN SECTION N/A 12/24/2020   Procedure: CESAREAN SECTION;  Surgeon: Rubie Maid, MD;  Location: [facility];  Service: Obstetrics;  Laterality: N/A;  REPEAT CESAREAN SECTION   CHROMOPERTUBATION N/A 01/11/2016   Procedure: CHROMOPERTUBATION OF FALLOPIAN TUBES;  Surgeon: Brayton Mars, MD;  Location: [facility];  Service: Gynecology;  Laterality: N/A;   CHROMOPERTUBATION  2012   GANGLION CYST EXCISION Left    wrist   LAPAROSCOPY     lap with excision of fulguration of endometriosis   LAPAROSCOPY N/A 01/11/2016   Procedure: LAPAROSCOPY DIAGNOSTIC/ WITH EXCISION AND FULGERATION OF ENDOMETRIOSIS; MYOMECTOMY;  Surgeon: Brayton Mars, MD;  Location: [facility];  Service: Gynecology;  Laterality: N/A;   skin cancer removal  03/10/2021   forehead   Patient Active Problem List   Diagnosis Date Noted   Hx of migraines 06/27/2020   Mild exercise-induced asthma 01/23/2020   BMI 25.0-25.9,adult 01/23/2020   Hyperlipidemia LDL goal <100  01/23/2020   History of gestational diabetes 01/23/2020   History of squamous cell carcinoma excision 01/23/2020   History of dysplastic nevus 01/23/2020   Ganglion of wrist 01/22/2020   Endometriosis 10/22/2015   Infertility, female, secondary 10/22/2015   Cyst of nipple 10/22/2015    PCP: Randel Pigg, MD (thru employer)  REFERRING PROVIDER: Rubie Maid, MD  REFERRING DIAG: M62.08 (ICD-10-CM) - Diastasis of rectus abdominis  THERAPY DIAG:  Muscle weakness (generalized)  Other lack of coordination  Other low back pain  Rationale for Evaluation and Treatment: Rehabilitation  ONSET DATE: A few years  PRECAUTIONS: None  WEIGHT BEARING RESTRICTIONS: No  FALLS:  Has patient fallen in last 6 months? 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 Pt has pretty regular low back pain but worsened after having children. The back pain did not necessarily get worse with the second child but it can be constant at times. Children are 35 y.o and 72 months old. Pt does have a hx of doing crunches before the 1st child but has learned from information online to avoid motions such as trunk flexion. Pt is not breastfeeding anymore. Pt is also seeing a chiropractor for low back pain and some days has no pain at all. She has tried doing some exercises she found online, supine with toe taps, and did feel sore the next day as if she did work her abdominals.     LIVING ENVIRONMENT: Lives with: lives with their family and lives with their spouse Lives in: House/apartment   PATIENT GOALS: I  want to get rid of the diastasis and be able to feel comfortable wearing clothes and in my body.     SUBJECTIVE:  Pt had a busy weekend and feels she did not have a lot of time to dedicate to HEP.    PAIN:  Are you having pain? No NPRS scale: 0/10 Pain location: low back    OBJECTIVE:    COGNITION: (05/12/22) Overall cognitive status: Within functional limits for tasks assessed     POSTURE: (05/12/22) Grossly WNL, in sitting crossing of legs and occasional B plantarflexion   Iliac crest height: equal Pelvic obliquity: equal    LUMBAR AROM/PROM:  AROM (Normal range in degrees) AROM  05/12/22  Flexion (65) WNL  Extension (25-30) WNL *  Right lateral flexion (25) WNL  Left lateral flexion (25) WNL  Right rotation (30) WNL  Left rotation (30) WNL   (*= pain, Blank rows = not tested)  LOWER EXTREMITY ROM:    ROM (Normal range in degrees) Right 05/19/22 Left 05/19/22  Hip flexion (0-125) WNL WNL  Hip extension (0-15)    Hip abduction (0-40) WNL WNL  Hip adduction    Hip internal rotation (0-45) WNL* WNL*  Hip external rotation (0-45) WNL WNL  Knee flexion WNL WNL  Knee extension    Ankle dorsiflexion    Ankle plantarflexion    Ankle inversion    Ankle eversion     (*= pain, Blank rows = not tested)  LOWER EXTREMITY MMT:  MMT Right 05/12/22 Left 05/12/22  Hip flexion 5 5  Hip extension 5 5  Hip abduction    Hip adduction    Hip internal rotation 5 5  Hip external rotation 5 5  Knee flexion 5 5  Knee extension 5 5  Ankle dorsiflexion (in sitting) 5 5  Ankle plantarflexion    Ankle inversion    Ankle eversion    (*= pain, Blank rows = not tested)   PHYSICAL PERFORMANCE MEASURES:  RLE SLS: PSIS mobility WNL  LLE SLS: PSIS mobility WNL    PALPATION: Abdominal:  Diastasis: 3 finger above umbilicus with depth to DIP, 2.5 fingers at umbilicus with depth to DIP and 2 fingers below umbilicus, observed breath holding and doming  Scar: present from  c-sections, Pt  is not concerned or having pain but will assess in future sessions   EXTERNAL PELVIC EXAM: Patient educated on the purpose of the pelvic exam and articulated understanding; patient consented to the exam verbally. (05/19/22) Breath coordination: present but inconsistent Voluntary Contraction: 2/5 MMT Relaxation: full Perineal movement with sustained IAP increase ("bear down"): no change, gluteal activation Perineal movement with rapid IAP increase ("cough"): no change (0= no contraction, 1= flicker, 2= weak squeeze, 3= fair squeeze with lift, 4= good squeeze and lift against resistance, 5= strong squeeze against strong resistance)     TODAY'S TREATMENT:   Neuromuscular Re-education: Re-assessment of STG - FOTO  FOTO lumbar spine - see below  Discussed results with Pt and praised for improvement   Reassessment of DRA:  Diastasis: Very close to 2.5 fingers above umbilicus with decreased depth, 2.5 fingers at umbilicus and 1 finger below umbilicus, no doming observed   Quadruped diaphragmatic breathing for improved IAP management, VCs and TCs as needed   Quadruped TrA activation with coordinated exhale for improved IAP management UE/LE challenge given with VCs and TCs to decrease forceful pull in of superficial abdominals   Patient response to interventions:  Pt content with improvement in back pain and progress of closure of DRA and overall strength     Patient Education:  Added intervention to HEP: quadruped TrA activation, modified bird dog. Patient educated throughout session on appropriate technique and form using multi-modal cueing, HEP, and activity modification. Patient will continue to benefit from further education in order to maximize compliance and understanding for long-term therapeutic gains.      ASSESSMENT:  Clinical Impression: Patient presents to clinic with excellent motivation to participate in today's session. Pt continues to demonstrates  deficit in IAP management, PFM coordination, PFM strength, scar mobility, pain and posture. Upon re-assessment of STG, Pt met goal of achieving above a 74 on FOTO Lumbar Spine (83) which indicates a 14 point change from initial evaluation. Pt also reports decreased constant low back pain compared to IE. Pt continues to demonstrate DRA particularly 2.5 fingers above the umbilicus; however, with decreased depth and 2.5 fingers at umbilicus and 1 finger below umbilicus (change from 2 weeks ago where Pt had 1.5 fingers). Pt also is observed to have no doming during DRA assessment, which is significant improvement from IE. Pt tolerated progression of deep core with VCs and TCs to reduce bodily compensations (forceful pull inwards of abdominals during quadruped). Pt responded well to progress and all active and educational interventions. Patient will continue to benefit from skilled therapeutic intervention to address deficits in IAP management, PFM coordination, PFM strength, posture, scar mobility, and pain in order to increase PLOF and improve overall QOL.    Objective Impairments: decreased coordination, decreased endurance, decreased mobility, decreased strength, improper body mechanics, and pain.   Activity Limitations: carrying, lifting, bending, squatting, transfers, and caring for others  Personal Factors: Behavior pattern, Time since onset of injury/illness/exacerbation, and 1 comorbidity: HTN  are also affecting patient's functional outcome.   Rehab Potential: Good  Clinical Decision Making: Evolving/moderate complexity  Evaluation Complexity: Moderate   GOALS: Goals reviewed with patient? Yes  SHORT TERM GOALS: Target date: 07/26/2022  Patient will improve score on FOTO Low Back Pain by at least 5 points in order to demonstrate decreased pain, improved IAP management improved and improved overall QOL.  Baseline: (05/19/22)- 69 (target is 74), (7/25)- 83  Goal status: MET    LONG TERM  GOALS: Target date: 09/06/2022  Pt will be able to coordinate activation of deep core musculature with diaphragmatic breathing while performing activities such as, but not limited to: lifting, bending, squatting, caring for young children, and physical activity in order to reduce worst pain by at least 2 points and participate in activities in the home and in the community.  Baseline: 6/10 and demonstrates increased breath holding Goal status: INITIAL  2.  Patient will be independent in the performance of a HEP in order to participate fully, without limitation or disruptions, in activities at home and in the community.  Baseline: TrA activation with DRAM compression Goal status: INITIAL  3.  Patient will be able to demonstrate improved abdominal strengthening with closure of the diastasis recti to 2 fingers or less in order to return to caring for young children and physical activity without limitation or distress.  Baseline: 3 fingers above/DIP depth, 2.5 at/DIP depth, 2 below  Goal status: INITIAL    PLAN: PT Frequency: 1x/week  PT Duration: 12 weeks  Planned Interventions: Therapeutic exercises, Therapeutic activity, Neuromuscular re-education, Balance training, Gait training, Patient/Family education, Joint mobilization, Spinal mobilization, Moist heat, scar mobilization, Taping, and Manual therapy  Plan For Next Session: how were the past 2 weeks? Progress deep core, myofascial release?   Ruqaya Strauss, PT, DPT  06/14/2022, 11:04 AM

## 2022-06-16 ENCOUNTER — Ambulatory Visit: Payer: Self-pay | Admitting: Physician Assistant

## 2022-06-16 ENCOUNTER — Encounter: Payer: Self-pay | Admitting: Physician Assistant

## 2022-06-16 VITALS — BP 102/63 | HR 96 | Temp 98.2°F | Resp 16

## 2022-06-16 DIAGNOSIS — M9901 Segmental and somatic dysfunction of cervical region: Secondary | ICD-10-CM | POA: Diagnosis not present

## 2022-06-16 DIAGNOSIS — M9902 Segmental and somatic dysfunction of thoracic region: Secondary | ICD-10-CM | POA: Diagnosis not present

## 2022-06-16 DIAGNOSIS — M9904 Segmental and somatic dysfunction of sacral region: Secondary | ICD-10-CM | POA: Diagnosis not present

## 2022-06-16 DIAGNOSIS — M5387 Other specified dorsopathies, lumbosacral region: Secondary | ICD-10-CM | POA: Diagnosis not present

## 2022-06-16 DIAGNOSIS — M4603 Spinal enthesopathy, cervicothoracic region: Secondary | ICD-10-CM | POA: Diagnosis not present

## 2022-06-16 DIAGNOSIS — K219 Gastro-esophageal reflux disease without esophagitis: Secondary | ICD-10-CM

## 2022-06-16 DIAGNOSIS — M9903 Segmental and somatic dysfunction of lumbar region: Secondary | ICD-10-CM | POA: Diagnosis not present

## 2022-06-16 MED ORDER — ESOMEPRAZOLE MAGNESIUM 40 MG PO CPDR: 40.0000 mg | DELAYED_RELEASE_CAPSULE | Freq: Every day | ORAL | 3 refills | Status: DC

## 2022-06-16 NOTE — Progress Notes (Signed)
   Subjective: Epigastric pain    Patient ID: Diana Brooks, female    DOB: 1980/11/22, 41 y.o.   MRN: [id number]  HPI Patient complain epigastric pain for approximately 2 weeks.  Patient a pain increases after going to bed and is worse with a.m. awakening.  Patient denies dyspnea or chest pain.  Patient states she took a Z-Pak last week because she thought she had a respiratory infection.  Stated no change in condition.  Patient states she eats last meals around 7 PM.  Denies vomiting.  Patient with pain gets better during the day.  Describes the pain as "burning sensation".  No palliative measure for complaint.   Review of Systems Negative except for chief complaint    Objective:   Physical Exam BP is 102/63, pulse 96, respirations 16, temperature 98.2, patient is 90% O2 sat on room air.  Patient weighs 158 pounds.  HEENT is unremarkable.  Neck is supple without lymphadenopathy.  Lungs are clear to auscultation.  Heart regular rate and rhythm.  Abdomen with negative HSM, normoactive bowel sounds, soft, nontender to palpation.     Assessment & Plan: GERD   Patient given a prescription for Nexium 40 mg to take daily.  Patient advised to avoid late meals and use extra pillows to raise her head while sleeping.  Follow-up in 1 month.

## 2022-06-16 NOTE — Progress Notes (Signed)
Treated recently with a Zpack  - completed Monday  Still feels like there's something in her lungs & wants someone to listen to her lungs, because she's supposed to leave this coming Saturday for week of vacation.  S/Sx - 1st thing in the AM feels like burning in her chest & it goes away during the day. Had some tingling in her nose & sinus pressure Denies H/A, teeth discomfort, ear discomfort, no cough Hasn't run any fever (maybe 2 weeks ago when this started)  AMD

## 2022-06-29 ENCOUNTER — Ambulatory Visit: Payer: 59 | Attending: Obstetrics and Gynecology

## 2022-06-29 DIAGNOSIS — M6281 Muscle weakness (generalized): Secondary | ICD-10-CM

## 2022-06-29 DIAGNOSIS — M5459 Other low back pain: Secondary | ICD-10-CM | POA: Diagnosis not present

## 2022-06-29 DIAGNOSIS — R278 Other lack of coordination: Secondary | ICD-10-CM

## 2022-06-29 NOTE — Therapy (Signed)
OUTPATIENT PHYSICAL THERAPY FEMALE PELVIC TREATMENT   Patient Name: Diana Brooks MRN: [id number] DOB:[birth year], 41 y.o., female Today's Date: 06/29/2022   PT End of Session - 06/29/22 1105     Visit Number 7    Number of Visits 12    Date for PT Re-Evaluation 08/04/22    PT Start Time 1105    PT Stop Time 1140    PT Time Calculation (min) 35 min    Activity Tolerance Patient tolerated treatment well             Past Medical History:  Diagnosis Date   Anemia    Anxiety    Asthma    exercise induced   Complication of anesthesia    Dysmenorrhea    Endometriosis    Family history of adverse reaction to anesthesia    N/V from mother   GDM (gestational diabetes mellitus)    Hyperprolactinemia ([place])    IBS (irritable bowel syndrome)    Infertility, female    PONV (postoperative nausea and vomiting)    states phenergan helps alot post op n/v   Past Surgical History:  Procedure Laterality Date   CESAREAN SECTION  2013   CESAREAN SECTION N/A 12/24/2020   Procedure: CESAREAN SECTION;  Surgeon: Rubie Maid, MD;  Location: [facility];  Service: Obstetrics;  Laterality: N/A;  REPEAT CESAREAN SECTION   CHROMOPERTUBATION N/A 01/11/2016   Procedure: CHROMOPERTUBATION OF FALLOPIAN TUBES;  Surgeon: Brayton Mars, MD;  Location: [facility];  Service: Gynecology;  Laterality: N/A;   CHROMOPERTUBATION  2012   GANGLION CYST EXCISION Left    wrist   LAPAROSCOPY     lap with excision of fulguration of endometriosis   LAPAROSCOPY N/A 01/11/2016   Procedure: LAPAROSCOPY DIAGNOSTIC/ WITH EXCISION AND FULGERATION OF ENDOMETRIOSIS; MYOMECTOMY;  Surgeon: Brayton Mars, MD;  Location: [facility];  Service: Gynecology;  Laterality: N/A;   skin cancer removal  03/10/2021   forehead   Patient Active Problem List   Diagnosis Date Noted   Hx of migraines 06/27/2020   Mild exercise-induced asthma 01/23/2020   BMI 25.0-25.9,adult 01/23/2020   Hyperlipidemia LDL goal <100  01/23/2020   History of gestational diabetes 01/23/2020   History of squamous cell carcinoma excision 01/23/2020   History of dysplastic nevus 01/23/2020   Ganglion of wrist 01/22/2020   Endometriosis 10/22/2015   Infertility, female, secondary 10/22/2015   Cyst of nipple 10/22/2015    PCP: Randel Pigg, MD (thru employer)  REFERRING PROVIDER: Rubie Maid, MD  REFERRING DIAG: M62.08 (ICD-10-CM) - Diastasis of rectus abdominis  THERAPY DIAG:  Muscle weakness (generalized)  Other lack of coordination  Other low back pain  Rationale for Evaluation and Treatment: Rehabilitation  ONSET DATE: A few years  PRECAUTIONS: None  WEIGHT BEARING RESTRICTIONS: No  FALLS:  Has patient fallen in last 6 months? 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 Pt has pretty regular low back pain but worsened after having children. The back pain did not necessarily get worse with the second child but it can be constant at times. Children are 53 y.o and 34 months old. Pt does have a hx of doing crunches before the 1st child but has learned from information online to avoid motions such as trunk flexion. Pt is not breastfeeding anymore. Pt is also seeing a chiropractor for low back pain and some days has no pain at all. She has tried doing some exercises she found online, supine with toe taps, and did feel sore the next day as if she did work her abdominals.     LIVING ENVIRONMENT: Lives with: lives with their family and lives with their spouse Lives in: House/apartment   PATIENT GOALS: I  want to get rid of the diastasis and be able to feel comfortable wearing clothes and in my body.     SUBJECTIVE:  Pt was able to perform HEP during vacation. Pt did notice that she did not have back pain while walking on the beach or sitting for long periods of time which is very different from before coming to therapy.    PAIN:  Are you having pain? Yes NPRS scale: 2/10     OBJECTIVE:    COGNITION: (05/12/22) Overall cognitive status: Within functional limits for tasks assessed     POSTURE: (05/12/22) Grossly WNL, in sitting crossing of legs and occasional B plantarflexion   Iliac crest height: equal Pelvic obliquity: equal    LUMBAR AROM/PROM:  AROM (Normal range in degrees) AROM  05/12/22  Flexion (65) WNL  Extension (25-30) WNL *  Right lateral flexion (25) WNL  Left lateral flexion (25) WNL  Right rotation (30) WNL  Left rotation (30) WNL   (*= pain, Blank rows = not tested)  LOWER EXTREMITY ROM:    ROM (Normal range in degrees) Right 05/19/22 Left 05/19/22  Hip flexion (0-125) WNL WNL  Hip extension (0-15)    Hip abduction (0-40) WNL WNL  Hip adduction    Hip internal rotation (0-45) WNL* WNL*  Hip external rotation (0-45) WNL WNL  Knee flexion WNL WNL  Knee extension    Ankle dorsiflexion    Ankle plantarflexion    Ankle inversion    Ankle eversion     (*= pain, Blank rows = not tested)  LOWER EXTREMITY MMT:  MMT Right 05/12/22 Left 05/12/22  Hip flexion 5 5  Hip extension 5 5  Hip abduction    Hip adduction    Hip internal rotation 5 5  Hip external rotation 5 5  Knee flexion 5 5  Knee extension 5 5  Ankle dorsiflexion (in sitting) 5 5  Ankle plantarflexion    Ankle inversion    Ankle eversion    (*= pain, Blank rows = not tested)   PHYSICAL PERFORMANCE MEASURES:  RLE SLS: PSIS mobility WNL  LLE SLS: PSIS mobility WNL    PALPATION: Abdominal:  Diastasis: 3 finger above umbilicus with depth to DIP, 2.5 fingers at umbilicus with  depth to  DIP and 2 fingers below umbilicus, observed breath holding and doming  Scar: present from c-sections, Pt is not concerned or having pain but will assess in future sessions   EXTERNAL PELVIC EXAM: Patient educated on the purpose of the pelvic exam and articulated understanding; patient consented to the exam verbally. (05/19/22) Breath coordination: present but inconsistent Voluntary Contraction: 2/5 MMT Relaxation: full Perineal movement with sustained IAP increase ("bear down"): no change, gluteal activation Perineal movement with rapid IAP increase ("cough"): no change (0= no contraction, 1= flicker, 2= weak squeeze, 3= fair squeeze with lift, 4= good squeeze and lift against resistance, 5= strong squeeze against strong resistance)     TODAY'S TREATMENT:   Neuromuscular Re-education: Reassessment of DRA:  Diastasis: 2 fingers above umbilicus with decreased depth, 2.5 fingers at umbilicus and  0.5 finger below umbilicus, no doming observed   Supine dead bug modified with coordinated exhale for improved IAP management, VCs and TCs as needed for proper technique   Quadruped TrA activation, bird dog, with coordinated exhale for improved IAP management UE/LE challenge given with VCs and TCs to decrease forceful pull in of superficial abdominals  Wall squats with coordinated breath for improved IAP management   Lifting techniques (squat, deep squat, lunge squat) and floor transfers with coordinated breath for improved IAP management   Patient response to interventions:  Pt is comfortable moving to every other week   Patient Education:  Added intervention to HEP: bird dog, dead bug, wall squat, and lifting techniques. Patient educated throughout session on appropriate technique and form using multi-modal cueing, HEP, and activity modification. Patient will continue to benefit from further education in order to maximize compliance and understanding for long-term therapeutic gains.       ASSESSMENT:  Clinical Impression: Patient presents to clinic with excellent motivation to participate in today's session. Pt continues to demonstrates deficit in IAP management, PFM coordination, PFM strength, scar mobility, pain and posture. Pt with 2/10 LBP today with no known cause. Pt continues to demonstrate improved DRA as Pt now presents with 2 fingers above the umbilicus with depth, 2.5 fingers at umbilicus and 0.5 finger below umbilicus (change from 2 weeks ago where Pt has 2.5 fingers above umbilicus). Upon observation, Pt with decreased doming but Pt has noted some doming after she performs HEP on the floor. Today's sesion consisted of progression of deep core and coordinating breath with lifting techniques as she cares for her younger daughter and to decrease pressure placed at the abdomen when coming from the floor to stand. Pt required moderate VCs and TCs to reduce bodily compensations during TrA progression. Pt responded well to active and educational interventions. Patient will continue to benefit from skilled therapeutic intervention to address deficits in IAP management, PFM coordination, PFM strength, posture, scar mobility, and pain in order to increase PLOF and improve overall QOL.    Objective Impairments: decreased coordination, decreased endurance, decreased mobility, decreased strength, improper body mechanics, and pain.   Activity Limitations: carrying, lifting, bending, squatting, transfers, and caring for others  Personal Factors: Behavior pattern, Time since onset of injury/illness/exacerbation, and 1 comorbidity: HTN  are also affecting patient's functional outcome.   Rehab Potential: Good  Clinical Decision Making: Evolving/moderate complexity  Evaluation Complexity: Moderate   GOALS: Goals reviewed with patient? Yes  SHORT TERM GOALS: Target date: 08/10/2022  Patient will improve score on FOTO Low Back Pain by at least 5 points in order to demonstrate  decreased pain, improved IAP management improved and improved overall  QOL.  Baseline: (05/19/22)- 69 (target is 74), (7/25)- 83  Goal status: MET    LONG TERM GOALS: Target date: 09/21/2022 (08/04/22)   Pt will be able to coordinate activation of deep core musculature with diaphragmatic breathing while performing activities such as, but not limited to: lifting, bending, squatting, caring for young children, and physical activity in order to reduce worst pain by at least 2 points and participate in activities in the home and in the community.  Baseline: 6/10 and demonstrates increased breath holding Goal status: INITIAL  2.  Patient will be independent in the performance of a HEP in order to participate fully, without limitation or disruptions, in activities at home and in the community.  Baseline: TrA activation with DRAM compression Goal status: INITIAL  3.  Patient will be able to demonstrate improved abdominal strengthening with closure of the diastasis recti to 2 fingers or less in order to return to caring for young children and physical activity without limitation or distress.  Baseline: 3 fingers above/DIP depth, 2.5 at/DIP depth, 2 below, (8/9): 2 fingers above the umbilicus with depth, 2.5 fingers at umbilicus and 0.5 finger below umbilicus Goal status: IN PROGRESS    PLAN: PT Frequency: 1x/week  PT Duration: 12 weeks  Planned Interventions: Therapeutic exercises, Therapeutic activity, Neuromuscular re-education, Balance training, Gait training, Patient/Family education, Joint mobilization, Spinal mobilization, Moist heat, scar mobilization, Taping, and Manual therapy  Plan For Next Session: how was deep core? Challenge now with stability? PFM strength? Or back pain modulation techniques   [place], PT, DPT  06/29/2022, 11:07 AM

## 2022-07-13 ENCOUNTER — Ambulatory Visit: Payer: 59

## 2022-07-13 DIAGNOSIS — R278 Other lack of coordination: Secondary | ICD-10-CM

## 2022-07-13 DIAGNOSIS — M6281 Muscle weakness (generalized): Secondary | ICD-10-CM

## 2022-07-13 DIAGNOSIS — M5459 Other low back pain: Secondary | ICD-10-CM | POA: Diagnosis not present

## 2022-07-13 NOTE — Therapy (Signed)
OUTPATIENT PHYSICAL THERAPY FEMALE PELVIC TREATMENT   Patient Name: Diana Brooks MRN: [id number] DOB:24-Mar-1981, 41 y.o., female Today's Date: 07/13/2022   PT End of Session - 07/13/22 1053     Visit Number 8    Number of Visits 12    Date for PT Re-Evaluation 08/04/22    Authorization Type IE: 05/12/22    PT Start Time 1055    PT Stop Time 1135    PT Time Calculation (min) 40 min    Activity Tolerance Patient tolerated treatment well             Past Medical History:  Diagnosis Date   Anemia    Anxiety    Asthma    exercise induced   Complication of anesthesia    Dysmenorrhea    Endometriosis    Family history of adverse reaction to anesthesia    N/V from mother   GDM (gestational diabetes mellitus)    Hyperprolactinemia ([place])    IBS (irritable bowel syndrome)    Infertility, female    PONV (postoperative nausea and vomiting)    states phenergan helps alot post op n/v   Past Surgical History:  Procedure Laterality Date   CESAREAN SECTION  2013   CESAREAN SECTION N/A 12/24/2020   Procedure: CESAREAN SECTION;  Surgeon: Rubie Maid, MD;  Location: [facility];  Service: Obstetrics;  Laterality: N/A;  REPEAT CESAREAN SECTION   CHROMOPERTUBATION N/A 01/11/2016   Procedure: CHROMOPERTUBATION OF FALLOPIAN TUBES;  Surgeon: Brayton Mars, MD;  Location: [facility];  Service: Gynecology;  Laterality: N/A;   CHROMOPERTUBATION  2012   GANGLION CYST EXCISION Left    wrist   LAPAROSCOPY     lap with excision of fulguration of endometriosis   LAPAROSCOPY N/A 01/11/2016   Procedure: LAPAROSCOPY DIAGNOSTIC/ WITH EXCISION AND FULGERATION OF ENDOMETRIOSIS; MYOMECTOMY;  Surgeon: Brayton Mars, MD;  Location: [facility];  Service: Gynecology;  Laterality: N/A;   skin cancer removal  03/10/2021   forehead   Patient Active Problem List   Diagnosis Date Noted   Hx of migraines 06/27/2020   Mild exercise-induced asthma 01/23/2020   BMI 25.0-25.9,adult 01/23/2020    Hyperlipidemia LDL goal <100 01/23/2020   History of gestational diabetes 01/23/2020   History of squamous cell carcinoma excision 01/23/2020   History of dysplastic nevus 01/23/2020   Ganglion of wrist 01/22/2020   Endometriosis 10/22/2015   Infertility, female, secondary 10/22/2015   Cyst of nipple 10/22/2015    PCP: Randel Pigg, MD (thru employer)  REFERRING PROVIDER: Rubie Maid, MD  REFERRING DIAG: M62.08 (ICD-10-CM) - Diastasis of rectus abdominis  THERAPY DIAG:  Muscle weakness (generalized)  Other lack of coordination  Other low back pain  Rationale for Evaluation and Treatment: Rehabilitation  ONSET DATE: A few years  PRECAUTIONS: None  WEIGHT BEARING RESTRICTIONS: No  FALLS:  Has patient fallen in last 6 months? 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 Pt has pretty regular low back pain but worsened after having children. The back pain did not necessarily get worse with the second child but it can be constant at times. Children are 15 y.o and 15 months old. Pt does have a hx of doing crunches before the 1st child but has learned from information online to avoid motions such as trunk flexion. Pt is not breastfeeding anymore. Pt is also seeing a chiropractor for low back pain and some days has no pain at all. She has tried doing some exercises she found online, supine with toe taps, and did feel sore the next day as if she did work her abdominals.     LIVING ENVIRONMENT: Lives with: lives with their family and lives with their spouse Lives in:  House/apartment   PATIENT GOALS: I want to get rid of the diastasis and be able to feel comfortable wearing clothes and in my body.     SUBJECTIVE:  Pt has been doing well. Pt has noticed increased wrist pain on the R wrist especially with the bird dog. Pt has not fallen or nothing has fallen on her wrist. Pt notes pain when she gets up from a chair but is intermittent. Pt does not have pain when lifting the baby.    PAIN:  Are you having pain? Yes NPRS scale: 3/10 in R wrist     OBJECTIVE:    COGNITION: (05/12/22) Overall cognitive status: Within functional limits for tasks assessed     POSTURE: (05/12/22) Grossly WNL, in sitting crossing of legs and occasional B plantarflexion   Iliac crest height: equal Pelvic obliquity: equal    LUMBAR AROM/PROM:  AROM (Normal range in degrees) AROM  05/12/22  Flexion (65) WNL  Extension (25-30) WNL *  Right lateral flexion (25) WNL  Left lateral flexion (25) WNL  Right rotation (30) WNL  Left rotation (30) WNL   (*= pain, Blank rows = not tested)  LOWER EXTREMITY ROM:    ROM (Normal range in degrees) Right 05/19/22 Left 05/19/22  Hip flexion (0-125) WNL WNL  Hip extension (0-15)    Hip abduction (0-40) WNL WNL  Hip adduction    Hip internal rotation (0-45) WNL* WNL*  Hip external rotation (0-45) WNL WNL  Knee flexion WNL WNL  Knee extension    Ankle dorsiflexion    Ankle plantarflexion    Ankle inversion    Ankle eversion     (*= pain, Blank rows = not tested)  LOWER EXTREMITY MMT:  MMT Right 05/12/22 Left 05/12/22  Hip flexion 5 5  Hip extension 5 5  Hip abduction    Hip adduction    Hip internal rotation 5 5  Hip external rotation 5 5  Knee flexion 5 5  Knee extension 5 5  Ankle dorsiflexion (in sitting) 5 5  Ankle plantarflexion    Ankle inversion    Ankle eversion    (*= pain, Blank rows = not tested)   PHYSICAL PERFORMANCE MEASURES:  RLE SLS: PSIS mobility WNL  LLE SLS: PSIS mobility  WNL    PALPATION: Abdominal:  Diastasis: 3 finger above umbilicus with depth to DIP, 2.5 fingers at umbilicus with depth to DIP and 2 fingers below umbilicus, observed breath holding and doming  Scar: present from c-sections, Pt is not concerned or having pain but will assess in future sessions   EXTERNAL PELVIC EXAM: Patient educated on the purpose of the pelvic exam and articulated understanding; patient consented to the exam verbally. (05/19/22) Breath coordination: present but inconsistent Voluntary Contraction: 2/5 MMT Relaxation: full Perineal movement with sustained IAP increase ("bear down"): no change, gluteal activation Perineal movement with rapid IAP increase ("cough"): no change (0= no contraction, 1= flicker, 2= weak squeeze, 3= fair squeeze with lift, 4= good squeeze and lift against resistance, 5= strong squeeze against strong resistance)     TODAY'S TREATMENT:   Neuromuscular Re-education: Discussion on eliminating bird dog, for now, from HEP as Pt noticed increased wrist pain.   Discussion on resistance bands (GTB vs BTB) and sets/reps in order to increase challenge to improve postural stability and IAP management.   Standing pallof press, BTB, Bx10 with coordinated breath for improved IAP management, VCs and TCs as needed Added thoracic rotation for increased challenge with GTB   Lunge on Airex, pallof press with GTB, B x10 for improved IAP management and postural stability  Discussion on using pillows or towels to mimic Airex   Modified plank with increased height of treatment bed to about hip height, x15-20 secs, VCs and TCs for proper posture to improve IAP management     Patient response to interventions:  Pt is comfortable to return in 3 weeks and will decide if that will be discharge or require a few more sessions    Patient Education:  Added intervention to HEP: pallof press with rotation, lunge on airex, modified plank. Patient educated throughout  session on appropriate technique and form using multi-modal cueing, HEP, and activity modification. Patient will continue to benefit from further education in order to maximize compliance and understanding for long-term therapeutic gains.      ASSESSMENT:  Clinical Impression: Patient presents to clinic with excellent motivation to participate in today's session. Pt continues to demonstrates deficit in IAP management, PFM coordination, PFM strength, scar mobility, pain and posture. Pt continues to demonstrate improved IAP management with HEP and postural stability progression. Pt did report R wrist pain that is intermittent but noticed more when doing quadruped alternating UE/LE lifts. DPT removed from HEP and will assess wrist pain next session. Pt comfortable to return in 3 weeks for review of LTGs and STGs as well as possible discharge. Pt required moderate VCs and TCs to reduce bodily compensations during TrA progression. Pt responded well to active and educational interventions. Patient will continue to benefit from skilled therapeutic intervention to address deficits in IAP management, PFM coordination, PFM strength, posture, scar mobility, and pain in order to increase PLOF and improve overall QOL.    Objective Impairments: decreased coordination, decreased endurance, decreased mobility, decreased strength, improper body mechanics, and pain.   Activity Limitations: carrying, lifting, bending, squatting, transfers, and caring for others  Personal Factors: Behavior pattern, Time since onset of injury/illness/exacerbation, and 1 comorbidity: HTN  are also affecting patient's functional outcome.   Rehab Potential: Good  Clinical Decision Making: Evolving/moderate complexity  Evaluation Complexity: Moderate   GOALS: Goals reviewed with patient? Yes  SHORT TERM GOALS: Target date: 08/24/2022  Patient will improve score on FOTO Low Back Pain by at least 5 points in order to demonstrate  decreased pain, improved IAP  management improved and improved overall QOL.  Baseline: (05/19/22)- 69 (target is 74), (7/25)- 83  Goal status: MET    LONG TERM GOALS: Target date: 10/05/2022 (08/04/22)   Pt will be able to coordinate activation of deep core musculature with diaphragmatic breathing while performing activities such as, but not limited to: lifting, bending, squatting, caring for young children, and physical activity in order to reduce worst pain by at least 2 points and participate in activities in the home and in the community.  Baseline: 6/10 and demonstrates increased breath holding Goal status: INITIAL  2.  Patient will be independent in the performance of a HEP in order to participate fully, without limitation or disruptions, in activities at home and in the community.  Baseline: TrA activation with DRAM compression Goal status: INITIAL  3.  Patient will be able to demonstrate improved abdominal strengthening with closure of the diastasis recti to 2 fingers or less in order to return to caring for young children and physical activity without limitation or distress.  Baseline: 3 fingers above/DIP depth, 2.5 at/DIP depth, 2 below, (8/9): 2 fingers above the umbilicus with depth, 2.5 fingers at umbilicus and 0.5 finger below umbilicus Goal status: IN PROGRESS    PLAN: PT Frequency: 1x/week  PT Duration: 12 weeks  Planned Interventions: Therapeutic exercises, Therapeutic activity, Neuromuscular re-education, Balance training, Gait training, Patient/Family education, Joint mobilization, Spinal mobilization, Moist heat, scar mobilization, Taping, and Manual therapy  Plan For Next Session: Discharge? Review goals, [employer], PT, DPT  07/13/2022, 10:54 AM

## 2022-07-14 DIAGNOSIS — H5203 Hypermetropia, bilateral: Secondary | ICD-10-CM | POA: Diagnosis not present

## 2022-08-04 ENCOUNTER — Ambulatory Visit: Payer: 59 | Attending: Obstetrics and Gynecology

## 2022-08-04 DIAGNOSIS — M6281 Muscle weakness (generalized): Secondary | ICD-10-CM | POA: Insufficient documentation

## 2022-08-04 DIAGNOSIS — R278 Other lack of coordination: Secondary | ICD-10-CM | POA: Insufficient documentation

## 2022-08-04 DIAGNOSIS — M5459 Other low back pain: Secondary | ICD-10-CM | POA: Insufficient documentation

## 2022-08-04 NOTE — Therapy (Signed)
OUTPATIENT PHYSICAL THERAPY FEMALE PELVIC TREATMENT/DISCHARGE   Patient Name: Diana Brooks MRN: [id number] DOB:[birth year], 41 y.o., female Today's Date: 08/04/2022   PT End of Session - 08/04/22 1617     Visit Number 9    Number of Visits 12    Date for PT Re-Evaluation 08/04/22    Authorization Type IE: 05/12/22    PT Start Time 1615    PT Stop Time 1645    PT Time Calculation (min) 30 min    Activity Tolerance Patient tolerated treatment well             Past Medical History:  Diagnosis Date   Anemia    Anxiety    Asthma    exercise induced   Complication of anesthesia    Dysmenorrhea    Endometriosis    Family history of adverse reaction to anesthesia    N/V from mother   GDM (gestational diabetes mellitus)    Hyperprolactinemia ([place])    IBS (irritable bowel syndrome)    Infertility, female    PONV (postoperative nausea and vomiting)    states phenergan helps alot post op n/v   Past Surgical History:  Procedure Laterality Date   CESAREAN SECTION  2013   CESAREAN SECTION N/A 12/24/2020   Procedure: CESAREAN SECTION;  Surgeon: Rubie Maid, MD;  Location: [facility];  Service: Obstetrics;  Laterality: N/A;  REPEAT CESAREAN SECTION   CHROMOPERTUBATION N/A 01/11/2016   Procedure: CHROMOPERTUBATION OF FALLOPIAN TUBES;  Surgeon: Brayton Mars, MD;  Location: [facility];  Service: Gynecology;  Laterality: N/A;   CHROMOPERTUBATION  2012   GANGLION CYST EXCISION Left    wrist   LAPAROSCOPY     lap with excision of fulguration of endometriosis   LAPAROSCOPY N/A 01/11/2016   Procedure: LAPAROSCOPY DIAGNOSTIC/ WITH EXCISION AND FULGERATION OF ENDOMETRIOSIS; MYOMECTOMY;  Surgeon: Brayton Mars, MD;  Location: [facility];  Service: Gynecology;  Laterality: N/A;   skin cancer removal  03/10/2021   forehead   Patient Active Problem List   Diagnosis Date Noted   Hx of migraines 06/27/2020   Mild exercise-induced asthma 01/23/2020   BMI 25.0-25.9,adult  01/23/2020   Hyperlipidemia LDL goal <100 01/23/2020   History of gestational diabetes 01/23/2020   History of squamous cell carcinoma excision 01/23/2020   History of dysplastic nevus 01/23/2020   Ganglion of wrist 01/22/2020   Endometriosis 10/22/2015   Infertility, female, secondary 10/22/2015   Cyst of nipple 10/22/2015    PCP: Randel Pigg, MD (thru employer)  REFERRING PROVIDER: Rubie Maid, MD  REFERRING DIAG: M62.08 (ICD-10-CM) - Diastasis of rectus abdominis  THERAPY DIAG:  Muscle weakness (generalized)  Other lack of coordination  Other low back pain  Rationale for Evaluation and Treatment: Rehabilitation  ONSET DATE: A few years  PRECAUTIONS: None  WEIGHT BEARING RESTRICTIONS: No  FALLS:  Has patient fallen in last 6 months? 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 Pt has pretty regular low back pain but worsened after having children. The back pain did not necessarily get worse with the second child but it can be constant at times. Children are 87 y.o and 23 months old. Pt does have a hx of doing crunches before the 1st child but has learned from information online to avoid motions such as trunk flexion. Pt is not breastfeeding anymore. Pt is also seeing a chiropractor for low back pain and some days has no pain at all. She has tried doing some exercises she found online, supine with toe taps, and did feel sore the next day as if she did work her abdominals.     LIVING ENVIRONMENT: Lives with: lives with their family and lives with their spouse Lives  in: House/apartment   PATIENT GOALS: I want to get rid of the diastasis and be able to feel comfortable wearing clothes and in my body.     SUBJECTIVE:  Pt has been doing well. Pt has noticed increased wrist pain on the R wrist especially with the bird dog. Pt has not fallen or nothing has fallen on her wrist. Pt notes pain when she gets up from a chair but is intermittent. Pt does not have pain when lifting the baby.    PAIN:  Are you having pain? Yes NPRS scale: 3/10 in R wrist     OBJECTIVE:    COGNITION: (05/12/22) Overall cognitive status: Within functional limits for tasks assessed     POSTURE: (05/12/22) Grossly WNL, in sitting crossing of legs and occasional B plantarflexion   Iliac crest height: equal Pelvic obliquity: equal    LUMBAR AROM/PROM:  AROM (Normal range in degrees) AROM  05/12/22  Flexion (65) WNL  Extension (25-30) WNL *  Right lateral flexion (25) WNL  Left lateral flexion (25) WNL  Right rotation (30) WNL  Left rotation (30) WNL   (*= pain, Blank rows = not tested)  LOWER EXTREMITY ROM:    ROM (Normal range in degrees) Right 05/19/22 Left 05/19/22  Hip flexion (0-125) WNL WNL  Hip extension (0-15)    Hip abduction (0-40) WNL WNL  Hip adduction    Hip internal rotation (0-45) WNL* WNL*  Hip external rotation (0-45) WNL WNL  Knee flexion WNL WNL  Knee extension    Ankle dorsiflexion    Ankle plantarflexion    Ankle inversion    Ankle eversion     (*= pain, Blank rows = not tested)  LOWER EXTREMITY MMT:  MMT Right 05/12/22 Left 05/12/22  Hip flexion 5 5  Hip extension 5 5  Hip abduction    Hip adduction    Hip internal rotation 5 5  Hip external rotation 5 5  Knee flexion 5 5  Knee extension 5 5  Ankle dorsiflexion (in sitting) 5 5  Ankle plantarflexion    Ankle inversion    Ankle eversion    (*= pain, Blank rows = not tested)   PHYSICAL PERFORMANCE MEASURES:  RLE SLS: PSIS mobility WNL  LLE SLS: PSIS mobility  WNL    PALPATION: Abdominal:  Diastasis: 3 finger above umbilicus with depth to DIP, 2.5 fingers at umbilicus with depth to DIP and 2 fingers below umbilicus, observed breath holding and doming  Scar: present from c-sections, Pt is not concerned or having pain but will assess in future sessions   EXTERNAL PELVIC EXAM: Patient educated on the purpose of the pelvic exam and articulated understanding; patient consented to the exam verbally. (05/19/22) Breath coordination: present but inconsistent Voluntary Contraction: 2/5 MMT Relaxation: full Perineal movement with sustained IAP increase ("bear down"): no change, gluteal activation Perineal movement with rapid IAP increase ("cough"): no change (0= no contraction, 1= flicker, 2= weak squeeze, 3= fair squeeze with lift, 4= good squeeze and lift against resistance, 5= strong squeeze against strong resistance)     TODAY'S TREATMENT:   Neuromuscular Re-education: Review of HEP: Deep core activation in various positions PFM lengthening and lower back pain modulation techniques  Provided new HEP: cat/cow, pelvic tilts, and bridging for maintaining postural stability, tissue extensibility, and pain modulation  Discussion on exhaling with movement and postures to avoid (forward flexion "crunches") to decrease likelihood of another DRA.     Patient Education:  Added intervention to HEP: PFM lengthening techniques. Patient educated throughout session on appropriate technique and form using multi-modal cueing, HEP, and activity modification. Patient will continue to benefit from further education in order to maximize compliance and understanding for long-term therapeutic gains.      ASSESSMENT:  Clinical Impression: Patient presents to clinic with excellent motivation to participate in today's session as part of discharge. Pt demonstrates significant improvement from IE to discharge in relation to [place]. At IE, Pt with presented with  significant lower back pain with worst being 6/10 and constant. Pt now has worst back pain of 3/10 and reports it is more correlated with increased stress levels. However, it is not constant. Pt completed FOTO Lumbar Spine goal of >/= 74 (06/14/22-83) compared to IE of 69. On 05/12/22, Pt presented with DRA: 3 fingers above/DIP depth, 2.5 at/DIP depth, 2 below with noticeable doming. Pt now has no DRA: 2 fingers above the umbilicus with improved depth, 1.5 at umbilicus, and 0.5 finger below umbilicus with slight doming observed with coughing (increased IAP). Reviewed HEP and given supine bridges, pelvic tilts, and cat/cow as pain modulators and deep core activation (bridges). As Pt has met all LTGs and STGs below and IND with HEP, Pt is comfortable to discharge from PFPT and given information to contact office if need for further assistance in the future.    Objective Impairments: decreased coordination, decreased endurance, decreased mobility, decreased strength, improper body mechanics, and pain.   Activity Limitations: carrying, lifting, bending, squatting, transfers, and caring for others  Personal Factors: Behavior pattern, Time since onset of injury/illness/exacerbation, and 1 comorbidity: HTN  are also affecting patient's functional outcome.   Rehab Potential: Good  Clinical Decision Making: Evolving/moderate complexity  Evaluation Complexity: Moderate   GOALS: Goals reviewed with patient? Yes  SHORT TERM GOALS: Target date: 08/04/22  Patient will improve score on FOTO Low Back Pain by at least 5 points in order to demonstrate decreased pain, improved IAP management improved and improved overall QOL.  Baseline: (05/19/22)- 69 (target is 74), (7/25)- 83  Goal status: MET    LONG TERM GOALS: Target date: 08/04/22   Pt will be able to coordinate activation of deep core musculature with diaphragmatic breathing while performing activities such as, but not limited to: lifting, bending,  squatting, caring for young children, and physical activity  in order to reduce worst pain by at least 2 points and participate in activities in the home and in the community.  Baseline: 6/10 and demonstrates increased breath holding; (9/14): 3/10 Goal status: MET  2.  Patient will be independent in the performance of a HEP in order to participate fully, without limitation or disruptions, in activities at home and in the community.  Baseline: TrA activation with DRAM compression; (9/14): Pt is IND with HEP Goal status: MET  3.  Patient will be able to demonstrate improved abdominal strengthening with closure of the diastasis recti to 2 fingers or less in order to return to caring for young children and physical activity without limitation or distress.  Baseline: 3 fingers above/DIP depth, 2.5 at/DIP depth, 2 below, (8/9): 2 fingers above the umbilicus with depth, 2.5 fingers at umbilicus and 0.5 finger below umbilicus; (5/95): 2 fingers above the umbilicus with improved depth, 1.5 at umbilicus, and 0.5 finger below umbilicus  Goal status: MET    PLAN: PT Frequency: 1x/week  PT Duration: 12 weeks  Planned Interventions: Therapeutic exercises, Therapeutic activity, Neuromuscular re-education, Balance training, Gait training, Patient/Family education, Joint mobilization, Spinal mobilization, Moist heat, scar mobilization, Taping, and Manual therapy   Brady Plant, PT, DPT  08/04/2022, 5:00 PM

## 2022-08-10 IMAGING — DX DG CHEST 2V
2 series · 2 of 2 positions shown · non-contrast
Comparison: 11/07/2012

CLINICAL DATA: Had [id number] 3 weeks ago, productive cough with
green mucus, runny nose, sinus pressure

EXAM:
CHEST - 2 VIEW

[chest pa]
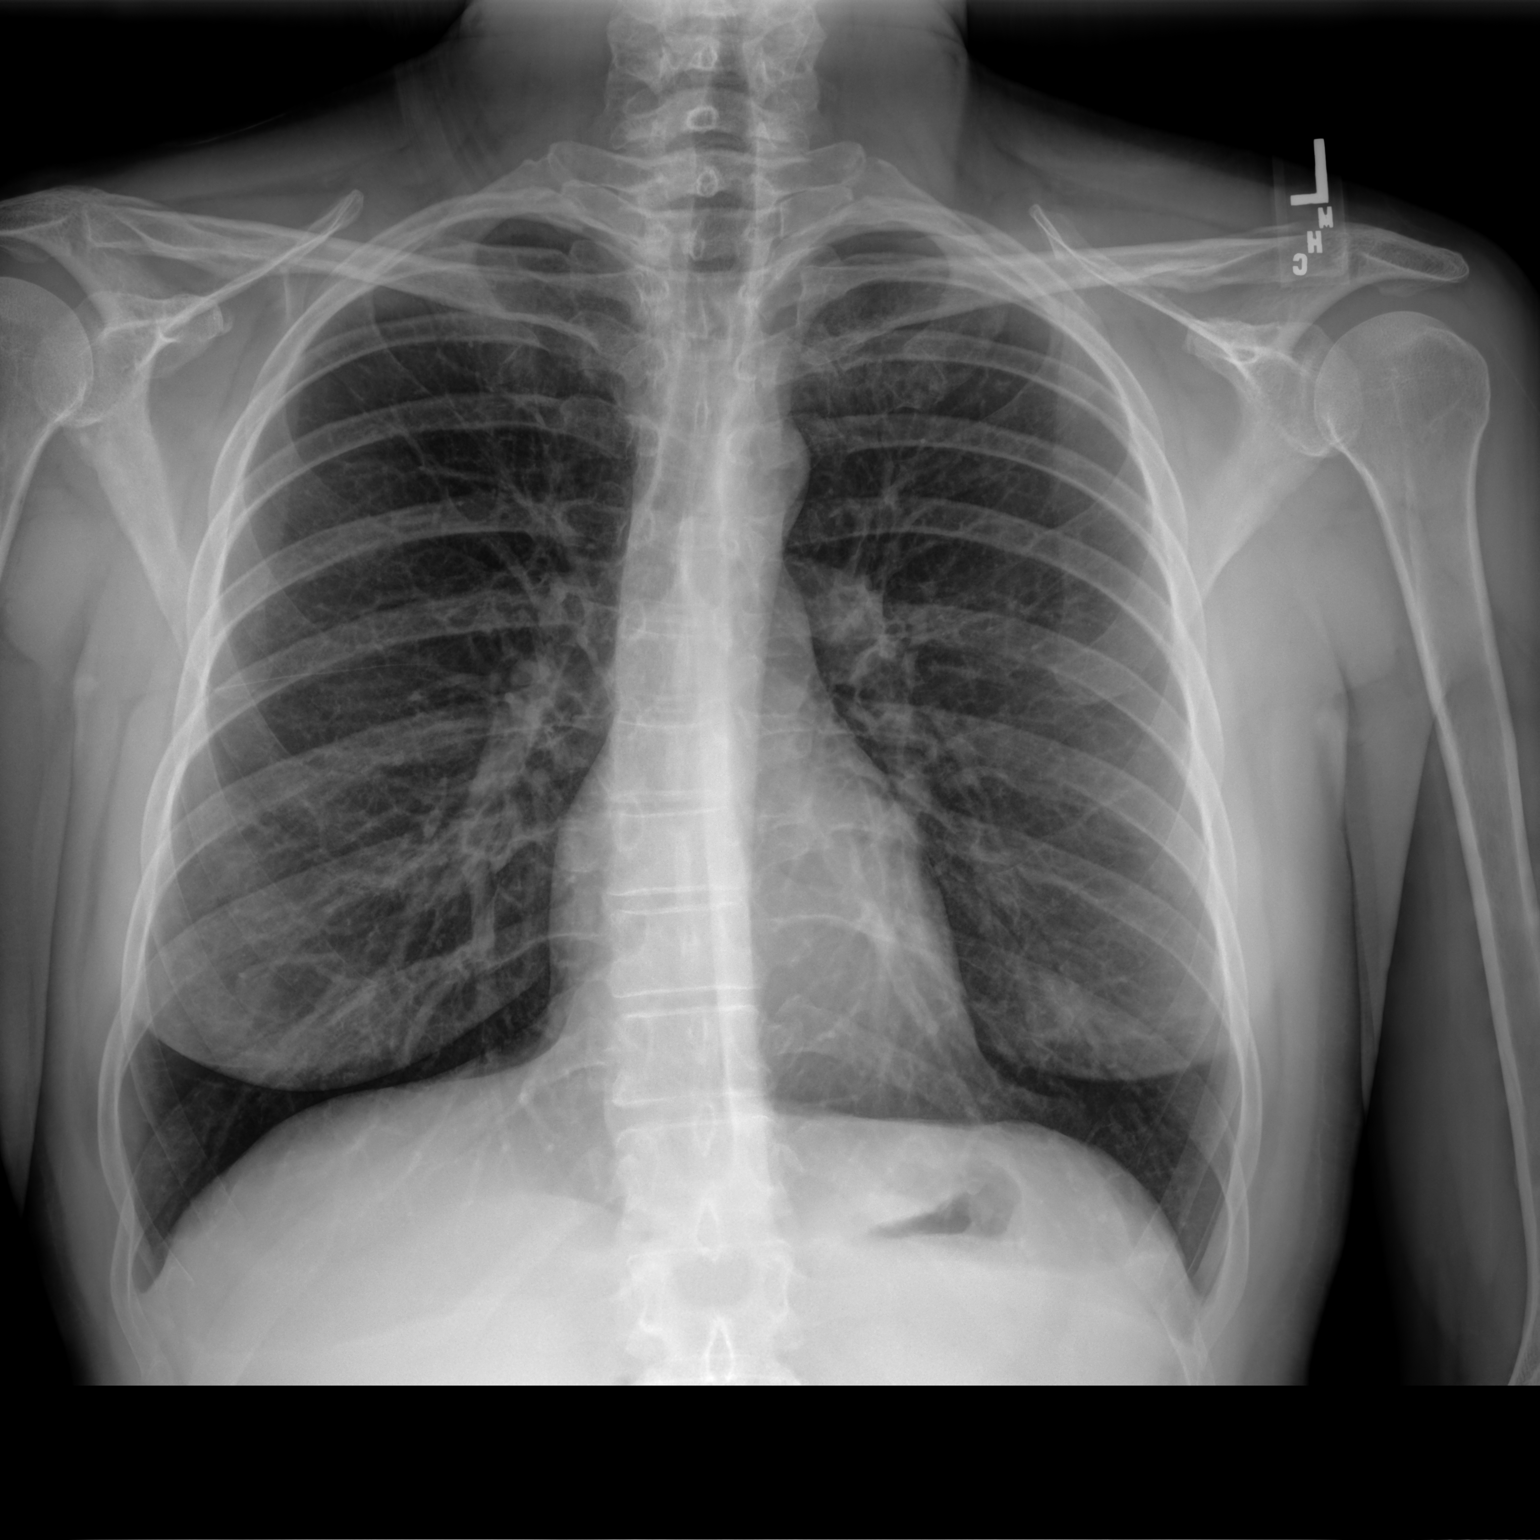

[chest lat]
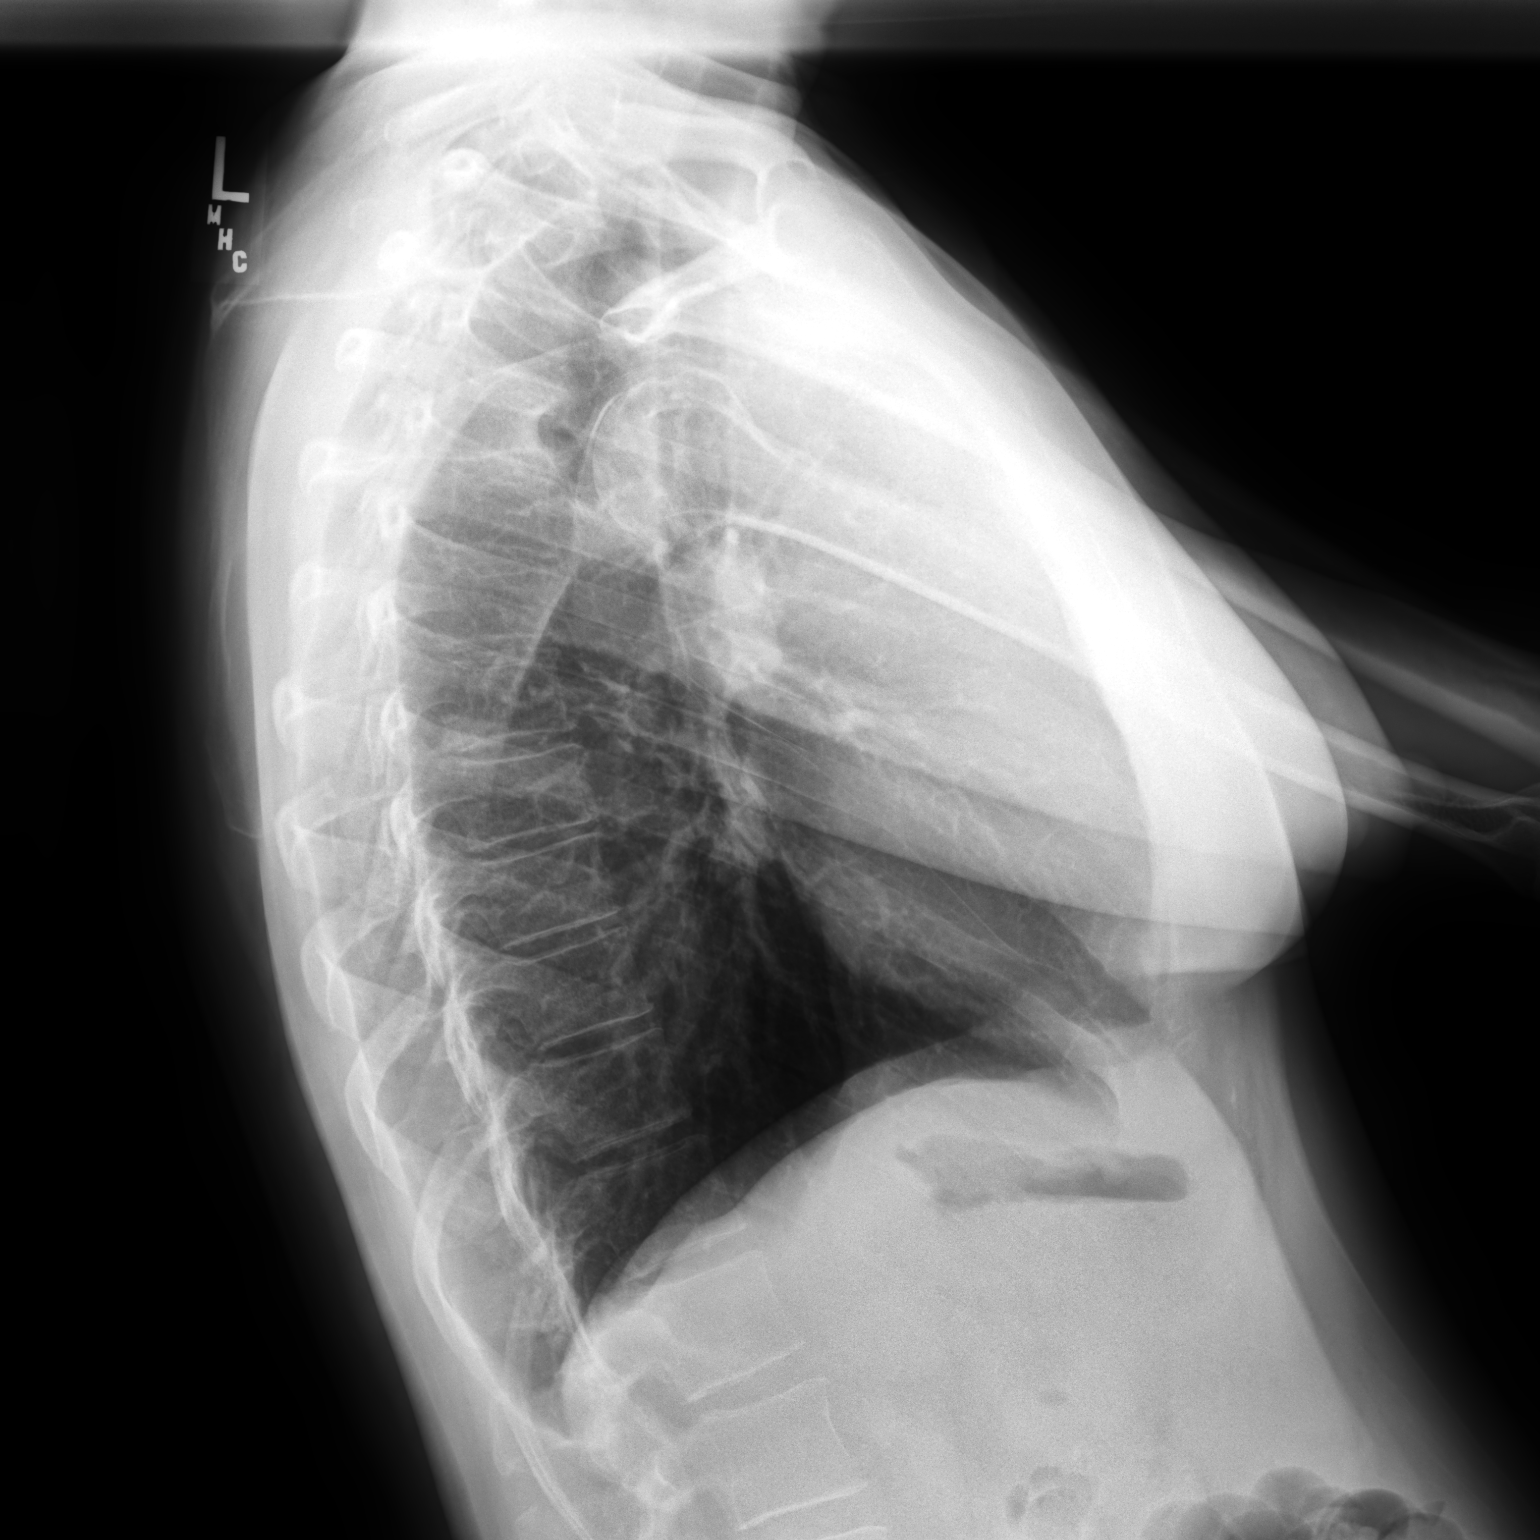

[2 of 2 positions shown; findings below may reference images not displayed]

FINDINGS: Normal heart size, mediastinal contours, and pulmonary vascularity.

Lungs clear.

No pleural effusion or pneumothorax.

Bones unremarkable.
IMPRESSION: Normal exam.

## 2022-08-18 ENCOUNTER — Ambulatory Visit: Payer: 59

## 2022-08-18 DIAGNOSIS — M9903 Segmental and somatic dysfunction of lumbar region: Secondary | ICD-10-CM | POA: Diagnosis not present

## 2022-08-18 DIAGNOSIS — M9901 Segmental and somatic dysfunction of cervical region: Secondary | ICD-10-CM | POA: Diagnosis not present

## 2022-08-18 DIAGNOSIS — M9902 Segmental and somatic dysfunction of thoracic region: Secondary | ICD-10-CM | POA: Diagnosis not present

## 2022-08-18 DIAGNOSIS — M5387 Other specified dorsopathies, lumbosacral region: Secondary | ICD-10-CM | POA: Diagnosis not present

## 2022-08-18 DIAGNOSIS — M9904 Segmental and somatic dysfunction of sacral region: Secondary | ICD-10-CM | POA: Diagnosis not present

## 2022-08-18 DIAGNOSIS — M4603 Spinal enthesopathy, cervicothoracic region: Secondary | ICD-10-CM | POA: Diagnosis not present

## 2022-09-13 DIAGNOSIS — M9902 Segmental and somatic dysfunction of thoracic region: Secondary | ICD-10-CM | POA: Diagnosis not present

## 2022-09-13 DIAGNOSIS — M9904 Segmental and somatic dysfunction of sacral region: Secondary | ICD-10-CM | POA: Diagnosis not present

## 2022-09-13 DIAGNOSIS — M4603 Spinal enthesopathy, cervicothoracic region: Secondary | ICD-10-CM | POA: Diagnosis not present

## 2022-09-13 DIAGNOSIS — M5387 Other specified dorsopathies, lumbosacral region: Secondary | ICD-10-CM | POA: Diagnosis not present

## 2022-09-13 DIAGNOSIS — M9901 Segmental and somatic dysfunction of cervical region: Secondary | ICD-10-CM | POA: Diagnosis not present

## 2022-09-13 DIAGNOSIS — M9903 Segmental and somatic dysfunction of lumbar region: Secondary | ICD-10-CM | POA: Diagnosis not present

## 2022-10-07 DIAGNOSIS — D225 Melanocytic nevi of trunk: Secondary | ICD-10-CM | POA: Diagnosis not present

## 2022-10-07 DIAGNOSIS — L821 Other seborrheic keratosis: Secondary | ICD-10-CM | POA: Diagnosis not present

## 2022-10-07 DIAGNOSIS — D2262 Melanocytic nevi of left upper limb, including shoulder: Secondary | ICD-10-CM | POA: Diagnosis not present

## 2022-10-07 DIAGNOSIS — D2272 Melanocytic nevi of left lower limb, including hip: Secondary | ICD-10-CM | POA: Diagnosis not present

## 2022-10-07 DIAGNOSIS — D2261 Melanocytic nevi of right upper limb, including shoulder: Secondary | ICD-10-CM | POA: Diagnosis not present

## 2022-10-07 DIAGNOSIS — L814 Other melanin hyperpigmentation: Secondary | ICD-10-CM | POA: Diagnosis not present

## 2022-10-07 DIAGNOSIS — D2271 Melanocytic nevi of right lower limb, including hip: Secondary | ICD-10-CM | POA: Diagnosis not present

## 2022-10-07 DIAGNOSIS — L308 Other specified dermatitis: Secondary | ICD-10-CM | POA: Diagnosis not present

## 2022-10-19 DIAGNOSIS — M4603 Spinal enthesopathy, cervicothoracic region: Secondary | ICD-10-CM | POA: Diagnosis not present

## 2022-10-19 DIAGNOSIS — M9904 Segmental and somatic dysfunction of sacral region: Secondary | ICD-10-CM | POA: Diagnosis not present

## 2022-10-19 DIAGNOSIS — M9902 Segmental and somatic dysfunction of thoracic region: Secondary | ICD-10-CM | POA: Diagnosis not present

## 2022-10-19 DIAGNOSIS — M9901 Segmental and somatic dysfunction of cervical region: Secondary | ICD-10-CM | POA: Diagnosis not present

## 2022-10-19 DIAGNOSIS — M9903 Segmental and somatic dysfunction of lumbar region: Secondary | ICD-10-CM | POA: Diagnosis not present

## 2022-10-19 DIAGNOSIS — M5387 Other specified dorsopathies, lumbosacral region: Secondary | ICD-10-CM | POA: Diagnosis not present

## 2022-10-31 ENCOUNTER — Encounter: Payer: Self-pay | Admitting: Obstetrics and Gynecology

## 2022-10-31 MED ORDER — GLUCOSE BLOOD VI STRP: ORAL_STRIP | 12 refills | Status: AC

## 2022-11-01 ENCOUNTER — Telehealth: Payer: Self-pay

## 2022-11-01 NOTE — Telephone Encounter (Signed)
Christine from [place] calling to see what the freq is for pt to ck her blood sugars.  Adv QID.

## 2022-11-02 DIAGNOSIS — Z6824 Body mass index (BMI) 24.0-24.9, adult: Secondary | ICD-10-CM | POA: Diagnosis not present

## 2022-11-02 DIAGNOSIS — J209 Acute bronchitis, unspecified: Secondary | ICD-10-CM | POA: Diagnosis not present

## 2022-11-02 DIAGNOSIS — R051 Acute cough: Secondary | ICD-10-CM | POA: Diagnosis not present

## 2022-11-08 DIAGNOSIS — M9901 Segmental and somatic dysfunction of cervical region: Secondary | ICD-10-CM | POA: Diagnosis not present

## 2022-11-08 DIAGNOSIS — M4603 Spinal enthesopathy, cervicothoracic region: Secondary | ICD-10-CM | POA: Diagnosis not present

## 2022-11-08 DIAGNOSIS — M9904 Segmental and somatic dysfunction of sacral region: Secondary | ICD-10-CM | POA: Diagnosis not present

## 2022-11-08 DIAGNOSIS — M5387 Other specified dorsopathies, lumbosacral region: Secondary | ICD-10-CM | POA: Diagnosis not present

## 2022-11-08 DIAGNOSIS — M9903 Segmental and somatic dysfunction of lumbar region: Secondary | ICD-10-CM | POA: Diagnosis not present

## 2022-11-08 DIAGNOSIS — M9902 Segmental and somatic dysfunction of thoracic region: Secondary | ICD-10-CM | POA: Diagnosis not present

## 2022-11-28 DIAGNOSIS — M9902 Segmental and somatic dysfunction of thoracic region: Secondary | ICD-10-CM | POA: Diagnosis not present

## 2022-11-28 DIAGNOSIS — M4603 Spinal enthesopathy, cervicothoracic region: Secondary | ICD-10-CM | POA: Diagnosis not present

## 2022-11-28 DIAGNOSIS — M9901 Segmental and somatic dysfunction of cervical region: Secondary | ICD-10-CM | POA: Diagnosis not present

## 2022-11-28 DIAGNOSIS — M9904 Segmental and somatic dysfunction of sacral region: Secondary | ICD-10-CM | POA: Diagnosis not present

## 2022-11-28 DIAGNOSIS — M9903 Segmental and somatic dysfunction of lumbar region: Secondary | ICD-10-CM | POA: Diagnosis not present

## 2022-11-28 DIAGNOSIS — M5387 Other specified dorsopathies, lumbosacral region: Secondary | ICD-10-CM | POA: Diagnosis not present

## 2022-12-07 DIAGNOSIS — M5387 Other specified dorsopathies, lumbosacral region: Secondary | ICD-10-CM | POA: Diagnosis not present

## 2022-12-07 DIAGNOSIS — M9904 Segmental and somatic dysfunction of sacral region: Secondary | ICD-10-CM | POA: Diagnosis not present

## 2022-12-07 DIAGNOSIS — M9902 Segmental and somatic dysfunction of thoracic region: Secondary | ICD-10-CM | POA: Diagnosis not present

## 2022-12-07 DIAGNOSIS — M9903 Segmental and somatic dysfunction of lumbar region: Secondary | ICD-10-CM | POA: Diagnosis not present

## 2022-12-07 DIAGNOSIS — M4603 Spinal enthesopathy, cervicothoracic region: Secondary | ICD-10-CM | POA: Diagnosis not present

## 2022-12-07 DIAGNOSIS — M9901 Segmental and somatic dysfunction of cervical region: Secondary | ICD-10-CM | POA: Diagnosis not present

## 2023-01-04 DIAGNOSIS — M9901 Segmental and somatic dysfunction of cervical region: Secondary | ICD-10-CM | POA: Diagnosis not present

## 2023-01-04 DIAGNOSIS — M5387 Other specified dorsopathies, lumbosacral region: Secondary | ICD-10-CM | POA: Diagnosis not present

## 2023-01-04 DIAGNOSIS — M4603 Spinal enthesopathy, cervicothoracic region: Secondary | ICD-10-CM | POA: Diagnosis not present

## 2023-01-04 DIAGNOSIS — M9904 Segmental and somatic dysfunction of sacral region: Secondary | ICD-10-CM | POA: Diagnosis not present

## 2023-01-04 DIAGNOSIS — M9902 Segmental and somatic dysfunction of thoracic region: Secondary | ICD-10-CM | POA: Diagnosis not present

## 2023-01-04 DIAGNOSIS — M9903 Segmental and somatic dysfunction of lumbar region: Secondary | ICD-10-CM | POA: Diagnosis not present

## 2023-01-09 ENCOUNTER — Ambulatory Visit: Payer: Self-pay | Admitting: Physician Assistant

## 2023-01-09 DIAGNOSIS — H9202 Otalgia, left ear: Secondary | ICD-10-CM

## 2023-01-09 MED ORDER — PSEUDOEPHEDRINE HCL ER 120 MG PO TB12: 120.0000 mg | ORAL_TABLET | Freq: Two times a day (BID) | ORAL | 0 refills | Status: DC
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
## 2023-01-18 DIAGNOSIS — M5387 Other specified dorsopathies, lumbosacral region: Secondary | ICD-10-CM | POA: Diagnosis not present

## 2023-01-18 DIAGNOSIS — M9903 Segmental and somatic dysfunction of lumbar region: Secondary | ICD-10-CM | POA: Diagnosis not present

## 2023-01-18 DIAGNOSIS — M9902 Segmental and somatic dysfunction of thoracic region: Secondary | ICD-10-CM | POA: Diagnosis not present

## 2023-01-18 DIAGNOSIS — M9901 Segmental and somatic dysfunction of cervical region: Secondary | ICD-10-CM | POA: Diagnosis not present

## 2023-01-18 DIAGNOSIS — M4603 Spinal enthesopathy, cervicothoracic region: Secondary | ICD-10-CM | POA: Diagnosis not present

## 2023-01-18 DIAGNOSIS — M9904 Segmental and somatic dysfunction of sacral region: Secondary | ICD-10-CM | POA: Diagnosis not present

## 2023-02-02 DIAGNOSIS — M9901 Segmental and somatic dysfunction of cervical region: Secondary | ICD-10-CM | POA: Diagnosis not present

## 2023-02-02 DIAGNOSIS — M9903 Segmental and somatic dysfunction of lumbar region: Secondary | ICD-10-CM | POA: Diagnosis not present

## 2023-02-02 DIAGNOSIS — M5387 Other specified dorsopathies, lumbosacral region: Secondary | ICD-10-CM | POA: Diagnosis not present

## 2023-02-02 DIAGNOSIS — M9906 Segmental and somatic dysfunction of lower extremity: Secondary | ICD-10-CM | POA: Diagnosis not present

## 2023-02-02 DIAGNOSIS — M4603 Spinal enthesopathy, cervicothoracic region: Secondary | ICD-10-CM | POA: Diagnosis not present

## 2023-02-02 DIAGNOSIS — M9902 Segmental and somatic dysfunction of thoracic region: Secondary | ICD-10-CM | POA: Diagnosis not present

## 2023-02-02 DIAGNOSIS — M9904 Segmental and somatic dysfunction of sacral region: Secondary | ICD-10-CM | POA: Diagnosis not present

## 2023-02-16 DIAGNOSIS — M5387 Other specified dorsopathies, lumbosacral region: Secondary | ICD-10-CM | POA: Diagnosis not present

## 2023-02-16 DIAGNOSIS — M4603 Spinal enthesopathy, cervicothoracic region: Secondary | ICD-10-CM | POA: Diagnosis not present

## 2023-02-16 DIAGNOSIS — M9906 Segmental and somatic dysfunction of lower extremity: Secondary | ICD-10-CM | POA: Diagnosis not present

## 2023-02-16 DIAGNOSIS — M9901 Segmental and somatic dysfunction of cervical region: Secondary | ICD-10-CM | POA: Diagnosis not present

## 2023-02-16 DIAGNOSIS — M9904 Segmental and somatic dysfunction of sacral region: Secondary | ICD-10-CM | POA: Diagnosis not present

## 2023-02-16 DIAGNOSIS — M9902 Segmental and somatic dysfunction of thoracic region: Secondary | ICD-10-CM | POA: Diagnosis not present

## 2023-02-16 DIAGNOSIS — M9903 Segmental and somatic dysfunction of lumbar region: Secondary | ICD-10-CM | POA: Diagnosis not present

## 2023-03-01 DIAGNOSIS — M9904 Segmental and somatic dysfunction of sacral region: Secondary | ICD-10-CM | POA: Diagnosis not present

## 2023-03-01 DIAGNOSIS — M9902 Segmental and somatic dysfunction of thoracic region: Secondary | ICD-10-CM | POA: Diagnosis not present

## 2023-03-01 DIAGNOSIS — M9903 Segmental and somatic dysfunction of lumbar region: Secondary | ICD-10-CM | POA: Diagnosis not present

## 2023-03-01 DIAGNOSIS — M9901 Segmental and somatic dysfunction of cervical region: Secondary | ICD-10-CM | POA: Diagnosis not present

## 2023-03-01 DIAGNOSIS — M9906 Segmental and somatic dysfunction of lower extremity: Secondary | ICD-10-CM | POA: Diagnosis not present

## 2023-03-01 DIAGNOSIS — M4603 Spinal enthesopathy, cervicothoracic region: Secondary | ICD-10-CM | POA: Diagnosis not present

## 2023-03-01 DIAGNOSIS — M5387 Other specified dorsopathies, lumbosacral region: Secondary | ICD-10-CM | POA: Diagnosis not present

## 2023-03-03 ENCOUNTER — Encounter: Payer: 59 | Admitting: Obstetrics and Gynecology

## 2023-03-21 DIAGNOSIS — M9906 Segmental and somatic dysfunction of lower extremity: Secondary | ICD-10-CM | POA: Diagnosis not present

## 2023-03-21 DIAGNOSIS — M9901 Segmental and somatic dysfunction of cervical region: Secondary | ICD-10-CM | POA: Diagnosis not present

## 2023-03-21 DIAGNOSIS — M9904 Segmental and somatic dysfunction of sacral region: Secondary | ICD-10-CM | POA: Diagnosis not present

## 2023-03-21 DIAGNOSIS — M4603 Spinal enthesopathy, cervicothoracic region: Secondary | ICD-10-CM | POA: Diagnosis not present

## 2023-03-21 DIAGNOSIS — M5387 Other specified dorsopathies, lumbosacral region: Secondary | ICD-10-CM | POA: Diagnosis not present

## 2023-03-21 DIAGNOSIS — M9903 Segmental and somatic dysfunction of lumbar region: Secondary | ICD-10-CM | POA: Diagnosis not present

## 2023-03-21 DIAGNOSIS — M9902 Segmental and somatic dysfunction of thoracic region: Secondary | ICD-10-CM | POA: Diagnosis not present

## 2023-04-04 DIAGNOSIS — M9906 Segmental and somatic dysfunction of lower extremity: Secondary | ICD-10-CM | POA: Diagnosis not present

## 2023-04-04 DIAGNOSIS — M9902 Segmental and somatic dysfunction of thoracic region: Secondary | ICD-10-CM | POA: Diagnosis not present

## 2023-04-04 DIAGNOSIS — M9904 Segmental and somatic dysfunction of sacral region: Secondary | ICD-10-CM | POA: Diagnosis not present

## 2023-04-04 DIAGNOSIS — M4603 Spinal enthesopathy, cervicothoracic region: Secondary | ICD-10-CM | POA: Diagnosis not present

## 2023-04-04 DIAGNOSIS — M9903 Segmental and somatic dysfunction of lumbar region: Secondary | ICD-10-CM | POA: Diagnosis not present

## 2023-04-04 DIAGNOSIS — M5387 Other specified dorsopathies, lumbosacral region: Secondary | ICD-10-CM | POA: Diagnosis not present

## 2023-04-04 DIAGNOSIS — M9901 Segmental and somatic dysfunction of cervical region: Secondary | ICD-10-CM | POA: Diagnosis not present

## 2023-04-06 DIAGNOSIS — M5387 Other specified dorsopathies, lumbosacral region: Secondary | ICD-10-CM | POA: Diagnosis not present

## 2023-04-06 DIAGNOSIS — M9903 Segmental and somatic dysfunction of lumbar region: Secondary | ICD-10-CM | POA: Diagnosis not present

## 2023-04-06 DIAGNOSIS — M9902 Segmental and somatic dysfunction of thoracic region: Secondary | ICD-10-CM | POA: Diagnosis not present

## 2023-04-06 DIAGNOSIS — M4603 Spinal enthesopathy, cervicothoracic region: Secondary | ICD-10-CM | POA: Diagnosis not present

## 2023-04-06 DIAGNOSIS — M9901 Segmental and somatic dysfunction of cervical region: Secondary | ICD-10-CM | POA: Diagnosis not present

## 2023-04-06 DIAGNOSIS — M9906 Segmental and somatic dysfunction of lower extremity: Secondary | ICD-10-CM | POA: Diagnosis not present

## 2023-04-06 DIAGNOSIS — M9904 Segmental and somatic dysfunction of sacral region: Secondary | ICD-10-CM | POA: Diagnosis not present

## 2023-04-11 ENCOUNTER — Encounter: Payer: Self-pay | Admitting: Obstetrics and Gynecology

## 2023-04-11 ENCOUNTER — Other Ambulatory Visit (HOSPITAL_COMMUNITY)
Admission: RE | Admit: 2023-04-11 | Discharge: 2023-04-11 | Disposition: A | Discharge status: Home / Self Care | Payer: 59 | Source: Ambulatory Visit | Attending: Obstetrics and Gynecology | Admitting: Obstetrics and Gynecology

## 2023-04-11 ENCOUNTER — Ambulatory Visit (INDEPENDENT_AMBULATORY_CARE_PROVIDER_SITE_OTHER): Payer: 59 | Admitting: Obstetrics and Gynecology

## 2023-04-11 VITALS — BP 108/73 | HR 77 | Resp 15 | Ht 64.0 in | Wt 144.0 lb

## 2023-04-11 DIAGNOSIS — F419 Anxiety disorder, unspecified: Secondary | ICD-10-CM

## 2023-04-11 DIAGNOSIS — Z124 Encounter for screening for malignant neoplasm of cervix: Secondary | ICD-10-CM

## 2023-04-11 DIAGNOSIS — Z01419 Encounter for gynecological examination (general) (routine) without abnormal findings: Secondary | ICD-10-CM | POA: Diagnosis not present

## 2023-04-11 DIAGNOSIS — E785 Hyperlipidemia, unspecified: Secondary | ICD-10-CM

## 2023-04-11 DIAGNOSIS — Z1231 Encounter for screening mammogram for malignant neoplasm of breast: Secondary | ICD-10-CM

## 2023-04-11 NOTE — Progress Notes (Signed)
GYNECOLOGY ANNUAL PHYSICAL EXAM PROGRESS NOTE  Subjective:    Diana Brooks is a 42 y.o. G58P2002 female who presents for an annual exam. The patient has no complaints today. The patient is sexually active. The patient participates in regular exercise: yes. Has the patient ever been transfused or tattooed?: no. The patient reports that there is not domestic violence in her life.    Menstrual History: Menarche age: 66 No LMP recorded (within weeks). Period Cycle (Days): 26 Period Duration (Days): 5-7 Period Pattern: Regular Menstrual Flow: Light, Moderate Dysmenorrhea: None   Gynecologic History:  Contraception: condoms History of STI's:  Last Pap: 04/29/2022. Results were: normal. Denies/Notes h/o abnormal pap smears. Last mammogram: N/A.     Upstream - 04/11/23 1432       Pregnancy Intention Screening   Does the patient want to become pregnant in the next year? No    Does the patient's partner want to become pregnant in the next year? No    Would the patient like to discuss contraceptive options today? No      Contraception Wrap Up   Current Method Female Condom    End Method Female Condom    Contraception Counseling Provided No               OB History  Gravida Para Term Preterm AB Living  2 2 2  0 0 2  SAB IAB Ectopic Multiple Live Births  0 0 0 0 2    # Outcome Date GA Lbr Len/2nd Weight Sex Delivery Anes PTL Lv  2 Term 12/24/20 [redacted]w[redacted]d  9 lb 7.7 oz (4.3 kg) F CS-LTranv Spinal  LIV     Name: Lipkin,GIRL Andreika     Apgar1: 8  Apgar5: 9  1 Term 2013   8 lb 1.6 oz (3.674 kg) M CS-LTranv   LIV    Past Medical History:  Diagnosis Date   Anemia    Anxiety    Asthma    exercise induced   Complication of anesthesia    Dysmenorrhea    Endometriosis    Family history of adverse reaction to anesthesia    N/V from mother   GDM (gestational diabetes mellitus)    Hyperprolactinemia ([facility])    IBS (irritable bowel syndrome)    Infertility, female     PONV (postoperative nausea and vomiting)    states phenergan helps alot post op n/v    Past Surgical History:  Procedure Laterality Date   CESAREAN SECTION  2013   CESAREAN SECTION N/A 12/24/2020   Procedure: CESAREAN SECTION;  Surgeon: Hildred Laser, MD;  Location: [facility];  Service: Obstetrics;  Laterality: N/A;  REPEAT CESAREAN SECTION   CHROMOPERTUBATION N/A 01/11/2016   Procedure: CHROMOPERTUBATION OF FALLOPIAN TUBES;  Surgeon: Herold Harms, MD;  Location: [facility];  Service: Gynecology;  Laterality: N/A;   CHROMOPERTUBATION  2012   GANGLION CYST EXCISION Left    wrist   LAPAROSCOPY     lap with excision of fulguration of endometriosis   LAPAROSCOPY N/A 01/11/2016   Procedure: LAPAROSCOPY DIAGNOSTIC/ WITH EXCISION AND FULGERATION OF ENDOMETRIOSIS; MYOMECTOMY;  Surgeon: Herold Harms, MD;  Location: [facility];  Service: Gynecology;  Laterality: N/A;   skin cancer removal  03/10/2021   forehead    Family History  Problem Relation Age of Onset   Endometriosis Mother    Rheum arthritis Mother    Diabetes Paternal Grandfather    Prostate cancer Maternal Uncle    Healthy Father  Heart disease Neg Hx    Breast cancer Neg Hx    Ovarian cancer Neg Hx    Colon cancer Neg Hx     Social History   Socioeconomic History   Marital status: Married    Spouse name: Ryan   Number of children: 2   Years of education: Not on file   Highest education level: Not on file  Occupational History   Not on file  Tobacco Use   Smoking status: Never   Smokeless tobacco: Never  Vaping Use   Vaping Use: Never used  Substance and Sexual Activity   Alcohol use: Not Currently    Comment: rare   Drug use: No   Sexual activity: Yes    Birth control/protection: Condom  Other Topics Concern   Not on file  Social History Narrative   Not on file   Social Determinants of Health   Financial Resource Strain: Not on file  Food Insecurity: Not on file  Transportation Needs:  Not on file  Physical Activity: Insufficiently Active (02/27/2018)   Exercise Vital Sign    Days of Exercise per Week: 1 day    Minutes of Exercise per Session: 30 min  Stress: Not on file  Social Connections: Not on file  Intimate Partner Violence: Not on file    Current Outpatient Medications on File Prior to Visit  Medication Sig Dispense Refill   albuterol (VENTOLIN HFA) 108 (90 Base) MCG/ACT inhaler Inhale 1-2 puffs into the lungs every 6 (six) hours as needed for wheezing or shortness of breath. 6.7 g 3   Cholecalciferol (VITAMIN D-3) 125 MCG (5000 UT) TABS Take 5,000 Units by mouth 3 (three) times a week.     glucose blood test strip Use as instructed 100 each 12   ibuprofen (ADVIL) 800 MG tablet Take 800 mg by mouth 3 (three) times daily. As needed.     No current facility-administered medications on file prior to visit.    Allergies  Allergen Reactions   Levaquin [Levofloxacin In D5w] Other (See Comments)    Insomnia, fever, chills   Other Other (See Comments)    General Anesthesia-severe nausea/vomiting   Percocet [Oxycodone-Acetaminophen] Nausea And Vomiting   Vicodin [Hydrocodone-Acetaminophen] Nausea And Vomiting   Codeine Nausea And Vomiting   Sulfa Antibiotics Rash and Other (See Comments)    Full body rash  Full body rash   Suprax [Cefixime] Rash     Review of Systems Constitutional: negative for chills, fatigue, fevers and sweats Eyes: negative for irritation, redness and visual disturbance Ears, nose, mouth, throat, and face: negative for hearing loss, nasal congestion, snoring and tinnitus Respiratory: negative for asthma, cough, sputum Cardiovascular: negative for chest pain, dyspnea, exertional chest pressure/discomfort, irregular heart beat, palpitations and syncope Gastrointestinal: negative for abdominal pain, change in bowel habits, nausea and vomiting Genitourinary: negative for abnormal menstrual periods, genital lesions, sexual problems and  vaginal discharge, dysuria and urinary incontinence Integument/breast: negative for breast lump, breast tenderness and nipple discharge Hematologic/lymphatic: negative for bleeding and easy bruising Musculoskeletal:negative for back pain and muscle weakness Neurological: negative for dizziness, headaches, vertigo and weakness Endocrine: negative for diabetic symptoms including polydipsia, polyuria and skin dryness Allergic/Immunologic: negative for hay fever and urticaria      Objective:  Blood pressure 108/73, pulse 77, resp. rate 15, height 5\' 4"  (1.626 m), weight 144 lb (65.3 kg), not currently breastfeeding. Body mass index is 24.72 kg/m.    General Appearance:    Alert, cooperative, no distress, appears  stated age  Head:    Normocephalic, without obvious abnormality, atraumatic  Eyes:    PERRL, conjunctiva/corneas clear, EOM's intact, both eyes  Ears:    Normal external ear canals, both ears  Nose:   Nares normal, septum midline, mucosa normal, no drainage or sinus tenderness  Throat:   Lips, mucosa, and tongue normal; teeth and gums normal  Neck:   Supple, symmetrical, trachea midline, no adenopathy; thyroid: no enlargement/tenderness/nodules; no carotid bruit or JVD  Back:     Symmetric, no curvature, ROM normal, no CVA tenderness  Lungs:     Clear to auscultation bilaterally, respirations unlabored  Chest Wall:    No tenderness or deformity   Heart:    Regular rate and rhythm, S1 and S2 normal, no murmur, rub or gallop  Breast Exam:    No tenderness, masses, or nipple abnormality  Abdomen:     Soft, non-tender, bowel sounds active all four quadrants, no masses, no organomegaly.    Genitalia:    Pelvic:external genitalia normal, vagina without lesions, discharge, or tenderness, rectovaginal septum  normal. Cervix normal in appearance, no cervical motion tenderness, no adnexal masses or tenderness.  Uterus normal size, shape, mobile, regular contours, nontender.  Rectal:    Normal  external sphincter.  No hemorrhoids appreciated. Internal exam not done.   Extremities:   Extremities normal, atraumatic, no cyanosis or edema  Pulses:   2+ and symmetric all extremities  Skin:   Skin color, texture, turgor normal, no rashes or lesions  Lymph nodes:   Cervical, supraclavicular, and axillary nodes normal  Neurologic:   CNII-XII intact, normal strength, sensation and reflexes throughout   .  Labs:  Lab Results  Component Value Date   WBC 4.7 10/05/2021   HGB 13.6 10/05/2021   HCT 40.5 10/05/2021   MCV 88 10/05/2021   PLT 255 10/05/2021    Lab Results  Component Value Date   CREATININE 0.76 10/05/2021   BUN 14 10/05/2021   NA 139 10/05/2021   K 4.2 10/05/2021   CL 104 10/05/2021   CO2 24 05/07/2021    Lab Results  Component Value Date   ALT 27 10/05/2021   AST 26 10/05/2021   GGT 22 10/05/2021   ALKPHOS 106 10/05/2021   BILITOT 0.3 10/05/2021    Lab Results  Component Value Date   TSH 1.820 10/05/2021     Assessment:   1. Encounter for well woman exam with routine gynecological exam   2. Encounter for screening mammogram for malignant neoplasm of breast   3. Hyperlipidemia, unspecified hyperlipidemia type   4. Cervical cancer screening   5. Anxiety      Plan:  - Blood tests: has labs performed through her job. - Breast self exam technique reviewed and patient encouraged to perform self-exam monthly. - Contraception: condoms. - Discussed healthy lifestyle modifications. - Mammogram ordered - Pap smear discussed. Patient up to date with pap smears, however still desires yearly pap smear.  Informed that she may receive a bill from insurance company for testing as guidelines recommend q 3 years (or 5 years with HPV co-testing). Patient notes understanding, still desires pap, performed today.  - Hyperlipidemia, mild, no medications currently.  - Notes anxiety is much better controlled. Currently off Lexapro, uses Atarax on rare occasions now.  -  Follow up in 1 year for annual exam   Hildred Laser MD [facility]

## 2023-04-11 NOTE — Addendum Note (Signed)
Addended by: Tommie Raymond on: 04/11/2023 03:12 PM   Modules accepted: Orders

## 2023-04-11 NOTE — Patient Instructions (Signed)

## 2023-04-14 ENCOUNTER — Encounter: Payer: Self-pay | Admitting: Obstetrics and Gynecology

## 2023-04-17 LAB — CYTOLOGY - PAP: Diagnosis: NEGATIVE

## 2023-04-18 DIAGNOSIS — M4603 Spinal enthesopathy, cervicothoracic region: Secondary | ICD-10-CM | POA: Diagnosis not present

## 2023-04-18 DIAGNOSIS — M9904 Segmental and somatic dysfunction of sacral region: Secondary | ICD-10-CM | POA: Diagnosis not present

## 2023-04-18 DIAGNOSIS — M9903 Segmental and somatic dysfunction of lumbar region: Secondary | ICD-10-CM | POA: Diagnosis not present

## 2023-04-18 DIAGNOSIS — M9906 Segmental and somatic dysfunction of lower extremity: Secondary | ICD-10-CM | POA: Diagnosis not present

## 2023-04-18 DIAGNOSIS — M5387 Other specified dorsopathies, lumbosacral region: Secondary | ICD-10-CM | POA: Diagnosis not present

## 2023-04-18 DIAGNOSIS — M9901 Segmental and somatic dysfunction of cervical region: Secondary | ICD-10-CM | POA: Diagnosis not present

## 2023-04-18 DIAGNOSIS — M9902 Segmental and somatic dysfunction of thoracic region: Secondary | ICD-10-CM | POA: Diagnosis not present

## 2023-04-19 LAB — CYTOLOGY - PAP
Comment: NEGATIVE
Diagnosis: NEGATIVE
High risk HPV: NEGATIVE

## 2023-04-26 ENCOUNTER — Other Ambulatory Visit: Payer: Self-pay

## 2023-04-26 DIAGNOSIS — Z0000 Encounter for general adult medical examination without abnormal findings: Secondary | ICD-10-CM

## 2023-04-26 NOTE — Progress Notes (Signed)
Completed physical with Dr. Hildred Laser on 04/11/2023  AMD

## 2023-04-27 LAB — CMP12+LP+TP+TSH+6AC+CBC/D/PLT
ALT: 21 IU/L (ref 0–32)
AST: 19 IU/L (ref 0–40)
Albumin/Globulin Ratio: 1.9 (ref 1.2–2.2)
Albumin: 4.3 g/dL (ref 3.9–4.9)
Alkaline Phosphatase: 81 IU/L (ref 44–121)
BUN/Creatinine Ratio: 19 (ref 9–23)
BUN: 14 mg/dL (ref 6–20)
Basophils Absolute: 0 10*3/uL (ref 0.0–0.2)
Basos: 1 %
Bilirubin Total: 0.3 mg/dL (ref 0.0–1.2)
Calcium: 9.3 mg/dL (ref 8.7–10.2)
Chloride: 105 mmol/L (ref 96–106)
Chol/HDL Ratio: 2.9 ratio (ref 0.0–4.4)
Cholesterol, Total: 167 mg/dL (ref 100–199)
Creatinine, Ser: 0.75 mg/dL (ref 0.57–1.00)
EOS (ABSOLUTE): 0.1 10*3/uL (ref 0.0–0.4)
Eos: 2 %
Estimated CHD Risk: <0.5 times avg. (ref 0.0–1.0)
Free Thyroxine Index: 1.7 (ref 1.2–4.9)
GGT: 19 IU/L (ref 0–60)
Globulin, Total: 2.3 g/dL (ref 1.5–4.5)
Glucose: 88 mg/dL (ref 70–99)
HDL: 58 mg/dL (ref >39)
Hematocrit: 36.4 % (ref 34.0–46.6)
Hemoglobin: 12.3 g/dL (ref 11.1–15.9)
Immature Grans (Abs): 0 10*3/uL (ref 0.0–0.1)
Immature Granulocytes: 0 %
Iron: 36 ug/dL (ref 27–159)
LDH: 127 IU/L (ref 119–226)
LDL Chol Calc (NIH): 92 mg/dL (ref 0–99)
Lymphocytes Absolute: 2.1 10*3/uL (ref 0.7–3.1)
Lymphs: 38 %
MCH: 29.5 pg (ref 26.6–33.0)
MCHC: 33.8 g/dL (ref 31.5–35.7)
MCV: 87 fL (ref 79–97)
Monocytes Absolute: 0.5 10*3/uL (ref 0.1–0.9)
Monocytes: 9 %
Neutrophils Absolute: 2.8 10*3/uL (ref 1.4–7.0)
Neutrophils: 50 %
Phosphorus: 3.7 mg/dL (ref 3.0–4.3)
Platelets: 203 10*3/uL (ref 150–450)
Potassium: 4.4 mmol/L (ref 3.5–5.2)
RBC: 4.17 x10E6/uL (ref 3.77–5.28)
RDW: 13.3 % (ref 11.7–15.4)
Sodium: 142 mmol/L (ref 134–144)
T3 Uptake Ratio: 26 % (ref 24–39)
T4, Total: 6.5 ug/dL (ref 4.5–12.0)
TSH: 2.94 u[IU]/mL (ref 0.450–4.500)
Total Protein: 6.6 g/dL (ref 6.0–8.5)
Triglycerides: 95 mg/dL (ref 0–149)
Uric Acid: 3.3 mg/dL (ref 2.6–6.2)
VLDL Cholesterol Cal: 17 mg/dL (ref 5–40)
WBC: 5.5 10*3/uL (ref 3.4–10.8)
eGFR: 104 mL/min/{1.73_m2} (ref >59)

## 2023-05-03 DIAGNOSIS — M9902 Segmental and somatic dysfunction of thoracic region: Secondary | ICD-10-CM | POA: Diagnosis not present

## 2023-05-03 DIAGNOSIS — M9901 Segmental and somatic dysfunction of cervical region: Secondary | ICD-10-CM | POA: Diagnosis not present

## 2023-05-03 DIAGNOSIS — M9906 Segmental and somatic dysfunction of lower extremity: Secondary | ICD-10-CM | POA: Diagnosis not present

## 2023-05-03 DIAGNOSIS — M5387 Other specified dorsopathies, lumbosacral region: Secondary | ICD-10-CM | POA: Diagnosis not present

## 2023-05-03 DIAGNOSIS — M4603 Spinal enthesopathy, cervicothoracic region: Secondary | ICD-10-CM | POA: Diagnosis not present

## 2023-05-03 DIAGNOSIS — M9904 Segmental and somatic dysfunction of sacral region: Secondary | ICD-10-CM | POA: Diagnosis not present

## 2023-05-03 DIAGNOSIS — M9903 Segmental and somatic dysfunction of lumbar region: Secondary | ICD-10-CM | POA: Diagnosis not present

## 2023-05-05 DIAGNOSIS — D2261 Melanocytic nevi of right upper limb, including shoulder: Secondary | ICD-10-CM | POA: Diagnosis not present

## 2023-05-05 DIAGNOSIS — D2271 Melanocytic nevi of right lower limb, including hip: Secondary | ICD-10-CM | POA: Diagnosis not present

## 2023-05-05 DIAGNOSIS — D2262 Melanocytic nevi of left upper limb, including shoulder: Secondary | ICD-10-CM | POA: Diagnosis not present

## 2023-05-05 DIAGNOSIS — D225 Melanocytic nevi of trunk: Secondary | ICD-10-CM | POA: Diagnosis not present

## 2023-05-05 DIAGNOSIS — Z85828 Personal history of other malignant neoplasm of skin: Secondary | ICD-10-CM | POA: Diagnosis not present

## 2023-05-05 DIAGNOSIS — L249 Irritant contact dermatitis, unspecified cause: Secondary | ICD-10-CM | POA: Diagnosis not present

## 2023-05-17 DIAGNOSIS — M5387 Other specified dorsopathies, lumbosacral region: Secondary | ICD-10-CM | POA: Diagnosis not present

## 2023-05-17 DIAGNOSIS — M9904 Segmental and somatic dysfunction of sacral region: Secondary | ICD-10-CM | POA: Diagnosis not present

## 2023-05-17 DIAGNOSIS — M9902 Segmental and somatic dysfunction of thoracic region: Secondary | ICD-10-CM | POA: Diagnosis not present

## 2023-05-17 DIAGNOSIS — M4603 Spinal enthesopathy, cervicothoracic region: Secondary | ICD-10-CM | POA: Diagnosis not present

## 2023-05-17 DIAGNOSIS — M9906 Segmental and somatic dysfunction of lower extremity: Secondary | ICD-10-CM | POA: Diagnosis not present

## 2023-05-17 DIAGNOSIS — M9901 Segmental and somatic dysfunction of cervical region: Secondary | ICD-10-CM | POA: Diagnosis not present

## 2023-05-17 DIAGNOSIS — M9903 Segmental and somatic dysfunction of lumbar region: Secondary | ICD-10-CM | POA: Diagnosis not present

## 2023-05-18 DIAGNOSIS — M9903 Segmental and somatic dysfunction of lumbar region: Secondary | ICD-10-CM | POA: Diagnosis not present

## 2023-05-18 DIAGNOSIS — M4603 Spinal enthesopathy, cervicothoracic region: Secondary | ICD-10-CM | POA: Diagnosis not present

## 2023-05-18 DIAGNOSIS — M5387 Other specified dorsopathies, lumbosacral region: Secondary | ICD-10-CM | POA: Diagnosis not present

## 2023-05-18 DIAGNOSIS — M9906 Segmental and somatic dysfunction of lower extremity: Secondary | ICD-10-CM | POA: Diagnosis not present

## 2023-05-18 DIAGNOSIS — M9902 Segmental and somatic dysfunction of thoracic region: Secondary | ICD-10-CM | POA: Diagnosis not present

## 2023-05-18 DIAGNOSIS — M9904 Segmental and somatic dysfunction of sacral region: Secondary | ICD-10-CM | POA: Diagnosis not present

## 2023-05-18 DIAGNOSIS — M9901 Segmental and somatic dysfunction of cervical region: Secondary | ICD-10-CM | POA: Diagnosis not present

## 2023-06-15 DIAGNOSIS — M9903 Segmental and somatic dysfunction of lumbar region: Secondary | ICD-10-CM | POA: Diagnosis not present

## 2023-06-15 DIAGNOSIS — M9901 Segmental and somatic dysfunction of cervical region: Secondary | ICD-10-CM | POA: Diagnosis not present

## 2023-06-15 DIAGNOSIS — M9902 Segmental and somatic dysfunction of thoracic region: Secondary | ICD-10-CM | POA: Diagnosis not present

## 2023-06-15 DIAGNOSIS — M4603 Spinal enthesopathy, cervicothoracic region: Secondary | ICD-10-CM | POA: Diagnosis not present

## 2023-06-15 DIAGNOSIS — M9904 Segmental and somatic dysfunction of sacral region: Secondary | ICD-10-CM | POA: Diagnosis not present

## 2023-06-15 DIAGNOSIS — M9906 Segmental and somatic dysfunction of lower extremity: Secondary | ICD-10-CM | POA: Diagnosis not present

## 2023-06-15 DIAGNOSIS — M5387 Other specified dorsopathies, lumbosacral region: Secondary | ICD-10-CM | POA: Diagnosis not present

## 2023-06-28 DIAGNOSIS — M9902 Segmental and somatic dysfunction of thoracic region: Secondary | ICD-10-CM | POA: Diagnosis not present

## 2023-06-28 DIAGNOSIS — M9903 Segmental and somatic dysfunction of lumbar region: Secondary | ICD-10-CM | POA: Diagnosis not present

## 2023-06-28 DIAGNOSIS — M5387 Other specified dorsopathies, lumbosacral region: Secondary | ICD-10-CM | POA: Diagnosis not present

## 2023-06-28 DIAGNOSIS — M9904 Segmental and somatic dysfunction of sacral region: Secondary | ICD-10-CM | POA: Diagnosis not present

## 2023-06-28 DIAGNOSIS — M9901 Segmental and somatic dysfunction of cervical region: Secondary | ICD-10-CM | POA: Diagnosis not present

## 2023-06-28 DIAGNOSIS — M4603 Spinal enthesopathy, cervicothoracic region: Secondary | ICD-10-CM | POA: Diagnosis not present

## 2023-06-28 DIAGNOSIS — M9906 Segmental and somatic dysfunction of lower extremity: Secondary | ICD-10-CM | POA: Diagnosis not present

## 2023-07-06 DIAGNOSIS — M9906 Segmental and somatic dysfunction of lower extremity: Secondary | ICD-10-CM | POA: Diagnosis not present

## 2023-07-06 DIAGNOSIS — M9903 Segmental and somatic dysfunction of lumbar region: Secondary | ICD-10-CM | POA: Diagnosis not present

## 2023-07-06 DIAGNOSIS — M9902 Segmental and somatic dysfunction of thoracic region: Secondary | ICD-10-CM | POA: Diagnosis not present

## 2023-07-06 DIAGNOSIS — M5387 Other specified dorsopathies, lumbosacral region: Secondary | ICD-10-CM | POA: Diagnosis not present

## 2023-07-06 DIAGNOSIS — M9904 Segmental and somatic dysfunction of sacral region: Secondary | ICD-10-CM | POA: Diagnosis not present

## 2023-07-06 DIAGNOSIS — M9901 Segmental and somatic dysfunction of cervical region: Secondary | ICD-10-CM | POA: Diagnosis not present

## 2023-07-06 DIAGNOSIS — M4603 Spinal enthesopathy, cervicothoracic region: Secondary | ICD-10-CM | POA: Diagnosis not present

## 2023-07-28 DIAGNOSIS — H524 Presbyopia: Secondary | ICD-10-CM | POA: Diagnosis not present

## 2023-07-31 ENCOUNTER — Encounter: Payer: Self-pay | Admitting: Physician Assistant

## 2023-07-31 ENCOUNTER — Ambulatory Visit: Payer: Self-pay | Admitting: Physician Assistant

## 2023-07-31 VITALS — BP 111/72 | HR 85 | Temp 97.8°F | Resp 14 | Ht 64.0 in | Wt 135.0 lb

## 2023-07-31 DIAGNOSIS — J0100 Acute maxillary sinusitis, unspecified: Secondary | ICD-10-CM

## 2023-07-31 MED ORDER — FEXOFENADINE-PSEUDOEPHED ER 60-120 MG PO TB12: 1.0000 | ORAL_TABLET | Freq: Two times a day (BID) | ORAL | 0 refills | Status: DC

## 2023-07-31 MED ORDER — AZITHROMYCIN 250 MG PO TABS: ORAL_TABLET | ORAL | 0 refills | Status: AC

## 2023-07-31 NOTE — Progress Notes (Signed)
   Subjective: Sinus congestion    Patient ID: Diana Brooks, female    DOB: Jul 09, 1981, 42 y.o.   MRN: [id number]  HPI Patient complain of sinus congestion for 1 week.  He states he has a thick greenish nasal drainage.  Patient also complain of postnasal drainage causing a cough.  Denies fever/chills.  No recent travel or known contact with COVID-19.   Review of Systems Asthma which is exercise-induced, hyperlipidemia, and migraines    Objective:   Physical Exam BP 111/72  Pulse 85  Resp 14  Temp 97.8 F (36.6 C)  Temp src Temporal  SpO2 99 %  Weight 135 lb (61.2 kg)  Height 5\' 4"  (1.626 m)  HEENT remarkable bilateral edematous nasal turbinate thick nasal drainage.  Bilateral maxillary guarding palpation.  Postnasal drainage.  Neck is supple followed lymphadenopathy or bruits.  Lungs are clear to auscultation.  Heart regular rate and rhythm.       Assessment & Plan: Subacute maxillary sinusitis  Initial plan was to start antibiotics of amoxicillin.  Patient states she prefers Z-Pak.  Patient also given a prescription for Allegra-D.  Advised to follow-up if no improvement or worsening complaints.

## 2023-07-31 NOTE — Progress Notes (Signed)
Pt states symptoms started a week ago, bright green mucus, post nasal drip, going down into chest, congestion and facial pressure. Very fatigued.

## 2023-08-08 ENCOUNTER — Ambulatory Visit
Admission: RE | Admit: 2023-08-08 | Discharge: 2023-08-08 | Disposition: A | Discharge status: Home / Self Care | Payer: 59 | Source: Ambulatory Visit | Attending: Obstetrics and Gynecology | Admitting: Obstetrics and Gynecology

## 2023-08-08 DIAGNOSIS — Z1231 Encounter for screening mammogram for malignant neoplasm of breast: Secondary | ICD-10-CM | POA: Insufficient documentation

## 2023-08-08 DIAGNOSIS — Z01419 Encounter for gynecological examination (general) (routine) without abnormal findings: Secondary | ICD-10-CM | POA: Diagnosis present

## 2023-08-17 DIAGNOSIS — M9906 Segmental and somatic dysfunction of lower extremity: Secondary | ICD-10-CM | POA: Diagnosis not present

## 2023-08-17 DIAGNOSIS — M9903 Segmental and somatic dysfunction of lumbar region: Secondary | ICD-10-CM | POA: Diagnosis not present

## 2023-08-17 DIAGNOSIS — M9902 Segmental and somatic dysfunction of thoracic region: Secondary | ICD-10-CM | POA: Diagnosis not present

## 2023-08-17 DIAGNOSIS — M9904 Segmental and somatic dysfunction of sacral region: Secondary | ICD-10-CM | POA: Diagnosis not present

## 2023-08-17 DIAGNOSIS — M9901 Segmental and somatic dysfunction of cervical region: Secondary | ICD-10-CM | POA: Diagnosis not present

## 2023-08-17 DIAGNOSIS — M5387 Other specified dorsopathies, lumbosacral region: Secondary | ICD-10-CM | POA: Diagnosis not present

## 2023-08-17 DIAGNOSIS — M4603 Spinal enthesopathy, cervicothoracic region: Secondary | ICD-10-CM | POA: Diagnosis not present

## 2023-08-21 DIAGNOSIS — M4603 Spinal enthesopathy, cervicothoracic region: Secondary | ICD-10-CM | POA: Diagnosis not present

## 2023-08-21 DIAGNOSIS — M9906 Segmental and somatic dysfunction of lower extremity: Secondary | ICD-10-CM | POA: Diagnosis not present

## 2023-08-21 DIAGNOSIS — M9904 Segmental and somatic dysfunction of sacral region: Secondary | ICD-10-CM | POA: Diagnosis not present

## 2023-08-21 DIAGNOSIS — M9901 Segmental and somatic dysfunction of cervical region: Secondary | ICD-10-CM | POA: Diagnosis not present

## 2023-08-21 DIAGNOSIS — M9903 Segmental and somatic dysfunction of lumbar region: Secondary | ICD-10-CM | POA: Diagnosis not present

## 2023-08-21 DIAGNOSIS — M9902 Segmental and somatic dysfunction of thoracic region: Secondary | ICD-10-CM | POA: Diagnosis not present

## 2023-08-21 DIAGNOSIS — M5387 Other specified dorsopathies, lumbosacral region: Secondary | ICD-10-CM | POA: Diagnosis not present

## 2023-09-13 DIAGNOSIS — M9903 Segmental and somatic dysfunction of lumbar region: Secondary | ICD-10-CM | POA: Diagnosis not present

## 2023-09-13 DIAGNOSIS — M9901 Segmental and somatic dysfunction of cervical region: Secondary | ICD-10-CM | POA: Diagnosis not present

## 2023-09-13 DIAGNOSIS — M5387 Other specified dorsopathies, lumbosacral region: Secondary | ICD-10-CM | POA: Diagnosis not present

## 2023-09-13 DIAGNOSIS — M9904 Segmental and somatic dysfunction of sacral region: Secondary | ICD-10-CM | POA: Diagnosis not present

## 2023-09-13 DIAGNOSIS — M9902 Segmental and somatic dysfunction of thoracic region: Secondary | ICD-10-CM | POA: Diagnosis not present

## 2023-09-13 DIAGNOSIS — M9906 Segmental and somatic dysfunction of lower extremity: Secondary | ICD-10-CM | POA: Diagnosis not present

## 2023-09-13 DIAGNOSIS — M4603 Spinal enthesopathy, cervicothoracic region: Secondary | ICD-10-CM | POA: Diagnosis not present

## 2023-09-28 DIAGNOSIS — M9902 Segmental and somatic dysfunction of thoracic region: Secondary | ICD-10-CM | POA: Diagnosis not present

## 2023-09-28 DIAGNOSIS — M9901 Segmental and somatic dysfunction of cervical region: Secondary | ICD-10-CM | POA: Diagnosis not present

## 2023-09-28 DIAGNOSIS — M9903 Segmental and somatic dysfunction of lumbar region: Secondary | ICD-10-CM | POA: Diagnosis not present

## 2023-09-28 DIAGNOSIS — M9906 Segmental and somatic dysfunction of lower extremity: Secondary | ICD-10-CM | POA: Diagnosis not present

## 2023-09-28 DIAGNOSIS — M9904 Segmental and somatic dysfunction of sacral region: Secondary | ICD-10-CM | POA: Diagnosis not present

## 2023-09-28 DIAGNOSIS — M5387 Other specified dorsopathies, lumbosacral region: Secondary | ICD-10-CM | POA: Diagnosis not present

## 2023-09-28 DIAGNOSIS — M4603 Spinal enthesopathy, cervicothoracic region: Secondary | ICD-10-CM | POA: Diagnosis not present

## 2023-10-04 ENCOUNTER — Ambulatory Visit: Payer: Self-pay | Admitting: Physician Assistant

## 2023-10-04 ENCOUNTER — Encounter: Payer: Self-pay | Admitting: Physician Assistant

## 2023-10-04 ENCOUNTER — Ambulatory Visit
Admission: RE | Admit: 2023-10-04 | Discharge: 2023-10-04 | Disposition: A | Discharge status: Home / Self Care | Payer: 59 | Attending: Physician Assistant | Admitting: Physician Assistant

## 2023-10-04 ENCOUNTER — Ambulatory Visit
Admission: RE | Admit: 2023-10-04 | Discharge: 2023-10-04 | Disposition: A | Discharge status: Home / Self Care | Payer: 59 | Source: Ambulatory Visit | Attending: Physician Assistant | Admitting: Physician Assistant

## 2023-10-04 VITALS — BP 105/70 | HR 93 | Temp 98.0°F | Resp 14 | Ht 64.0 in | Wt 135.0 lb

## 2023-10-04 DIAGNOSIS — R059 Cough, unspecified: Secondary | ICD-10-CM | POA: Diagnosis not present

## 2023-10-04 DIAGNOSIS — R0989 Other specified symptoms and signs involving the circulatory and respiratory systems: Secondary | ICD-10-CM | POA: Diagnosis not present

## 2023-10-04 DIAGNOSIS — R509 Fever, unspecified: Secondary | ICD-10-CM | POA: Diagnosis not present

## 2023-10-04 LAB — POC COVID19 BINAXNOW: SARS Coronavirus 2 Ag: NEGATIVE

## 2023-10-04 MED ORDER — PSEUDOEPH-BROMPHEN-DM 30-2-10 MG/5ML PO SYRP: 5.0000 mL | ORAL_SOLUTION | Freq: Four times a day (QID) | ORAL | 0 refills | Status: DC | PRN

## 2023-10-04 MED ORDER — PREDNISONE 10 MG PO TABS: 10.0000 mg | ORAL_TABLET | Freq: Every day | ORAL | 0 refills | Status: DC

## 2023-10-04 NOTE — Progress Notes (Signed)
Pt presents today with a lot of pressure in chest. Pt was sick two weeks ago with a covid test of negative. Pt states she barley coughs through out the day but at night she will and its hard to catch her breath.

## 2023-10-04 NOTE — Progress Notes (Signed)
   Subjective: Chest congestion    Patient ID: Diana Brooks, female    DOB: 1981-05-01, 42 y.o.   MRN: [id number]  HPI Patient presents with 2 weeks of chest congestion.  Patient was seen via telehealth and prescribed Z-Pak and prednisone upon onset of complaint.  Patient states initially felt better for 2 to 3 days and then complaint returned.  Patient is also prescribed a combination decongestant antihistamine but elected to use Mucinex.  Exposed to COVID-19 by her husband and has tested negative x 2.   Review of Systems Asthma and migraine headaches    Objective:   Physical Exam BP 105/70  Pulse 93  Resp 14  Temp 98 F (36.7 C)  Temp src Temporal  SpO2 100 %  Weight 135 lb (61.2 kg)  Height 5\' 4"  (1.626 m)   BMI 23.17 kg/m2  BSA 1.66 m2  Patient appears malaised.  Afebrile. HEENT is unremarkable. Neck is supple lymphadenopathy or bruits. Lungs left rhonchi breath sounds. Heart is regular rate and rhythm.       Assessment & Plan: Respiratory infection.  Patient sent for stat x-ray.  Will follow-up telephonically after receiving the reading. No acute finding on x-ray. Discuss with patient treament plan of Prednisone and Bromfed DM.  Follow up 5 days if not improvement or worsen of compliant.

## 2023-10-09 ENCOUNTER — Telehealth: Payer: Self-pay

## 2023-10-09 NOTE — Telephone Encounter (Signed)
Received the following in email from [facility]:  I'm sorry, I'm on day 6, not 5.  Get Outlook for iOS  From: Talon Semelsberger Sent: Monday, October 09, 2023 12:52:34 PM To: Charm Barges [email or web address]> Subject: Prescription question    Diana Brooks,  I'm on day 5 of a 21-day prednisone taper prescribed by Dr Ferne Reus last week, and I cannot keep taking this. It's making me feel awful and it makes my moods horrible. (I've never liked taking it but wanted to give it another try.) Is there something else that can be prescribed for me in its place?   Thanks,  Diana Brooks

## 2023-10-11 DIAGNOSIS — M9904 Segmental and somatic dysfunction of sacral region: Secondary | ICD-10-CM | POA: Diagnosis not present

## 2023-10-11 DIAGNOSIS — M4603 Spinal enthesopathy, cervicothoracic region: Secondary | ICD-10-CM | POA: Diagnosis not present

## 2023-10-11 DIAGNOSIS — M9902 Segmental and somatic dysfunction of thoracic region: Secondary | ICD-10-CM | POA: Diagnosis not present

## 2023-10-11 DIAGNOSIS — M9903 Segmental and somatic dysfunction of lumbar region: Secondary | ICD-10-CM | POA: Diagnosis not present

## 2023-10-11 DIAGNOSIS — M9906 Segmental and somatic dysfunction of lower extremity: Secondary | ICD-10-CM | POA: Diagnosis not present

## 2023-10-11 DIAGNOSIS — M9901 Segmental and somatic dysfunction of cervical region: Secondary | ICD-10-CM | POA: Diagnosis not present

## 2023-10-11 DIAGNOSIS — M5387 Other specified dorsopathies, lumbosacral region: Secondary | ICD-10-CM | POA: Diagnosis not present

## 2023-10-26 ENCOUNTER — Ambulatory Visit: Payer: Self-pay

## 2023-10-26 DIAGNOSIS — Z23 Encounter for immunization: Secondary | ICD-10-CM

## 2023-11-10 DIAGNOSIS — Z85828 Personal history of other malignant neoplasm of skin: Secondary | ICD-10-CM | POA: Diagnosis not present

## 2023-11-10 DIAGNOSIS — D2262 Melanocytic nevi of left upper limb, including shoulder: Secondary | ICD-10-CM | POA: Diagnosis not present

## 2023-11-10 DIAGNOSIS — D225 Melanocytic nevi of trunk: Secondary | ICD-10-CM | POA: Diagnosis not present

## 2023-11-10 DIAGNOSIS — D2261 Melanocytic nevi of right upper limb, including shoulder: Secondary | ICD-10-CM | POA: Diagnosis not present

## 2023-11-10 DIAGNOSIS — L708 Other acne: Secondary | ICD-10-CM | POA: Diagnosis not present

## 2023-11-10 DIAGNOSIS — L731 Pseudofolliculitis barbae: Secondary | ICD-10-CM | POA: Diagnosis not present

## 2023-11-10 DIAGNOSIS — D2271 Melanocytic nevi of right lower limb, including hip: Secondary | ICD-10-CM | POA: Diagnosis not present

## 2023-11-10 DIAGNOSIS — L821 Other seborrheic keratosis: Secondary | ICD-10-CM | POA: Diagnosis not present

## 2023-11-10 DIAGNOSIS — Z872 Personal history of diseases of the skin and subcutaneous tissue: Secondary | ICD-10-CM | POA: Diagnosis not present

## 2023-11-10 DIAGNOSIS — D2272 Melanocytic nevi of left lower limb, including hip: Secondary | ICD-10-CM | POA: Diagnosis not present

## 2023-11-30 DIAGNOSIS — M4603 Spinal enthesopathy, cervicothoracic region: Secondary | ICD-10-CM | POA: Diagnosis not present

## 2023-11-30 DIAGNOSIS — M9901 Segmental and somatic dysfunction of cervical region: Secondary | ICD-10-CM | POA: Diagnosis not present

## 2023-11-30 DIAGNOSIS — M9903 Segmental and somatic dysfunction of lumbar region: Secondary | ICD-10-CM | POA: Diagnosis not present

## 2023-11-30 DIAGNOSIS — M9906 Segmental and somatic dysfunction of lower extremity: Secondary | ICD-10-CM | POA: Diagnosis not present

## 2023-11-30 DIAGNOSIS — M9902 Segmental and somatic dysfunction of thoracic region: Secondary | ICD-10-CM | POA: Diagnosis not present

## 2023-11-30 DIAGNOSIS — M9904 Segmental and somatic dysfunction of sacral region: Secondary | ICD-10-CM | POA: Diagnosis not present

## 2023-11-30 DIAGNOSIS — M5387 Other specified dorsopathies, lumbosacral region: Secondary | ICD-10-CM | POA: Diagnosis not present

## 2023-12-18 DIAGNOSIS — M4603 Spinal enthesopathy, cervicothoracic region: Secondary | ICD-10-CM | POA: Diagnosis not present

## 2023-12-18 DIAGNOSIS — M9901 Segmental and somatic dysfunction of cervical region: Secondary | ICD-10-CM | POA: Diagnosis not present

## 2023-12-18 DIAGNOSIS — M9903 Segmental and somatic dysfunction of lumbar region: Secondary | ICD-10-CM | POA: Diagnosis not present

## 2023-12-18 DIAGNOSIS — M5387 Other specified dorsopathies, lumbosacral region: Secondary | ICD-10-CM | POA: Diagnosis not present

## 2023-12-18 DIAGNOSIS — M9906 Segmental and somatic dysfunction of lower extremity: Secondary | ICD-10-CM | POA: Diagnosis not present

## 2023-12-18 DIAGNOSIS — M9904 Segmental and somatic dysfunction of sacral region: Secondary | ICD-10-CM | POA: Diagnosis not present

## 2023-12-18 DIAGNOSIS — M9902 Segmental and somatic dysfunction of thoracic region: Secondary | ICD-10-CM | POA: Diagnosis not present

## 2023-12-26 DIAGNOSIS — M9906 Segmental and somatic dysfunction of lower extremity: Secondary | ICD-10-CM | POA: Diagnosis not present

## 2023-12-26 DIAGNOSIS — M9903 Segmental and somatic dysfunction of lumbar region: Secondary | ICD-10-CM | POA: Diagnosis not present

## 2023-12-26 DIAGNOSIS — M9904 Segmental and somatic dysfunction of sacral region: Secondary | ICD-10-CM | POA: Diagnosis not present

## 2023-12-26 DIAGNOSIS — M9902 Segmental and somatic dysfunction of thoracic region: Secondary | ICD-10-CM | POA: Diagnosis not present

## 2023-12-26 DIAGNOSIS — M5387 Other specified dorsopathies, lumbosacral region: Secondary | ICD-10-CM | POA: Diagnosis not present

## 2023-12-26 DIAGNOSIS — M4603 Spinal enthesopathy, cervicothoracic region: Secondary | ICD-10-CM | POA: Diagnosis not present

## 2023-12-26 DIAGNOSIS — M9901 Segmental and somatic dysfunction of cervical region: Secondary | ICD-10-CM | POA: Diagnosis not present

## 2024-01-09 DIAGNOSIS — M9906 Segmental and somatic dysfunction of lower extremity: Secondary | ICD-10-CM | POA: Diagnosis not present

## 2024-01-09 DIAGNOSIS — M5387 Other specified dorsopathies, lumbosacral region: Secondary | ICD-10-CM | POA: Diagnosis not present

## 2024-01-09 DIAGNOSIS — M9903 Segmental and somatic dysfunction of lumbar region: Secondary | ICD-10-CM | POA: Diagnosis not present

## 2024-01-09 DIAGNOSIS — M4603 Spinal enthesopathy, cervicothoracic region: Secondary | ICD-10-CM | POA: Diagnosis not present

## 2024-01-09 DIAGNOSIS — M9904 Segmental and somatic dysfunction of sacral region: Secondary | ICD-10-CM | POA: Diagnosis not present

## 2024-01-09 DIAGNOSIS — M9901 Segmental and somatic dysfunction of cervical region: Secondary | ICD-10-CM | POA: Diagnosis not present

## 2024-01-09 DIAGNOSIS — M9902 Segmental and somatic dysfunction of thoracic region: Secondary | ICD-10-CM | POA: Diagnosis not present

## 2024-01-17 DIAGNOSIS — J0140 Acute pansinusitis, unspecified: Secondary | ICD-10-CM | POA: Diagnosis not present

## 2024-01-23 DIAGNOSIS — M9903 Segmental and somatic dysfunction of lumbar region: Secondary | ICD-10-CM | POA: Diagnosis not present

## 2024-01-23 DIAGNOSIS — M5387 Other specified dorsopathies, lumbosacral region: Secondary | ICD-10-CM | POA: Diagnosis not present

## 2024-01-23 DIAGNOSIS — M4603 Spinal enthesopathy, cervicothoracic region: Secondary | ICD-10-CM | POA: Diagnosis not present

## 2024-01-23 DIAGNOSIS — M9904 Segmental and somatic dysfunction of sacral region: Secondary | ICD-10-CM | POA: Diagnosis not present

## 2024-01-23 DIAGNOSIS — M9906 Segmental and somatic dysfunction of lower extremity: Secondary | ICD-10-CM | POA: Diagnosis not present

## 2024-01-23 DIAGNOSIS — M9901 Segmental and somatic dysfunction of cervical region: Secondary | ICD-10-CM | POA: Diagnosis not present

## 2024-01-23 DIAGNOSIS — M9902 Segmental and somatic dysfunction of thoracic region: Secondary | ICD-10-CM | POA: Diagnosis not present

## 2024-02-07 DIAGNOSIS — M9906 Segmental and somatic dysfunction of lower extremity: Secondary | ICD-10-CM | POA: Diagnosis not present

## 2024-02-07 DIAGNOSIS — M9901 Segmental and somatic dysfunction of cervical region: Secondary | ICD-10-CM | POA: Diagnosis not present

## 2024-02-07 DIAGNOSIS — M9904 Segmental and somatic dysfunction of sacral region: Secondary | ICD-10-CM | POA: Diagnosis not present

## 2024-02-07 DIAGNOSIS — M4603 Spinal enthesopathy, cervicothoracic region: Secondary | ICD-10-CM | POA: Diagnosis not present

## 2024-02-07 DIAGNOSIS — M9903 Segmental and somatic dysfunction of lumbar region: Secondary | ICD-10-CM | POA: Diagnosis not present

## 2024-02-07 DIAGNOSIS — M9902 Segmental and somatic dysfunction of thoracic region: Secondary | ICD-10-CM | POA: Diagnosis not present

## 2024-02-07 DIAGNOSIS — M5387 Other specified dorsopathies, lumbosacral region: Secondary | ICD-10-CM | POA: Diagnosis not present

## 2024-02-21 DIAGNOSIS — M9904 Segmental and somatic dysfunction of sacral region: Secondary | ICD-10-CM | POA: Diagnosis not present

## 2024-02-21 DIAGNOSIS — M9902 Segmental and somatic dysfunction of thoracic region: Secondary | ICD-10-CM | POA: Diagnosis not present

## 2024-02-21 DIAGNOSIS — M4603 Spinal enthesopathy, cervicothoracic region: Secondary | ICD-10-CM | POA: Diagnosis not present

## 2024-02-21 DIAGNOSIS — M9901 Segmental and somatic dysfunction of cervical region: Secondary | ICD-10-CM | POA: Diagnosis not present

## 2024-02-21 DIAGNOSIS — M5387 Other specified dorsopathies, lumbosacral region: Secondary | ICD-10-CM | POA: Diagnosis not present

## 2024-02-21 DIAGNOSIS — M9906 Segmental and somatic dysfunction of lower extremity: Secondary | ICD-10-CM | POA: Diagnosis not present

## 2024-02-21 DIAGNOSIS — M9903 Segmental and somatic dysfunction of lumbar region: Secondary | ICD-10-CM | POA: Diagnosis not present

## 2024-03-06 DIAGNOSIS — M9904 Segmental and somatic dysfunction of sacral region: Secondary | ICD-10-CM | POA: Diagnosis not present

## 2024-03-06 DIAGNOSIS — M5387 Other specified dorsopathies, lumbosacral region: Secondary | ICD-10-CM | POA: Diagnosis not present

## 2024-03-06 DIAGNOSIS — M4603 Spinal enthesopathy, cervicothoracic region: Secondary | ICD-10-CM | POA: Diagnosis not present

## 2024-03-06 DIAGNOSIS — M9902 Segmental and somatic dysfunction of thoracic region: Secondary | ICD-10-CM | POA: Diagnosis not present

## 2024-03-06 DIAGNOSIS — M9906 Segmental and somatic dysfunction of lower extremity: Secondary | ICD-10-CM | POA: Diagnosis not present

## 2024-03-06 DIAGNOSIS — M9903 Segmental and somatic dysfunction of lumbar region: Secondary | ICD-10-CM | POA: Diagnosis not present

## 2024-03-06 DIAGNOSIS — M9901 Segmental and somatic dysfunction of cervical region: Secondary | ICD-10-CM | POA: Diagnosis not present

## 2024-03-15 DIAGNOSIS — H16042 Marginal corneal ulcer, left eye: Secondary | ICD-10-CM | POA: Diagnosis not present

## 2024-03-18 DIAGNOSIS — H16042 Marginal corneal ulcer, left eye: Secondary | ICD-10-CM | POA: Diagnosis not present

## 2024-03-20 DIAGNOSIS — M9904 Segmental and somatic dysfunction of sacral region: Secondary | ICD-10-CM | POA: Diagnosis not present

## 2024-03-20 DIAGNOSIS — M9901 Segmental and somatic dysfunction of cervical region: Secondary | ICD-10-CM | POA: Diagnosis not present

## 2024-03-20 DIAGNOSIS — M9902 Segmental and somatic dysfunction of thoracic region: Secondary | ICD-10-CM | POA: Diagnosis not present

## 2024-03-20 DIAGNOSIS — M5387 Other specified dorsopathies, lumbosacral region: Secondary | ICD-10-CM | POA: Diagnosis not present

## 2024-03-20 DIAGNOSIS — M4603 Spinal enthesopathy, cervicothoracic region: Secondary | ICD-10-CM | POA: Diagnosis not present

## 2024-03-20 DIAGNOSIS — M9906 Segmental and somatic dysfunction of lower extremity: Secondary | ICD-10-CM | POA: Diagnosis not present

## 2024-03-20 DIAGNOSIS — M9903 Segmental and somatic dysfunction of lumbar region: Secondary | ICD-10-CM | POA: Diagnosis not present

## 2024-04-02 DIAGNOSIS — M9902 Segmental and somatic dysfunction of thoracic region: Secondary | ICD-10-CM | POA: Diagnosis not present

## 2024-04-02 DIAGNOSIS — M9904 Segmental and somatic dysfunction of sacral region: Secondary | ICD-10-CM | POA: Diagnosis not present

## 2024-04-02 DIAGNOSIS — M9901 Segmental and somatic dysfunction of cervical region: Secondary | ICD-10-CM | POA: Diagnosis not present

## 2024-04-02 DIAGNOSIS — M5387 Other specified dorsopathies, lumbosacral region: Secondary | ICD-10-CM | POA: Diagnosis not present

## 2024-04-02 DIAGNOSIS — M9903 Segmental and somatic dysfunction of lumbar region: Secondary | ICD-10-CM | POA: Diagnosis not present

## 2024-04-02 DIAGNOSIS — M4603 Spinal enthesopathy, cervicothoracic region: Secondary | ICD-10-CM | POA: Diagnosis not present

## 2024-04-02 DIAGNOSIS — M9906 Segmental and somatic dysfunction of lower extremity: Secondary | ICD-10-CM | POA: Diagnosis not present

## 2024-04-10 DIAGNOSIS — M9901 Segmental and somatic dysfunction of cervical region: Secondary | ICD-10-CM | POA: Diagnosis not present

## 2024-04-10 DIAGNOSIS — M9902 Segmental and somatic dysfunction of thoracic region: Secondary | ICD-10-CM | POA: Diagnosis not present

## 2024-04-10 DIAGNOSIS — M5387 Other specified dorsopathies, lumbosacral region: Secondary | ICD-10-CM | POA: Diagnosis not present

## 2024-04-10 DIAGNOSIS — M4603 Spinal enthesopathy, cervicothoracic region: Secondary | ICD-10-CM | POA: Diagnosis not present

## 2024-04-10 DIAGNOSIS — M9904 Segmental and somatic dysfunction of sacral region: Secondary | ICD-10-CM | POA: Diagnosis not present

## 2024-04-10 DIAGNOSIS — M9903 Segmental and somatic dysfunction of lumbar region: Secondary | ICD-10-CM | POA: Diagnosis not present

## 2024-04-10 DIAGNOSIS — M9906 Segmental and somatic dysfunction of lower extremity: Secondary | ICD-10-CM | POA: Diagnosis not present

## 2024-04-24 DIAGNOSIS — M5387 Other specified dorsopathies, lumbosacral region: Secondary | ICD-10-CM | POA: Diagnosis not present

## 2024-04-24 DIAGNOSIS — M9906 Segmental and somatic dysfunction of lower extremity: Secondary | ICD-10-CM | POA: Diagnosis not present

## 2024-04-24 DIAGNOSIS — M9901 Segmental and somatic dysfunction of cervical region: Secondary | ICD-10-CM | POA: Diagnosis not present

## 2024-04-24 DIAGNOSIS — M9902 Segmental and somatic dysfunction of thoracic region: Secondary | ICD-10-CM | POA: Diagnosis not present

## 2024-04-24 DIAGNOSIS — M9903 Segmental and somatic dysfunction of lumbar region: Secondary | ICD-10-CM | POA: Diagnosis not present

## 2024-04-24 DIAGNOSIS — M4603 Spinal enthesopathy, cervicothoracic region: Secondary | ICD-10-CM | POA: Diagnosis not present

## 2024-04-24 DIAGNOSIS — M9904 Segmental and somatic dysfunction of sacral region: Secondary | ICD-10-CM | POA: Diagnosis not present

## 2024-05-07 ENCOUNTER — Encounter: Payer: Self-pay | Admitting: Obstetrics and Gynecology

## 2024-05-07 ENCOUNTER — Ambulatory Visit (INDEPENDENT_AMBULATORY_CARE_PROVIDER_SITE_OTHER): Admitting: Obstetrics and Gynecology

## 2024-05-07 ENCOUNTER — Other Ambulatory Visit (HOSPITAL_COMMUNITY)
Admission: RE | Admit: 2024-05-07 | Discharge: 2024-05-07 | Disposition: A | Discharge status: Home / Self Care | Source: Ambulatory Visit | Attending: Obstetrics and Gynecology | Admitting: Obstetrics and Gynecology

## 2024-05-07 VITALS — BP 105/73 | HR 78 | Ht 64.0 in | Wt 147.0 lb

## 2024-05-07 DIAGNOSIS — Z01419 Encounter for gynecological examination (general) (routine) without abnormal findings: Secondary | ICD-10-CM | POA: Diagnosis not present

## 2024-05-07 DIAGNOSIS — Z1231 Encounter for screening mammogram for malignant neoplasm of breast: Secondary | ICD-10-CM

## 2024-05-07 DIAGNOSIS — Z124 Encounter for screening for malignant neoplasm of cervix: Secondary | ICD-10-CM

## 2024-05-07 DIAGNOSIS — F3281 Premenstrual dysphoric disorder: Secondary | ICD-10-CM

## 2024-05-07 NOTE — Progress Notes (Signed)
 Patients presents for annual exam today. She states complaints of one day mild cycle being overly emotional or irritable over the last year. Patient is requesting annual pap smear, ordered. Patient is due for mammogram in September, ordered. Annual labs are done through employer.

## 2024-05-07 NOTE — Progress Notes (Signed)
 HPI:      Ms. Diana Brooks is a 43 y.o. [id number] who LMP was Patient's last menstrual period was 04/21/2024 (approximate).  Subjective:   She presents today for her annual examination.  She states that she has always had heavy menstrual bleeding and periods.  (History of endometriosis) she states that she has begun having significant mood changes approximately 2 days before her menstrual period.  These are somewhat disrupting. Currently using condoms for birth control. Requests a Pap smear today.    Hx: The following portions of the patient's history were reviewed and updated as appropriate:             She  has a past medical history of Anemia, Anxiety, Asthma, Complication of anesthesia, Dysmenorrhea, Endometriosis, Family history of adverse reaction to anesthesia, GDM (gestational diabetes mellitus), Hyperprolactinemia (HCC), IBS (irritable bowel syndrome), Infertility, female, and PONV (postoperative nausea and vomiting). She does not have any pertinent problems on file. She  has a past surgical history that includes Cesarean section (2013); laparoscopy; laparoscopy (N/A, 01/11/2016); Chromopertubation (N/A, 01/11/2016); Ganglion cyst excision (Left); Chromopertubation (2012); Cesarean section (N/A, 12/24/2020); and skin cancer removal (03/10/2021). Her family history includes Diabetes in her paternal grandfather; Endometriosis in her mother; Healthy in her father; Prostate cancer in her maternal uncle; Rheum arthritis in her mother. She  reports that she has never smoked. She has never used smokeless tobacco. She reports that she does not currently use alcohol. She reports that she does not use drugs. She has a current medication list which includes the following prescription(s): albuterol , glucose blood, brompheniramine-pseudoephedrine -dm, and prednisone . She is allergic to levaquin [levofloxacin in d5w], other, percocet [oxycodone-acetaminophen ], vicodin [hydrocodone-acetaminophen ],  codeine, sulfa antibiotics, and suprax [cefixime].       Review of Systems:  Review of Systems  Constitutional: Denied constitutional symptoms, night sweats, recent illness, fatigue, fever, insomnia and weight loss.  Eyes: Denied eye symptoms, eye pain, photophobia, vision change and visual disturbance.  Ears/Nose/Throat/Neck: Denied ear, nose, throat or neck symptoms, hearing loss, nasal discharge, sinus congestion and sore throat.  Cardiovascular: Denied cardiovascular symptoms, arrhythmia, chest pain/pressure, edema, exercise intolerance, orthopnea and palpitations.  Respiratory: Denied pulmonary symptoms, asthma, pleuritic pain, productive sputum, cough, dyspnea and wheezing.  Gastrointestinal: Denied, gastro-esophageal reflux, melena, nausea and vomiting.  Genitourinary: See HPI for additional information.  Musculoskeletal: Denied musculoskeletal symptoms, stiffness, swelling, muscle weakness and myalgia.  Dermatologic: Denied dermatology symptoms, rash and scar.  Neurologic: Denied neurology symptoms, dizziness, headache, neck pain and syncope.  Psychiatric: Denied psychiatric symptoms, anxiety and depression.  Endocrine: Denied endocrine symptoms including hot flashes and night sweats.   Meds:   Current Outpatient Medications on File Prior to Visit  Medication Sig Dispense Refill   albuterol  (VENTOLIN  HFA) 108 (90 Base) MCG/ACT inhaler Inhale 1-2 puffs into the lungs every 6 (six) hours as needed for wheezing or shortness of breath. 6.7 g 3   glucose blood test strip Use as instructed 100 each 12   brompheniramine-pseudoephedrine -DM 30-2-10 MG/5ML syrup Take 5 mLs by mouth 4 (four) times daily as needed. 120 mL 0   predniSONE  (DELTASONE ) 10 MG tablet Take 1 tablet (10 mg total) by mouth daily with breakfast. Take 3 tablets daily for 7 days, then 2 tablets daily for 7 days, and finally 1 tablet daily for 7 days 35 tablet 0   No current facility-administered medications on file prior  to visit.     Objective:     Vitals:   05/07/24 0932  BP: 105/73  Pulse: 78    Filed Weights   05/07/24 0932  Weight: 147 lb (66.7 kg)              Physical examination General NAD, Conversant  HEENT Atraumatic; Op clear with mmm.  Normo-cephalic.  Anicteric sclerae  Thyroid /Neck Smooth without nodularity or enlargement. Normal ROM.  Neck Supple.  Skin No rashes, lesions or ulceration. Normal palpated skin turgor. No nodularity.  Breasts: No masses or discharge.  Symmetric.  No axillary adenopathy.  Lungs: Clear to auscultation.No rales or wheezes. Normal Respiratory effort, no retractions.  Heart: NSR.  No murmurs or rubs appreciated. No peripheral edema  Abdomen: Soft.  Non-tender.  No masses.  No HSM. No hernia  Extremities: Moves all appropriately.  Normal ROM for age. No lymphadenopathy.  Neuro: Oriented to PPT.  Normal mood. Normal affect.     Pelvic:   Vulva: Normal appearance.  No lesions.  Vagina: No lesions or abnormalities noted.  Support: Normal pelvic support.  Urethra No masses tenderness or scarring.  Meatus Normal size without lesions or prolapse.  Cervix: Normal appearance.  No lesions.  Anus: Normal exam.  No lesions.  Perineum: Normal exam.  No lesions.        Bimanual   Uterus: Normal size.  Non-tender.  Mobile.  Retroverted  Adnexae: No masses.  Non-tender to palpation.  Cul-de-sac: Negative for abnormality.     Assessment:    G2P2002 Patient Active Problem List   Diagnosis Date Noted   Hx of migraines 06/27/2020   Mild exercise-induced asthma 01/23/2020   BMI 25.0-25.9,adult 01/23/2020   Hyperlipidemia LDL goal <100 01/23/2020   History of gestational diabetes 01/23/2020   History of squamous cell carcinoma excision 01/23/2020   History of dysplastic nevus 01/23/2020   Ganglion of wrist 01/22/2020   Endometriosis 10/22/2015   Infertility, female, secondary 10/22/2015   Cyst of nipple 10/22/2015     1. Encounter for well woman exam  with routine gynecological exam   2. Screening mammogram for breast cancer   3. Cervical cancer screening   4. PMDD (premenstrual dysphoric disorder)     Normal exam.  Seems to be developing more issues with possible PMDD.   Plan:            1.  Basic Screening Recommendations The basic screening recommendations for asymptomatic women were discussed with the patient during her visit.  The age-appropriate recommendations were discussed with her and the rational for the tests reviewed.  When I am informed by the patient that another primary care physician has previously obtained the age-appropriate tests and they are up-to-date, only outstanding tests are ordered and referrals given as necessary.  Abnormal results of tests will be discussed with her when all of her results are completed.  Routine preventative health maintenance measures emphasized: Exercise/Diet/Weight control, Tobacco Warnings, Alcohol/Substance use risks and Stress Management Pap performed at patient request -mammogram ordered 2.  Discussed PMDD and use of OCPs, IUD and Zoloft.  Patient will consider these options and inform us  if she would like to try any of them. 3.  She was unaware of her retroverted uterus and its relationship to endometriosis and pelvic pain.  We have discussed this in some detail.  Orders No orders of the defined types were placed in this encounter.   No orders of the defined types were placed in this encounter.        F/U  Return in about 1 year (around 05/07/2025) for Annual Physical.  Myrtie Atkinson  Wyvonne Height, M.D. 05/07/2024 10:29 AM

## 2024-05-08 DIAGNOSIS — M9902 Segmental and somatic dysfunction of thoracic region: Secondary | ICD-10-CM | POA: Diagnosis not present

## 2024-05-08 DIAGNOSIS — M4603 Spinal enthesopathy, cervicothoracic region: Secondary | ICD-10-CM | POA: Diagnosis not present

## 2024-05-08 DIAGNOSIS — M9904 Segmental and somatic dysfunction of sacral region: Secondary | ICD-10-CM | POA: Diagnosis not present

## 2024-05-08 DIAGNOSIS — M5387 Other specified dorsopathies, lumbosacral region: Secondary | ICD-10-CM | POA: Diagnosis not present

## 2024-05-08 DIAGNOSIS — M9901 Segmental and somatic dysfunction of cervical region: Secondary | ICD-10-CM | POA: Diagnosis not present

## 2024-05-08 DIAGNOSIS — M9903 Segmental and somatic dysfunction of lumbar region: Secondary | ICD-10-CM | POA: Diagnosis not present

## 2024-05-08 DIAGNOSIS — M9906 Segmental and somatic dysfunction of lower extremity: Secondary | ICD-10-CM | POA: Diagnosis not present

## 2024-05-08 LAB — CYTOLOGY - PAP
Comment: NEGATIVE
Diagnosis: NEGATIVE
High risk HPV: NEGATIVE

## 2024-05-17 DIAGNOSIS — D2262 Melanocytic nevi of left upper limb, including shoulder: Secondary | ICD-10-CM | POA: Diagnosis not present

## 2024-05-17 DIAGNOSIS — Z85828 Personal history of other malignant neoplasm of skin: Secondary | ICD-10-CM | POA: Diagnosis not present

## 2024-05-17 DIAGNOSIS — D225 Melanocytic nevi of trunk: Secondary | ICD-10-CM | POA: Diagnosis not present

## 2024-05-17 DIAGNOSIS — C4441 Basal cell carcinoma of skin of scalp and neck: Secondary | ICD-10-CM | POA: Diagnosis not present

## 2024-05-17 DIAGNOSIS — D2261 Melanocytic nevi of right upper limb, including shoulder: Secondary | ICD-10-CM | POA: Diagnosis not present

## 2024-05-17 DIAGNOSIS — D485 Neoplasm of uncertain behavior of skin: Secondary | ICD-10-CM | POA: Diagnosis not present

## 2024-05-17 DIAGNOSIS — D2272 Melanocytic nevi of left lower limb, including hip: Secondary | ICD-10-CM | POA: Diagnosis not present

## 2024-05-22 DIAGNOSIS — M9902 Segmental and somatic dysfunction of thoracic region: Secondary | ICD-10-CM | POA: Diagnosis not present

## 2024-05-22 DIAGNOSIS — M5387 Other specified dorsopathies, lumbosacral region: Secondary | ICD-10-CM | POA: Diagnosis not present

## 2024-05-22 DIAGNOSIS — M9904 Segmental and somatic dysfunction of sacral region: Secondary | ICD-10-CM | POA: Diagnosis not present

## 2024-05-22 DIAGNOSIS — M9901 Segmental and somatic dysfunction of cervical region: Secondary | ICD-10-CM | POA: Diagnosis not present

## 2024-05-22 DIAGNOSIS — M4603 Spinal enthesopathy, cervicothoracic region: Secondary | ICD-10-CM | POA: Diagnosis not present

## 2024-05-22 DIAGNOSIS — M9906 Segmental and somatic dysfunction of lower extremity: Secondary | ICD-10-CM | POA: Diagnosis not present

## 2024-05-22 DIAGNOSIS — M9903 Segmental and somatic dysfunction of lumbar region: Secondary | ICD-10-CM | POA: Diagnosis not present

## 2024-05-28 DIAGNOSIS — M9903 Segmental and somatic dysfunction of lumbar region: Secondary | ICD-10-CM | POA: Diagnosis not present

## 2024-05-28 DIAGNOSIS — M9904 Segmental and somatic dysfunction of sacral region: Secondary | ICD-10-CM | POA: Diagnosis not present

## 2024-05-28 DIAGNOSIS — M5441 Lumbago with sciatica, right side: Secondary | ICD-10-CM | POA: Diagnosis not present

## 2024-05-28 DIAGNOSIS — M25551 Pain in right hip: Secondary | ICD-10-CM | POA: Diagnosis not present

## 2024-05-28 DIAGNOSIS — M7918 Myalgia, other site: Secondary | ICD-10-CM | POA: Diagnosis not present

## 2024-05-29 DIAGNOSIS — M25551 Pain in right hip: Secondary | ICD-10-CM | POA: Diagnosis not present

## 2024-05-29 DIAGNOSIS — M5441 Lumbago with sciatica, right side: Secondary | ICD-10-CM | POA: Diagnosis not present

## 2024-05-29 DIAGNOSIS — M9904 Segmental and somatic dysfunction of sacral region: Secondary | ICD-10-CM | POA: Diagnosis not present

## 2024-05-29 DIAGNOSIS — M9903 Segmental and somatic dysfunction of lumbar region: Secondary | ICD-10-CM | POA: Diagnosis not present

## 2024-05-29 DIAGNOSIS — M7918 Myalgia, other site: Secondary | ICD-10-CM | POA: Diagnosis not present

## 2024-05-30 DIAGNOSIS — M25551 Pain in right hip: Secondary | ICD-10-CM | POA: Diagnosis not present

## 2024-05-30 DIAGNOSIS — M5441 Lumbago with sciatica, right side: Secondary | ICD-10-CM | POA: Diagnosis not present

## 2024-05-30 DIAGNOSIS — M9903 Segmental and somatic dysfunction of lumbar region: Secondary | ICD-10-CM | POA: Diagnosis not present

## 2024-05-30 DIAGNOSIS — M9904 Segmental and somatic dysfunction of sacral region: Secondary | ICD-10-CM | POA: Diagnosis not present

## 2024-05-30 DIAGNOSIS — M7918 Myalgia, other site: Secondary | ICD-10-CM | POA: Diagnosis not present

## 2024-05-31 DIAGNOSIS — M9903 Segmental and somatic dysfunction of lumbar region: Secondary | ICD-10-CM | POA: Diagnosis not present

## 2024-05-31 DIAGNOSIS — M7918 Myalgia, other site: Secondary | ICD-10-CM | POA: Diagnosis not present

## 2024-05-31 DIAGNOSIS — M9904 Segmental and somatic dysfunction of sacral region: Secondary | ICD-10-CM | POA: Diagnosis not present

## 2024-05-31 DIAGNOSIS — M25551 Pain in right hip: Secondary | ICD-10-CM | POA: Diagnosis not present

## 2024-05-31 DIAGNOSIS — M5441 Lumbago with sciatica, right side: Secondary | ICD-10-CM | POA: Diagnosis not present

## 2024-06-03 DIAGNOSIS — M9902 Segmental and somatic dysfunction of thoracic region: Secondary | ICD-10-CM | POA: Diagnosis not present

## 2024-06-03 DIAGNOSIS — M5387 Other specified dorsopathies, lumbosacral region: Secondary | ICD-10-CM | POA: Diagnosis not present

## 2024-06-03 DIAGNOSIS — M9901 Segmental and somatic dysfunction of cervical region: Secondary | ICD-10-CM | POA: Diagnosis not present

## 2024-06-03 DIAGNOSIS — M5417 Radiculopathy, lumbosacral region: Secondary | ICD-10-CM | POA: Diagnosis not present

## 2024-06-03 DIAGNOSIS — M9903 Segmental and somatic dysfunction of lumbar region: Secondary | ICD-10-CM | POA: Diagnosis not present

## 2024-06-03 DIAGNOSIS — M4603 Spinal enthesopathy, cervicothoracic region: Secondary | ICD-10-CM | POA: Diagnosis not present

## 2024-06-03 DIAGNOSIS — M9906 Segmental and somatic dysfunction of lower extremity: Secondary | ICD-10-CM | POA: Diagnosis not present

## 2024-06-03 DIAGNOSIS — M9904 Segmental and somatic dysfunction of sacral region: Secondary | ICD-10-CM | POA: Diagnosis not present

## 2024-06-10 DIAGNOSIS — M5417 Radiculopathy, lumbosacral region: Secondary | ICD-10-CM | POA: Diagnosis not present

## 2024-06-10 DIAGNOSIS — M9902 Segmental and somatic dysfunction of thoracic region: Secondary | ICD-10-CM | POA: Diagnosis not present

## 2024-06-10 DIAGNOSIS — M9901 Segmental and somatic dysfunction of cervical region: Secondary | ICD-10-CM | POA: Diagnosis not present

## 2024-06-10 DIAGNOSIS — M4603 Spinal enthesopathy, cervicothoracic region: Secondary | ICD-10-CM | POA: Diagnosis not present

## 2024-06-10 DIAGNOSIS — M5387 Other specified dorsopathies, lumbosacral region: Secondary | ICD-10-CM | POA: Diagnosis not present

## 2024-06-10 DIAGNOSIS — M9904 Segmental and somatic dysfunction of sacral region: Secondary | ICD-10-CM | POA: Diagnosis not present

## 2024-06-10 DIAGNOSIS — M9906 Segmental and somatic dysfunction of lower extremity: Secondary | ICD-10-CM | POA: Diagnosis not present

## 2024-06-10 DIAGNOSIS — M9903 Segmental and somatic dysfunction of lumbar region: Secondary | ICD-10-CM | POA: Diagnosis not present

## 2024-06-13 DIAGNOSIS — M9904 Segmental and somatic dysfunction of sacral region: Secondary | ICD-10-CM | POA: Diagnosis not present

## 2024-06-13 DIAGNOSIS — M4603 Spinal enthesopathy, cervicothoracic region: Secondary | ICD-10-CM | POA: Diagnosis not present

## 2024-06-13 DIAGNOSIS — M9901 Segmental and somatic dysfunction of cervical region: Secondary | ICD-10-CM | POA: Diagnosis not present

## 2024-06-13 DIAGNOSIS — M5387 Other specified dorsopathies, lumbosacral region: Secondary | ICD-10-CM | POA: Diagnosis not present

## 2024-06-13 DIAGNOSIS — M9906 Segmental and somatic dysfunction of lower extremity: Secondary | ICD-10-CM | POA: Diagnosis not present

## 2024-06-13 DIAGNOSIS — M9902 Segmental and somatic dysfunction of thoracic region: Secondary | ICD-10-CM | POA: Diagnosis not present

## 2024-06-13 DIAGNOSIS — M5417 Radiculopathy, lumbosacral region: Secondary | ICD-10-CM | POA: Diagnosis not present

## 2024-06-13 DIAGNOSIS — M9903 Segmental and somatic dysfunction of lumbar region: Secondary | ICD-10-CM | POA: Diagnosis not present

## 2024-06-24 DIAGNOSIS — M5417 Radiculopathy, lumbosacral region: Secondary | ICD-10-CM | POA: Diagnosis not present

## 2024-06-24 DIAGNOSIS — M9901 Segmental and somatic dysfunction of cervical region: Secondary | ICD-10-CM | POA: Diagnosis not present

## 2024-06-24 DIAGNOSIS — M9904 Segmental and somatic dysfunction of sacral region: Secondary | ICD-10-CM | POA: Diagnosis not present

## 2024-06-24 DIAGNOSIS — M5387 Other specified dorsopathies, lumbosacral region: Secondary | ICD-10-CM | POA: Diagnosis not present

## 2024-06-24 DIAGNOSIS — M9903 Segmental and somatic dysfunction of lumbar region: Secondary | ICD-10-CM | POA: Diagnosis not present

## 2024-06-24 DIAGNOSIS — M9902 Segmental and somatic dysfunction of thoracic region: Secondary | ICD-10-CM | POA: Diagnosis not present

## 2024-06-24 DIAGNOSIS — M9906 Segmental and somatic dysfunction of lower extremity: Secondary | ICD-10-CM | POA: Diagnosis not present

## 2024-06-24 DIAGNOSIS — M4603 Spinal enthesopathy, cervicothoracic region: Secondary | ICD-10-CM | POA: Diagnosis not present

## 2024-06-27 DIAGNOSIS — M9904 Segmental and somatic dysfunction of sacral region: Secondary | ICD-10-CM | POA: Diagnosis not present

## 2024-06-27 DIAGNOSIS — M9906 Segmental and somatic dysfunction of lower extremity: Secondary | ICD-10-CM | POA: Diagnosis not present

## 2024-06-27 DIAGNOSIS — M9903 Segmental and somatic dysfunction of lumbar region: Secondary | ICD-10-CM | POA: Diagnosis not present

## 2024-06-27 DIAGNOSIS — M4603 Spinal enthesopathy, cervicothoracic region: Secondary | ICD-10-CM | POA: Diagnosis not present

## 2024-06-27 DIAGNOSIS — M9902 Segmental and somatic dysfunction of thoracic region: Secondary | ICD-10-CM | POA: Diagnosis not present

## 2024-06-27 DIAGNOSIS — M5417 Radiculopathy, lumbosacral region: Secondary | ICD-10-CM | POA: Diagnosis not present

## 2024-06-27 DIAGNOSIS — M9901 Segmental and somatic dysfunction of cervical region: Secondary | ICD-10-CM | POA: Diagnosis not present

## 2024-06-27 DIAGNOSIS — M5387 Other specified dorsopathies, lumbosacral region: Secondary | ICD-10-CM | POA: Diagnosis not present

## 2024-07-08 DIAGNOSIS — M5387 Other specified dorsopathies, lumbosacral region: Secondary | ICD-10-CM | POA: Diagnosis not present

## 2024-07-08 DIAGNOSIS — M9901 Segmental and somatic dysfunction of cervical region: Secondary | ICD-10-CM | POA: Diagnosis not present

## 2024-07-08 DIAGNOSIS — M9903 Segmental and somatic dysfunction of lumbar region: Secondary | ICD-10-CM | POA: Diagnosis not present

## 2024-07-08 DIAGNOSIS — M9906 Segmental and somatic dysfunction of lower extremity: Secondary | ICD-10-CM | POA: Diagnosis not present

## 2024-07-08 DIAGNOSIS — M4603 Spinal enthesopathy, cervicothoracic region: Secondary | ICD-10-CM | POA: Diagnosis not present

## 2024-07-08 DIAGNOSIS — M9902 Segmental and somatic dysfunction of thoracic region: Secondary | ICD-10-CM | POA: Diagnosis not present

## 2024-07-08 DIAGNOSIS — M9904 Segmental and somatic dysfunction of sacral region: Secondary | ICD-10-CM | POA: Diagnosis not present

## 2024-07-08 DIAGNOSIS — M5417 Radiculopathy, lumbosacral region: Secondary | ICD-10-CM | POA: Diagnosis not present

## 2024-07-17 ENCOUNTER — Other Ambulatory Visit: Payer: Self-pay | Admitting: Obstetrics and Gynecology

## 2024-07-17 DIAGNOSIS — Z1231 Encounter for screening mammogram for malignant neoplasm of breast: Secondary | ICD-10-CM

## 2024-07-31 DIAGNOSIS — H524 Presbyopia: Secondary | ICD-10-CM | POA: Diagnosis not present

## 2024-08-07 DIAGNOSIS — C4441 Basal cell carcinoma of skin of scalp and neck: Secondary | ICD-10-CM | POA: Diagnosis not present

## 2024-08-07 DIAGNOSIS — D485 Neoplasm of uncertain behavior of skin: Secondary | ICD-10-CM | POA: Diagnosis not present

## 2024-08-09 ENCOUNTER — Ambulatory Visit
Admission: RE | Admit: 2024-08-09 | Discharge: 2024-08-09 | Disposition: A | Discharge status: Home / Self Care | Source: Ambulatory Visit | Attending: Obstetrics and Gynecology | Admitting: Obstetrics and Gynecology

## 2024-08-09 DIAGNOSIS — Z1231 Encounter for screening mammogram for malignant neoplasm of breast: Secondary | ICD-10-CM | POA: Diagnosis not present

## 2024-10-15 DIAGNOSIS — M9904 Segmental and somatic dysfunction of sacral region: Secondary | ICD-10-CM | POA: Diagnosis not present

## 2024-10-15 DIAGNOSIS — M9906 Segmental and somatic dysfunction of lower extremity: Secondary | ICD-10-CM | POA: Diagnosis not present

## 2024-10-15 DIAGNOSIS — M9901 Segmental and somatic dysfunction of cervical region: Secondary | ICD-10-CM | POA: Diagnosis not present

## 2024-10-15 DIAGNOSIS — M4603 Spinal enthesopathy, cervicothoracic region: Secondary | ICD-10-CM | POA: Diagnosis not present

## 2024-10-15 DIAGNOSIS — M9903 Segmental and somatic dysfunction of lumbar region: Secondary | ICD-10-CM | POA: Diagnosis not present

## 2024-10-15 DIAGNOSIS — M5387 Other specified dorsopathies, lumbosacral region: Secondary | ICD-10-CM | POA: Diagnosis not present

## 2024-10-15 DIAGNOSIS — M9902 Segmental and somatic dysfunction of thoracic region: Secondary | ICD-10-CM | POA: Diagnosis not present

## 2024-10-15 DIAGNOSIS — M5417 Radiculopathy, lumbosacral region: Secondary | ICD-10-CM | POA: Diagnosis not present

## 2024-10-23 DIAGNOSIS — R051 Acute cough: Secondary | ICD-10-CM | POA: Diagnosis not present

## 2024-10-23 DIAGNOSIS — J0140 Acute pansinusitis, unspecified: Secondary | ICD-10-CM | POA: Diagnosis not present

## 2024-10-23 DIAGNOSIS — Z6824 Body mass index (BMI) 24.0-24.9, adult: Secondary | ICD-10-CM | POA: Diagnosis not present

## 2024-11-05 ENCOUNTER — Ambulatory Visit: Payer: Self-pay | Admitting: Physician Assistant

## 2024-11-05 ENCOUNTER — Encounter: Payer: Self-pay | Admitting: Physician Assistant

## 2024-11-05 VITALS — BP 111/73 | HR 94 | Temp 98.2°F | Resp 14 | Ht 64.0 in | Wt 149.0 lb

## 2024-11-05 DIAGNOSIS — J0100 Acute maxillary sinusitis, unspecified: Secondary | ICD-10-CM

## 2024-11-05 MED ORDER — CLARITHROMYCIN 500 MG PO TABS: 500.0000 mg | ORAL_TABLET | Freq: Two times a day (BID) | ORAL | 0 refills | Status: AC

## 2024-11-05 MED ORDER — PSEUDOEPH-BROMPHEN-DM 30-2-10 MG/5ML PO SYRP: 5.0000 mL | ORAL_SOLUTION | Freq: Four times a day (QID) | ORAL | 0 refills | Status: AC | PRN

## 2024-11-05 NOTE — Progress Notes (Signed)
° °  Subjective: Cough and facial congestion    Patient ID: TUNISIA LANDGREBE, female    DOB: [birth year], 43 y.o.   MRN: [id number]  HPI Patient presents with 2 to 3 weeks of nonproductive cough, facial pain, and chest tightness.  Patient was seen 10 days ago and prescribed Tessalon  Perles at 100 mg 3 times daily and a Z-Pak.  Patient states she started feeling better 2 to 3 days after taking medication but symptoms returned.  Denies fever/chills.  Denies recent travel or known contact with COVID-19.   Review of Systems Exercise-induced asthma, seasonal rhinitis, hyperlipidemia, and migraine headaches.    Objective:   Physical Exam BP 111/73  BP Location Left Arm  Patient Position Sitting  Cuff Size Normal  Pulse Rate 94  Temp 98.2 F (36.8 C)  Temp Source Temporal  Weight 149 lb (67.6 kg)  Height 5' 4 (1.626 m)  Resp 14  SpO2 100 %   BMI: 25.58 kg/m2  BSA: 1.75 m2  HEENT is remarkable for ethmoid and maxillary sinus guarding.  Copious postnasal drainage. Neck is supple for lymphadenopathy or bruits. Lungs are clear to auscultation. Heart regular rate and rhythm.       Assessment & Plan: Subacute maxillary sinusitis   Patient given a prescription for Biaxin  and Bromfed-DM.  Advised to follow-up in 1 week if no improvement or worsening complaints.

## 2024-11-05 NOTE — Progress Notes (Signed)
 S/Sx x2 - 3 weeks: Cough - dry (occ productive - no a whole lot of color) - hasn't gone away Facial pain/pressure No ear discomfort Headache Denies fever Chest tightness     Virtual appointment 2 weeks ago & prescribed a Zpack & completed it 10 days ago  Currently taking Tessalon  pearls to help with cough No taking any antihistamines or decongestants

## 2024-11-06 ENCOUNTER — Other Ambulatory Visit: Payer: Self-pay | Admitting: Physician Assistant
